# Patient Record
Sex: Male | Born: 1955
Health system: Southern US, Community
[De-identification: ages and names within clinical notes are randomized; demographics above are authoritative.]

## PROBLEM LIST (undated history)

## (undated) DIAGNOSIS — K635 Polyp of colon: Secondary | ICD-10-CM

## (undated) DIAGNOSIS — Z8489 Family history of other specified conditions: Secondary | ICD-10-CM

## (undated) DIAGNOSIS — M1990 Unspecified osteoarthritis, unspecified site: Secondary | ICD-10-CM

## (undated) DIAGNOSIS — N2889 Other specified disorders of kidney and ureter: Secondary | ICD-10-CM

## (undated) DIAGNOSIS — R319 Hematuria, unspecified: Secondary | ICD-10-CM

## (undated) DIAGNOSIS — I10 Essential (primary) hypertension: Secondary | ICD-10-CM

## (undated) DIAGNOSIS — E785 Hyperlipidemia, unspecified: Secondary | ICD-10-CM

## (undated) DIAGNOSIS — H269 Unspecified cataract: Secondary | ICD-10-CM

## (undated) DIAGNOSIS — F419 Anxiety disorder, unspecified: Secondary | ICD-10-CM

## (undated) DIAGNOSIS — L719 Rosacea, unspecified: Secondary | ICD-10-CM

## (undated) DIAGNOSIS — F32A Depression, unspecified: Secondary | ICD-10-CM

## (undated) DIAGNOSIS — K219 Gastro-esophageal reflux disease without esophagitis: Secondary | ICD-10-CM

## (undated) DIAGNOSIS — Z9889 Other specified postprocedural states: Secondary | ICD-10-CM

## (undated) DIAGNOSIS — C801 Malignant (primary) neoplasm, unspecified: Secondary | ICD-10-CM

## (undated) DIAGNOSIS — T7840XA Allergy, unspecified, initial encounter: Secondary | ICD-10-CM

## (undated) DIAGNOSIS — R112 Nausea with vomiting, unspecified: Secondary | ICD-10-CM

## (undated) DIAGNOSIS — K589 Irritable bowel syndrome without diarrhea: Secondary | ICD-10-CM

## (undated) HISTORY — DX: Rosacea, unspecified: L71.9

## (undated) HISTORY — DX: Hyperlipidemia, unspecified: E78.5

## (undated) HISTORY — DX: Other specified disorders of kidney and ureter: N28.89

## (undated) HISTORY — DX: Gastro-esophageal reflux disease without esophagitis: K21.9

## (undated) HISTORY — DX: Allergy, unspecified, initial encounter: T78.40XA

## (undated) HISTORY — DX: Polyp of colon: K63.5

## (undated) HISTORY — DX: Anxiety disorder, unspecified: F41.9

## (undated) HISTORY — DX: Irritable bowel syndrome, unspecified: K58.9

## (undated) HISTORY — DX: Unspecified cataract: H26.9

## (undated) HISTORY — DX: Hematuria, unspecified: R31.9

## (undated) HISTORY — PX: OTHER SURGICAL HISTORY: SHX169

---

## 1999-12-10 ENCOUNTER — Encounter: Admission: RE | Admit: 1999-12-10 | Discharge: 1999-12-10 | Payer: Self-pay | Admitting: Neurosurgery

## 1999-12-10 ENCOUNTER — Encounter: Payer: Self-pay | Admitting: Neurosurgery

## 2000-01-02 ENCOUNTER — Encounter: Payer: Self-pay | Admitting: Neurosurgery

## 2000-01-02 ENCOUNTER — Ambulatory Visit (HOSPITAL_COMMUNITY): Admission: RE | Admit: 2000-01-02 | Discharge: 2000-01-02 | Payer: Self-pay | Admitting: Neurosurgery

## 2003-05-31 ENCOUNTER — Emergency Department (HOSPITAL_COMMUNITY): Admission: EM | Admit: 2003-05-31 | Discharge: 2003-05-31 | Payer: Self-pay

## 2005-03-16 ENCOUNTER — Ambulatory Visit: Payer: Self-pay | Admitting: Internal Medicine

## 2005-03-23 ENCOUNTER — Ambulatory Visit: Payer: Self-pay | Admitting: Internal Medicine

## 2005-05-24 ENCOUNTER — Ambulatory Visit: Payer: Self-pay | Admitting: Internal Medicine

## 2006-06-19 ENCOUNTER — Ambulatory Visit: Payer: Self-pay | Admitting: Internal Medicine

## 2006-06-19 LAB — CONVERTED CEMR LAB
ALT: 26 units/L (ref 0–40)
AST: 26 units/L (ref 0–37)
Albumin: 4.1 g/dL (ref 3.5–5.2)
Alkaline Phosphatase: 52 units/L (ref 39–117)
BUN: 20 mg/dL (ref 6–23)
Basophils Absolute: 0 10*3/uL (ref 0.0–0.1)
Basophils Relative: 0.2 % (ref 0.0–1.0)
CO2: 26 meq/L (ref 19–32)
Calcium: 9.3 mg/dL (ref 8.4–10.5)
Chloride: 107 meq/L (ref 96–112)
Chol/HDL Ratio, serum: 4.3
Cholesterol: 187 mg/dL (ref 0–200)
Creatinine, Ser: 1 mg/dL (ref 0.4–1.5)
Eosinophil percent: 3 % (ref 0.0–5.0)
GFR calc non Af Amer: 84 mL/min
Glomerular Filtration Rate, Af Am: 101 mL/min/{1.73_m2}
Glucose, Bld: 97 mg/dL (ref 70–99)
HCT: 50.3 % (ref 39.0–52.0)
HDL: 43.7 mg/dL (ref >39.0)
Hemoglobin: 16.9 g/dL (ref 13.0–17.0)
LDL Cholesterol: 126 mg/dL — ABNORMAL HIGH (ref 0–99)
Lymphocytes Relative: 33.8 % (ref 12.0–46.0)
MCHC: 33.6 g/dL (ref 30.0–36.0)
MCV: 89.7 fL (ref 78.0–100.0)
Monocytes Absolute: 0.7 10*3/uL (ref 0.2–0.7)
Monocytes Relative: 9.6 % (ref 3.0–11.0)
Neutro Abs: 3.8 10*3/uL (ref 1.4–7.7)
Neutrophils Relative %: 53.4 % (ref 43.0–77.0)
PSA: 0.63 ng/mL (ref 0.10–4.00)
Platelets: 275 10*3/uL (ref 150–400)
Potassium: 3.3 meq/L — ABNORMAL LOW (ref 3.5–5.1)
RBC: 5.61 M/uL (ref 4.22–5.81)
RDW: 12.7 % (ref 11.5–14.6)
Sodium: 139 meq/L (ref 135–145)
TSH: 2.25 microintl units/mL (ref 0.35–5.50)
Total Bilirubin: 1.1 mg/dL (ref 0.3–1.2)
Total Protein: 6.7 g/dL (ref 6.0–8.3)
Triglyceride fasting, serum: 87 mg/dL (ref 0–149)
VLDL: 17 mg/dL (ref 0–40)
WBC: 7.1 10*3/uL (ref 4.5–10.5)

## 2006-06-29 ENCOUNTER — Ambulatory Visit: Payer: Self-pay | Admitting: Internal Medicine

## 2006-07-17 ENCOUNTER — Ambulatory Visit: Payer: Self-pay | Admitting: Gastroenterology

## 2006-07-27 LAB — HM COLONOSCOPY

## 2006-07-28 ENCOUNTER — Encounter: Payer: Self-pay | Admitting: Internal Medicine

## 2006-07-28 ENCOUNTER — Ambulatory Visit: Payer: Self-pay | Admitting: Gastroenterology

## 2006-07-28 DIAGNOSIS — K648 Other hemorrhoids: Secondary | ICD-10-CM | POA: Insufficient documentation

## 2006-07-28 HISTORY — PX: COLONOSCOPY: SHX174

## 2006-11-07 ENCOUNTER — Ambulatory Visit: Payer: Self-pay | Admitting: Internal Medicine

## 2006-11-16 ENCOUNTER — Ambulatory Visit: Payer: Self-pay | Admitting: Gastroenterology

## 2006-12-01 ENCOUNTER — Ambulatory Visit: Payer: Self-pay | Admitting: Internal Medicine

## 2007-01-01 ENCOUNTER — Ambulatory Visit: Payer: Self-pay | Admitting: Gastroenterology

## 2007-10-19 ENCOUNTER — Ambulatory Visit: Payer: Self-pay | Admitting: Internal Medicine

## 2007-10-19 LAB — CONVERTED CEMR LAB
ALT: 15 units/L (ref 0–53)
AST: 19 units/L (ref 0–37)
Albumin: 4.4 g/dL (ref 3.5–5.2)
Alkaline Phosphatase: 60 units/L (ref 39–117)
BUN: 19 mg/dL (ref 6–23)
Basophils Absolute: 0.1 10*3/uL (ref 0.0–0.1)
Basophils Relative: 0.9 % (ref 0.0–1.0)
Bilirubin Urine: NEGATIVE
Bilirubin, Direct: 0.3 mg/dL (ref 0.0–0.3)
CO2: 27 meq/L (ref 19–32)
Calcium: 10.2 mg/dL (ref 8.4–10.5)
Chloride: 105 meq/L (ref 96–112)
Cholesterol: 195 mg/dL (ref 0–200)
Creatinine, Ser: 1 mg/dL (ref 0.4–1.5)
Eosinophils Absolute: 0.1 10*3/uL (ref 0.0–0.6)
Eosinophils Relative: 1.3 % (ref 0.0–5.0)
GFR calc Af Amer: 101 mL/min
GFR calc non Af Amer: 83 mL/min
Glucose, Bld: 99 mg/dL (ref 70–99)
Glucose, Urine, Semiquant: NEGATIVE
HCT: 52.9 % — ABNORMAL HIGH (ref 39.0–52.0)
HDL: 41.5 mg/dL (ref >39.0)
Hemoglobin: 17.5 g/dL — ABNORMAL HIGH (ref 13.0–17.0)
Ketones, urine, test strip: NEGATIVE
LDL Cholesterol: 133 mg/dL — ABNORMAL HIGH (ref 0–99)
Lymphocytes Relative: 33.2 % (ref 12.0–46.0)
MCHC: 33.1 g/dL (ref 30.0–36.0)
MCV: 89.6 fL (ref 78.0–100.0)
Monocytes Absolute: 0.7 10*3/uL (ref 0.2–0.7)
Monocytes Relative: 8.8 % (ref 3.0–11.0)
Neutro Abs: 4.5 10*3/uL (ref 1.4–7.7)
Neutrophils Relative %: 55.8 % (ref 43.0–77.0)
Nitrite: NEGATIVE
PSA: 0.56 ng/mL (ref 0.10–4.00)
Platelets: 252 10*3/uL (ref 150–400)
Potassium: 4.4 meq/L (ref 3.5–5.1)
Protein, U semiquant: NEGATIVE
RBC: 5.9 M/uL — ABNORMAL HIGH (ref 4.22–5.81)
RDW: 12.7 % (ref 11.5–14.6)
Sodium: 137 meq/L (ref 135–145)
Specific Gravity, Urine: 1.02
TSH: 2.23 microintl units/mL (ref 0.35–5.50)
Total Bilirubin: 0.9 mg/dL (ref 0.3–1.2)
Total CHOL/HDL Ratio: 4.7
Total Protein: 6.5 g/dL (ref 6.0–8.3)
Triglycerides: 104 mg/dL (ref 0–149)
Urobilinogen, UA: 0.2
VLDL: 21 mg/dL (ref 0–40)
WBC Urine, dipstick: NEGATIVE
WBC: 8.1 10*3/uL (ref 4.5–10.5)
pH: 5.5

## 2007-10-26 ENCOUNTER — Ambulatory Visit: Payer: Self-pay | Admitting: Internal Medicine

## 2007-10-26 DIAGNOSIS — B079 Viral wart, unspecified: Secondary | ICD-10-CM | POA: Insufficient documentation

## 2007-10-26 DIAGNOSIS — E785 Hyperlipidemia, unspecified: Secondary | ICD-10-CM | POA: Insufficient documentation

## 2007-10-26 DIAGNOSIS — L719 Rosacea, unspecified: Secondary | ICD-10-CM | POA: Insufficient documentation

## 2007-10-26 LAB — CONVERTED CEMR LAB
Cholesterol, target level: 200 mg/dL
HDL goal, serum: 40 mg/dL
LDL Goal: 160 mg/dL

## 2007-12-27 DIAGNOSIS — F411 Generalized anxiety disorder: Secondary | ICD-10-CM | POA: Insufficient documentation

## 2007-12-27 DIAGNOSIS — K219 Gastro-esophageal reflux disease without esophagitis: Secondary | ICD-10-CM | POA: Insufficient documentation

## 2007-12-27 DIAGNOSIS — J45909 Unspecified asthma, uncomplicated: Secondary | ICD-10-CM | POA: Insufficient documentation

## 2008-01-25 ENCOUNTER — Ambulatory Visit: Payer: Self-pay | Admitting: Internal Medicine

## 2008-01-25 DIAGNOSIS — T887XXA Unspecified adverse effect of drug or medicament, initial encounter: Secondary | ICD-10-CM | POA: Insufficient documentation

## 2008-01-25 LAB — CONVERTED CEMR LAB
ALT: 17 units/L (ref 0–53)
AST: 20 units/L (ref 0–37)
Albumin: 4 g/dL (ref 3.5–5.2)
Alkaline Phosphatase: 49 units/L (ref 39–117)
Bilirubin, Direct: 0.1 mg/dL (ref 0.0–0.3)
Cholesterol: 182 mg/dL (ref 0–200)
HDL: 39.3 mg/dL (ref >39.0)
LDL Cholesterol: 127 mg/dL — ABNORMAL HIGH (ref 0–99)
Total Bilirubin: 1 mg/dL (ref 0.3–1.2)
Total CHOL/HDL Ratio: 4.6
Total Protein: 6.5 g/dL (ref 6.0–8.3)
Triglycerides: 78 mg/dL (ref 0–149)
VLDL: 16 mg/dL (ref 0–40)

## 2008-01-28 ENCOUNTER — Ambulatory Visit: Payer: Self-pay | Admitting: Internal Medicine

## 2008-03-07 ENCOUNTER — Telehealth: Payer: Self-pay | Admitting: Gastroenterology

## 2008-04-16 DIAGNOSIS — Z86010 Personal history of colonic polyps: Secondary | ICD-10-CM | POA: Insufficient documentation

## 2008-04-17 ENCOUNTER — Ambulatory Visit: Payer: Self-pay | Admitting: Gastroenterology

## 2008-04-17 DIAGNOSIS — K644 Residual hemorrhoidal skin tags: Secondary | ICD-10-CM | POA: Insufficient documentation

## 2008-04-17 DIAGNOSIS — K589 Irritable bowel syndrome without diarrhea: Secondary | ICD-10-CM | POA: Insufficient documentation

## 2008-05-16 ENCOUNTER — Telehealth: Payer: Self-pay | Admitting: Internal Medicine

## 2008-05-21 ENCOUNTER — Telehealth: Payer: Self-pay | Admitting: Internal Medicine

## 2008-10-22 ENCOUNTER — Encounter: Payer: Self-pay | Admitting: Internal Medicine

## 2008-10-22 ENCOUNTER — Encounter: Admission: RE | Admit: 2008-10-22 | Discharge: 2008-10-22 | Payer: Self-pay | Admitting: Neurosurgery

## 2008-10-23 ENCOUNTER — Encounter: Payer: Self-pay | Admitting: Internal Medicine

## 2008-11-10 ENCOUNTER — Telehealth: Payer: Self-pay | Admitting: Internal Medicine

## 2008-11-17 ENCOUNTER — Telehealth (INDEPENDENT_AMBULATORY_CARE_PROVIDER_SITE_OTHER): Payer: Self-pay | Admitting: *Deleted

## 2008-11-18 ENCOUNTER — Ambulatory Visit: Payer: Self-pay | Admitting: Internal Medicine

## 2008-11-18 LAB — CONVERTED CEMR LAB
Bilirubin Urine: NEGATIVE
Glucose, Urine, Semiquant: NEGATIVE
Ketones, urine, test strip: NEGATIVE
Nitrite: NEGATIVE
Protein, U semiquant: NEGATIVE
Specific Gravity, Urine: 1.015
Urobilinogen, UA: 0.2
WBC Urine, dipstick: NEGATIVE
pH: 6

## 2008-12-02 ENCOUNTER — Ambulatory Visit: Payer: Self-pay | Admitting: Internal Medicine

## 2008-12-02 LAB — CONVERTED CEMR LAB
ALT: 27 units/L (ref 0–53)
AST: 22 units/L (ref 0–37)
Albumin: 4.3 g/dL (ref 3.5–5.2)
Alkaline Phosphatase: 65 units/L (ref 39–117)
BUN: 17 mg/dL (ref 6–23)
Basophils Absolute: 0 10*3/uL (ref 0.0–0.1)
Basophils Relative: 0.1 % (ref 0.0–3.0)
Bilirubin, Direct: 0.1 mg/dL (ref 0.0–0.3)
CO2: 26 meq/L (ref 19–32)
Calcium: 10 mg/dL (ref 8.4–10.5)
Chloride: 108 meq/L (ref 96–112)
Cholesterol: 230 mg/dL — ABNORMAL HIGH (ref 0–200)
Creatinine, Ser: 0.9 mg/dL (ref 0.4–1.5)
Direct LDL: 152.5 mg/dL
Eosinophils Absolute: 0 10*3/uL (ref 0.0–0.7)
Eosinophils Relative: 0.3 % (ref 0.0–5.0)
GFR calc non Af Amer: 93.51 mL/min (ref >60)
Glucose, Bld: 105 mg/dL — ABNORMAL HIGH (ref 70–99)
HCT: 52 % (ref 39.0–52.0)
HDL: 51.5 mg/dL (ref >39.00)
Hemoglobin: 18.1 g/dL — ABNORMAL HIGH (ref 13.0–17.0)
Lymphocytes Relative: 18.1 % (ref 12.0–46.0)
Lymphs Abs: 2.4 10*3/uL (ref 0.7–4.0)
MCHC: 34.9 g/dL (ref 30.0–36.0)
MCV: 88.9 fL (ref 78.0–100.0)
Monocytes Absolute: 0.5 10*3/uL (ref 0.1–1.0)
Monocytes Relative: 3.8 % (ref 3.0–12.0)
Neutro Abs: 10.1 10*3/uL — ABNORMAL HIGH (ref 1.4–7.7)
Neutrophils Relative %: 77.7 % — ABNORMAL HIGH (ref 43.0–77.0)
PSA: 0.7 ng/mL (ref 0.10–4.00)
Platelets: 262 10*3/uL (ref 150.0–400.0)
Potassium: 3.6 meq/L (ref 3.5–5.1)
RBC: 5.85 M/uL — ABNORMAL HIGH (ref 4.22–5.81)
RDW: 12.4 % (ref 11.5–14.6)
Sodium: 141 meq/L (ref 135–145)
TSH: 1.51 microintl units/mL (ref 0.35–5.50)
Total Bilirubin: 0.9 mg/dL (ref 0.3–1.2)
Total CHOL/HDL Ratio: 4
Total Protein: 7.8 g/dL (ref 6.0–8.3)
Triglycerides: 55 mg/dL (ref 0.0–149.0)
VLDL: 11 mg/dL (ref 0.0–40.0)
WBC: 13 10*3/uL — ABNORMAL HIGH (ref 4.5–10.5)

## 2009-03-03 ENCOUNTER — Ambulatory Visit: Payer: Self-pay | Admitting: Internal Medicine

## 2009-03-03 LAB — CONVERTED CEMR LAB
ALT: 15 units/L (ref 0–53)
AST: 18 units/L (ref 0–37)
Albumin: 4 g/dL (ref 3.5–5.2)
Alkaline Phosphatase: 49 units/L (ref 39–117)
Bilirubin, Direct: 0.1 mg/dL (ref 0.0–0.3)
Cholesterol: 142 mg/dL (ref 0–200)
HDL: 37.5 mg/dL — ABNORMAL LOW (ref >39.00)
LDL Cholesterol: 83 mg/dL (ref 0–99)
Total Bilirubin: 1 mg/dL (ref 0.3–1.2)
Total CHOL/HDL Ratio: 4
Total Protein: 6.7 g/dL (ref 6.0–8.3)
Triglycerides: 109 mg/dL (ref 0.0–149.0)
VLDL: 21.8 mg/dL (ref 0.0–40.0)

## 2009-03-10 ENCOUNTER — Ambulatory Visit: Payer: Self-pay | Admitting: Internal Medicine

## 2009-09-21 ENCOUNTER — Telehealth: Payer: Self-pay | Admitting: Gastroenterology

## 2009-10-22 ENCOUNTER — Ambulatory Visit: Payer: Self-pay | Admitting: Gastroenterology

## 2009-11-24 ENCOUNTER — Telehealth: Payer: Self-pay | Admitting: Physician Assistant

## 2009-11-26 ENCOUNTER — Telehealth: Payer: Self-pay | Admitting: Internal Medicine

## 2009-12-09 ENCOUNTER — Ambulatory Visit: Payer: Self-pay | Admitting: Internal Medicine

## 2009-12-09 LAB — CONVERTED CEMR LAB
ALT: 20 units/L (ref 0–53)
AST: 23 units/L (ref 0–37)
Albumin: 4.4 g/dL (ref 3.5–5.2)
Alkaline Phosphatase: 48 units/L (ref 39–117)
BUN: 16 mg/dL (ref 6–23)
Basophils Absolute: 0 10*3/uL (ref 0.0–0.1)
Basophils Relative: 0.6 % (ref 0.0–3.0)
Bilirubin Urine: NEGATIVE
Bilirubin, Direct: 0.1 mg/dL (ref 0.0–0.3)
CO2: 27 meq/L (ref 19–32)
Calcium: 9.9 mg/dL (ref 8.4–10.5)
Chloride: 104 meq/L (ref 96–112)
Cholesterol: 154 mg/dL (ref 0–200)
Creatinine, Ser: 1 mg/dL (ref 0.4–1.5)
Eosinophils Absolute: 0.1 10*3/uL (ref 0.0–0.7)
Eosinophils Relative: 1.7 % (ref 0.0–5.0)
GFR calc non Af Amer: 83.44 mL/min (ref >60)
Glucose, Bld: 85 mg/dL (ref 70–99)
Glucose, Urine, Semiquant: NEGATIVE
HCT: 49.3 % (ref 39.0–52.0)
HDL: 48.1 mg/dL (ref >39.00)
Hemoglobin: 16.7 g/dL (ref 13.0–17.0)
Ketones, urine, test strip: NEGATIVE
LDL Cholesterol: 93 mg/dL (ref 0–99)
Lymphocytes Relative: 34.6 % (ref 12.0–46.0)
Lymphs Abs: 2.4 10*3/uL (ref 0.7–4.0)
MCHC: 33.9 g/dL (ref 30.0–36.0)
MCV: 91 fL (ref 78.0–100.0)
Monocytes Absolute: 0.6 10*3/uL (ref 0.1–1.0)
Monocytes Relative: 8.5 % (ref 3.0–12.0)
Neutro Abs: 3.8 10*3/uL (ref 1.4–7.7)
Neutrophils Relative %: 54.6 % (ref 43.0–77.0)
Nitrite: NEGATIVE
PSA: 0.71 ng/mL (ref 0.10–4.00)
Platelets: 263 10*3/uL (ref 150.0–400.0)
Potassium: 3.8 meq/L (ref 3.5–5.1)
Protein, U semiquant: NEGATIVE
RBC: 5.42 M/uL (ref 4.22–5.81)
RDW: 13.7 % (ref 11.5–14.6)
Sodium: 142 meq/L (ref 135–145)
Specific Gravity, Urine: 1.015
TSH: 1.66 microintl units/mL (ref 0.35–5.50)
Total Bilirubin: 0.9 mg/dL (ref 0.3–1.2)
Total CHOL/HDL Ratio: 3
Total Protein: 7 g/dL (ref 6.0–8.3)
Triglycerides: 65 mg/dL (ref 0.0–149.0)
Urobilinogen, UA: 0.2
VLDL: 13 mg/dL (ref 0.0–40.0)
WBC Urine, dipstick: NEGATIVE
WBC: 6.9 10*3/uL (ref 4.5–10.5)
pH: 5.5

## 2009-12-16 ENCOUNTER — Ambulatory Visit: Payer: Self-pay | Admitting: Internal Medicine

## 2010-01-27 ENCOUNTER — Telehealth: Payer: Self-pay | Admitting: Internal Medicine

## 2010-03-15 ENCOUNTER — Telehealth: Payer: Self-pay | Admitting: Internal Medicine

## 2010-04-20 ENCOUNTER — Ambulatory Visit: Payer: Self-pay | Admitting: Internal Medicine

## 2010-05-04 DIAGNOSIS — R030 Elevated blood-pressure reading, without diagnosis of hypertension: Secondary | ICD-10-CM | POA: Insufficient documentation

## 2010-09-02 NOTE — Assessment & Plan Note (Signed)
Summary: 3 month fup//ccm/pt rescd//ccm   Vital Signs:  Patient profile:   55 year old male Height:      69 inches Weight:      158 pounds BMI:     23.42 Temp:     98.2 degrees F oral Pulse rate:   84 / minute Resp:     14 per minute BP sitting:   136 / 80  (left arm)  Vitals Entered By: Willy Eddy, LPN (April 20, 2010 12:04 PM) CC: roa Is Patient Diabetic? No   Primary Care Illianna Paschal:  Darryll Capers MD  CC:  roa.  History of Present Illness: the anal pram works for the hemorhoids the pt has acute allergic rhinitis with sinus congestion he has used saline and mucinex the pt has used amitiza effectively for IBS  palpitations are rare as well ans no recurrent chest pain stress management better hs of stress related elevatiosn of blood pressure  Preventive Screening-Counseling & Management  Alcohol-Tobacco     Smoking Status: never     Tobacco Counseling: not indicated; no tobacco use  Problems Prior to Update: 1)  Screening Colorectal-cancer  (ICD-V76.51) 2)  Irritable Bowel Syndrome  (ICD-564.1) 3)  Hemorrhoids-external  (ICD-455.3) 4)  Hyperlipidemia  (ICD-272.4) 5)  Uns Advrs Eff Uns Rx Medicinal&biological Sbstnc  (ICD-995.20) 6)  Colonic Polyps, Hx of  (ICD-V12.72) 7)  Internal Hemorrhoids  (ICD-455.0) 8)  Gerd  (ICD-530.81) 9)  Anxiety  (ICD-300.00) 10)  Asthma  (ICD-493.90) 11)  Wart, Left Hand  (ICD-078.10) 12)  Family History of Cad Male 1st Degree Relative <50  (ICD-V17.3) 13)  Hyperlipidemia  (ICD-272.4) 14)  Rosacea  (ICD-695.3) 15)  Physical Examination  (ICD-V70.0)  Current Problems (verified): 1)  Screening Colorectal-cancer  (ICD-V76.51) 2)  Irritable Bowel Syndrome  (ICD-564.1) 3)  Hemorrhoids-external  (ICD-455.3) 4)  Hyperlipidemia  (ICD-272.4) 5)  Uns Advrs Eff Uns Rx Medicinal&biological Sbstnc  (ICD-995.20) 6)  Colonic Polyps, Hx of  (ICD-V12.72) 7)  Internal Hemorrhoids  (ICD-455.0) 8)  Gerd  (ICD-530.81) 9)  Anxiety   (ICD-300.00) 10)  Asthma  (ICD-493.90) 11)  Wart, Left Hand  (ICD-078.10) 12)  Family History of Cad Male 1st Degree Relative <50  (ICD-V17.3) 13)  Hyperlipidemia  (ICD-272.4) 14)  Rosacea  (ICD-695.3) 15)  Physical Examination  (ICD-V70.0)  Medications Prior to Update: 1)  Adult Aspirin Low Strength 81 Mg  Tbdp (Aspirin) .... Once Daily 2)  Citrucel 500 Mg  Tabs (Methylcellulose (Laxative)) .... Two Times A Day 3)  Multivitamins   Caps (Multiple Vitamin) .... Once Daily 4)  Minocycline Hcl 100 Mg  Caps (Minocycline Hcl) .... Once Daily As Needed 5)  Elidel 1 %  Crea (Pimecrolimus) .... As Needed 6)  Fish Oil Concentrate 1000 Mg  Caps (Omega-3 Fatty Acids) .... 2 By Mouth Once Daily 7)  Canasa 1000 Mg  Supp (Mesalamine) .... One Suppository Into Rectum At Bedtime As Needed 8)  Analpram-Hc 1-2.5 % Crea (Hydrocortisone Ace-Pramoxine) .... Apply To Rectum Three Times A Day As Needed 9)  Robinul-Forte 2 Mg Tabs (Glycopyrrolate) .Marland Kitchen.. 1 Tab By Mouth Two Times A Day As Needed 10)  Alprazolam 0.25 Mg Tabs (Alprazolam) .... One Tab By Mouth Every 6 Hours As Needed 11)  Crestor 20 Mg Tabs (Rosuvastatin Calcium) .... One By Mouth Every Friday 12)  Amitiza 8 Mcg Caps (Lubiprostone) .... One By Mouth Daily  Current Medications (verified): 1)  Adult Aspirin Low Strength 81 Mg  Tbdp (Aspirin) .... Once Daily 2)  Citrucel 500 Mg  Tabs (Methylcellulose (Laxative)) .... Two Times A Day 3)  Multivitamins   Caps (Multiple Vitamin) .... Once Daily 4)  Minocycline Hcl 100 Mg  Caps (Minocycline Hcl) .... Once Daily As Needed 5)  Elidel 1 %  Crea (Pimecrolimus) .... As Needed 6)  Fish Oil Concentrate 1000 Mg  Caps (Omega-3 Fatty Acids) .... 2 By Mouth Once Daily 7)  Canasa 1000 Mg  Supp (Mesalamine) .... One Suppository Into Rectum At Bedtime As Needed 8)  Analpram-Hc 1-2.5 % Crea (Hydrocortisone Ace-Pramoxine) .... Apply To Rectum Three Times A Day As Needed 9)  Robinul-Forte 2 Mg Tabs (Glycopyrrolate) .Marland Kitchen..  1 Tab By Mouth Two Times A Day As Needed 10)  Alprazolam 0.25 Mg Tabs (Alprazolam) .... One Tab By Mouth Every 6 Hours As Needed 11)  Crestor 20 Mg Tabs (Rosuvastatin Calcium) .... One By Mouth Every Friday 12)  Amitiza 8 Mcg Caps (Lubiprostone) .... One By Mouth Daily 13)  Align 4 Mg Caps (Probiotic Product) .Marland Kitchen.. 1 Once Daily 14)  Lodrane 12d 6-45 Mg Xr12h-Tab (Brompheniramine-Pseudoeph) .Marland Kitchen.. 1 Two Times A Day  Allergies (verified): 1)  ! Pcn  Past History:  Family History: Last updated: 10/22/2009 Family History of CAD Male 1st degree relative <50 Family History of Stomach Cancer:Maternal Uncles x 2  No FH of Colon Cancer:  Social History: Last updated: 10/22/2009 Married Never Smoked Alcohol use-no Drug use-no Regular exercise-yes Daily Caffeine Use  Risk Factors: Exercise: yes (01/28/2008)  Risk Factors: Smoking Status: never (04/20/2010)  Past medical, surgical, family and social histories (including risk factors) reviewed, and no changes noted (except as noted below).  Past Medical History: Reviewed history from 04/17/2008 and no changes required. HYPERPLASTC COLONIC POLYPS  INTERNAL and EXTERNAL HEMORRHOIDS GERD (ICD-530.81) ANXIETY (ICD-300.00) ASTHMA (ICD-493.90) WART, LEFT HAND (ICD-078.10) HYPERLIPIDEMIA (ICD-272.4) ROSACEA (ICD-695.3)  Past Surgical History: Reviewed history from 04/17/2008 and no changes required. Unremarkable  Family History: Reviewed history from 10/22/2009 and no changes required. Family History of CAD Male 1st degree relative <50 Family History of Stomach Cancer:Maternal Uncles x 2  No FH of Colon Cancer:  Social History: Reviewed history from 10/22/2009 and no changes required. Married Never Smoked Alcohol use-no Drug use-no Regular exercise-yes Daily Caffeine Use  Review of Systems  The patient denies anorexia, fever, weight loss, weight gain, vision loss, decreased hearing, hoarseness, chest pain, syncope, dyspnea  on exertion, peripheral edema, prolonged cough, headaches, hemoptysis, abdominal pain, melena, hematochezia, severe indigestion/heartburn, hematuria, incontinence, genital sores, muscle weakness, suspicious skin lesions, transient blindness, difficulty walking, depression, unusual weight change, abnormal bleeding, enlarged lymph nodes, angioedema, breast masses, and testicular masses.    Physical Exam  General:  Well-developed,well-nourished,in no acute distress; alert,appropriate and cooperative throughout examination Head:  Normocephalic and atraumatic. Ears:  R ear normal and L ear normal.   Neck:  No deformities, masses, or tenderness noted. Lungs:  Clear throughout to auscultation. Heart:  Regular rate and rhythm; no murmurs, rubs,  or bruits. Abdomen:  Soft, nontender and nondistended. No masses, hepatosplenomegaly or hernias noted. Normal bowel sounds. Neurologic:  Alert and  oriented x4;  grossly normal neurologically. Psych:  Oriented X3 and slightly anxious.     Impression & Recommendations:  Problem # 1:  ANXIETY (ICD-300.00) Assessment Unchanged  His updated medication list for this problem includes:    Alprazolam 0.25 Mg Tabs (Alprazolam) ..... One tab by mouth every 6 hours as needed  Discussed medication use and relaxation techniques.   Problem # 2:  HYPERLIPIDEMIA (ICD-272.4)  Assessment: Unchanged  His updated medication list for this problem includes:    Crestor 20 Mg Tabs (Rosuvastatin calcium) ..... One by mouth every friday  Labs Reviewed: SGOT: 23 (12/09/2009)   SGPT: 20 (12/09/2009)  Lipid Goals: Chol Goal: 200 (10/26/2007)   HDL Goal: 40 (10/26/2007)   LDL Goal: 160 (10/26/2007)   TG Goal: 150 (10/26/2007)  Prior 10 Yr Risk Heart Disease: 4 % (12/16/2009)   HDL:48.10 (12/09/2009), 37.50 (03/03/2009)  LDL:93 (12/09/2009), 83 (03/03/2009)  Chol:154 (12/09/2009), 142 (03/03/2009)  Trig:65.0 (12/09/2009), 109.0 (03/03/2009)  Problem # 3:  ELEVATED BP READING  WITHOUT DX HYPERTENSION (ICD-796.2) related to anxiety relaxations techniques reviewed  Problem # 4:  IRRITABLE BOWEL SYNDROME (ICD-564.1) stable  Complete Medication List: 1)  Adult Aspirin Low Strength 81 Mg Tbdp (Aspirin) .... Once daily 2)  Citrucel 500 Mg Tabs (Methylcellulose (laxative)) .... Two times a day 3)  Multivitamins Caps (Multiple vitamin) .... Once daily 4)  Minocycline Hcl 100 Mg Caps (Minocycline hcl) .... Once daily as needed 5)  Elidel 1 % Crea (Pimecrolimus) .... As needed 6)  Fish Oil Concentrate 1000 Mg Caps (Omega-3 fatty acids) .... 2 by mouth once daily 7)  Canasa 1000 Mg Supp (Mesalamine) .... One suppository into rectum at bedtime as needed 8)  Analpram-hc 1-2.5 % Crea (Hydrocortisone ace-pramoxine) .... Apply to rectum three times a day as needed 9)  Robinul-forte 2 Mg Tabs (Glycopyrrolate) .Marland Kitchen.. 1 tab by mouth two times a day as needed 10)  Alprazolam 0.25 Mg Tabs (Alprazolam) .... One tab by mouth every 6 hours as needed 11)  Crestor 20 Mg Tabs (Rosuvastatin calcium) .... One by mouth every friday 12)  Amitiza 8 Mcg Caps (Lubiprostone) .... One by mouth daily 13)  Align 4 Mg Caps (Probiotic product) .Marland Kitchen.. 1 once daily 14)  Lodrane 12d 6-45 Mg Xr12h-tab (Brompheniramine-pseudoeph) .Marland Kitchen.. 1 two times a day  Patient Instructions: 1)  may CPX Prescriptions: LODRANE 12D 6-45 MG XR12H-TAB (BROMPHENIRAMINE-PSEUDOEPH) 1 two times a day  #60 x 6   Entered by:   Willy Eddy, LPN   Authorized by:   Stacie Glaze MD   Signed by:   Willy Eddy, LPN on 16/05/9603   Method used:   Electronically to        [facility] [phone]* (retail)       [address]       [place], [place]  [address]       Ph: [phone]       Fax: [phone]   RxID:   [id number] LODRANE 12D 6-45 MG XR12H-TAB (BROMPHENIRAMINE-PSEUDOEPH) 1 two times a day  #60 x 1   Entered by:   Willy Eddy, LPN   Authorized by:   Stacie Glaze MD    Signed by:   Stacie Glaze MD on 04/20/2010   Method used:   Electronically to        [facility] [phone]* (retail)       [address]       [place], [place]  [address]       Ph: [phone]       Fax: [phone]   RxID:   [id number] AMITIZA 8 MCG CAPS (LUBIPROSTONE) one by mouth daily  #30 x 11   Entered and Authorized by:   Stacie Glaze MD   Signed by:   Jonny Ruiz  Carolynn Sayers MD on 04/20/2010   Method used:   Electronically to        [facility] [phone]* (retail)       [address]       [place], [place]  [address]       Ph: [phone]       Fax: [phone]   RxID:   [id number] ANALPRAM-HC 1-2.5 % CREA (HYDROCORTISONE ACE-PRAMOXINE) apply to rectum three times a day as needed  #30 x 11   Entered and Authorized by:   Stacie Glaze MD   Signed by:   Stacie Glaze MD on 04/20/2010   Method used:   Electronically to        [facility] [phone]* (retail)       [address]       [place], [place]  [address]       Ph: [phone]       Fax: [phone]   RxID:   [id number]

## 2010-09-02 NOTE — Progress Notes (Signed)
Summary: please return call  Phone Note Call from Patient Call back at Work Phone [phone]   Caller: Patient---live call Summary of Call: wants Bonnye to return call.  Initial call taken by: Warnell Forester,  March 15, 2010 1:16 PM    Prescriptions: AMITIZA 8 MCG CAPS (LUBIPROSTONE) one by mouth daily  #30 x 3   Entered by:   Willy Eddy, LPN   Authorized by:   Stacie Glaze MD   Signed by:   Willy Eddy, LPN on 78/29/5621   Method used:   Electronically to        [facility] [phone]* (retail)       [address]       [place], [place]  [address]       Ph: [phone]       Fax: [phone]   RxID:   [id number]

## 2010-09-02 NOTE — Progress Notes (Signed)
Summary: Medication refill   Phone Note Call from Patient Call back at Home Phone [phone] Call back at cell [phone]   Caller: Patient Call For: Dr. Russella Dar Reason for Call: Refill Medication Summary of Call: Needs refill of Canasa 1000MG  Suppositories and Xanax...Marland Kitchenhe is getting ready to fly Initial call taken by: Karna Christmas,  November 24, 2009 1:01 PM  Follow-up for Phone Call        left message for pt  to call back  Follow-up by: Christie Nottingham CMA Duncan Dull),  November 24, 2009 1:44 PM  Additional Follow-up for Phone Call Additional follow up Details #1::        left message for pt  to call back  Additional Follow-up by: Christie Nottingham CMA Duncan Dull),  November 25, 2009 8:43 AM    Additional Follow-up for Phone Call Additional follow up Details #2::    Pt states we have prescribed Canasa in the past for hemorrhoids and would like some before he goes out of town. Pt would also like a refill on his Xanax and I informed him Dr. Russella Dar is not here this week and he needs to go to his PCP for those refills. Deshun Sedivy can we give hime some Canasa suppositories? Follow-up by: Christie Nottingham CMA Duncan Dull),  November 25, 2009 2:24 PM  Additional Follow-up for Phone Call Additional follow up Details #3:: Details for Additional Follow-up Action Taken: OK TO REFILL CANASA SUPP ONE P.R. at bedtime ,ONE MONTH SUPPLY,ONE REFILL. Additional Follow-up by: Peterson Ao,  November 25, 2009 3:09 PM  Prescriptions: CANASA 1000 MG  SUPP (MESALAMINE) one suppository into rectum at bedtime as needed  #30 x 1   Entered by:   Christie Nottingham CMA (AAMA)   Authorized by:   Meryl Dare MD [place]   Signed by:   Sammuel Cooper PA-c on 11/25/2009   Method used:   Electronically to        [facility] [phone]* (retail)       [address]       [place], [place]  [address]       Ph: [phone]       Fax: [phone]   RxID:   [id number]

## 2010-09-02 NOTE — Progress Notes (Signed)
  Phone Note Call from Patient   Caller: Patient Call For: Stacie Glaze MD Summary of Call: Needs Amitiza called to [facility].  [phone]   Initial call taken by: Lynann Beaver CMA,  January 27, 2010 11:43 AM    Prescriptions: AMITIZA 8 MCG CAPS (LUBIPROSTONE) one by mouth daily  #30 x 3   Entered by:   Willy Eddy, LPN   Authorized by:   Stacie Glaze MD   Signed by:   Willy Eddy, LPN on 45/40/9811   Method used:   Electronically to        [facility] [phone]* (retail)       [address]       [place], [place]  [address]       Ph: [phone]       Fax: [phone]   RxID:   [id number]

## 2010-09-02 NOTE — Assessment & Plan Note (Signed)
Summary: IBS f/u and med refills/all   History of Present Illness Visit Type: Follow-up Visit Primary GI MD: Elie Goody MD [facility] Primary Provider: Darryll Capers MD Chief Complaint: follow-up IBS and needs refill of meds History of Present Illness:   This is a return office visit for irritable bowel syndrome. Dr. [name] relates occasional problems with diarrhea and mild abdominal cramping, that responds well to glycopyrrolate. He has not had problems with his hemorrhoids for several years.   GI Review of Systems      Denies abdominal pain, acid reflux, belching, bloating, chest pain, dysphagia with liquids, dysphagia with solids, heartburn, loss of appetite, nausea, vomiting, vomiting blood, weight loss, and  weight gain.        Denies anal fissure, black tarry stools, change in bowel habit, constipation, diarrhea, diverticulosis, fecal incontinence, heme positive stool, hemorrhoids, irritable bowel syndrome, jaundice, light color stool, liver problems, rectal bleeding, and  rectal pain.   Current Medications (verified): 1)  Adult Aspirin Low Strength 81 Mg  Tbdp (Aspirin) .... Once Daily 2)  Citrucel 500 Mg  Tabs (Methylcellulose (Laxative)) .... Two Times A Day 3)  Multivitamins   Caps (Multiple Vitamin) .... Once Daily 4)  Minocycline Hcl 100 Mg  Caps (Minocycline Hcl) .... Once Daily As Needed 5)  Elidel 1 %  Crea (Pimecrolimus) .... As Needed 6)  Fish Oil Concentrate 1000 Mg  Caps (Omega-3 Fatty Acids) .... 2 By Mouth Once Daily 7)  Canasa 1000 Mg  Supp (Mesalamine) .... One Suppository Into Rectum At Bedtime As Needed 8)  Analpram-Hc 1-2.5 % Crea (Hydrocortisone Ace-Pramoxine) .... Apply To Rectum Three Times A Day As Needed 9)  Robinul-Forte 2 Mg Tabs (Glycopyrrolate) .Marland Kitchen.. 1 Tab By Mouth Two Times A Day As Needed 10)  Alprazolam 0.25 Mg Tabs (Alprazolam) .... One Tab By Mouth Every 6 Hours As Needed 11)  Nabumetone 500 Mg Tabs (Nabumetone) .Marland Kitchen.. 1 Once Daily As Needed 12)   Crestor 20 Mg Tabs (Rosuvastatin Calcium) .... One By Mouth Every Friday  Allergies (verified): 1)  ! Pcn  Past History:  Past Medical History: Reviewed history from 04/17/2008 and no changes required. HYPERPLASTC COLONIC POLYPS  INTERNAL and EXTERNAL HEMORRHOIDS GERD (ICD-530.81) ANXIETY (ICD-300.00) ASTHMA (ICD-493.90) WART, LEFT HAND (ICD-078.10) HYPERLIPIDEMIA (ICD-272.4) ROSACEA (ICD-695.3)  Past Surgical History: Reviewed history from 04/17/2008 and no changes required. Unremarkable  Family History: Reviewed history from 10/26/2007 and no changes required. Family History of CAD Male 1st degree relative <50 Family History of Stomach Cancer:Maternal Uncles x 2  No FH of Colon Cancer:  Social History: Reviewed history from 01/28/2008 and no changes required. Married Never Smoked Alcohol use-no Drug use-no Regular exercise-yes Daily Caffeine Use  Review of Systems       The patient complains of sleeping problems.         The pertinent positives and negatives are noted as above and in the HPI. All other ROS were reviewed and were negative.   Vital Signs:  Patient profile:   55 year old male Height:      69 inches Weight:      158 pounds BMI:     23.42 Pulse rate:   84 / minute Pulse rhythm:   regular BP sitting:   144 / 100  (left arm)  Vitals Entered By: Milford Cage NCMA (October 22, 2009 8:52 AM)  Physical Exam  General:  Well developed, well nourished, no acute distress. Head:  Normocephalic and atraumatic. Eyes:  PERRLA, no icterus. Mouth:  No deformity or lesions, dentition normal. Lungs:  Clear throughout to auscultation. Heart:  Regular rate and rhythm; no murmurs, rubs,  or bruits. Abdomen:  Soft, nontender and nondistended. No masses, hepatosplenomegaly or hernias noted. Normal bowel sounds. Psych:  Alert and cooperative. Normal mood and affect.  Impression & Recommendations:  Problem # 1:  IRRITABLE BOWEL SYNDROME (ICD-564.1) Mild  irritable bowel syndrome. Continue glycopyrrolate 2 mg b.i.d. p.r.n. If his symptoms substantially change he is advised to return for followup otherwise wtih me, otherwise followup with Dr. Lovell Sheehan for ongoing care.  Problem # 2:  SCREENING COLORECTAL-CANCER (ICD-V76.51) Average risk for colorectal cancer. Screening colonoscopy recommended December 2017.  Patient Instructions: 1)  Pick up your prescription from your pharmacy.  2)  Please continue current medications.  3)  Please schedule a follow-up appointment as needed.  4)  The medication list was reviewed and reconciled.  All changed / newly prescribed medications were explained.  A complete medication list was provided to the patient / caregiver.  Prescriptions: ROBINUL-FORTE 2 MG TABS (GLYCOPYRROLATE) 1 tab by mouth two times a day as needed  #60 x 11   Entered by:   Christie Nottingham CMA (AAMA)   Authorized by:   Meryl Dare MD [facility]   Signed by:   Meryl Dare MD [facility] on 10/22/2009   Method used:   Electronically to        [facility] [phone]* (retail)       [address]       [place], [place]  [address]       Ph: [phone]       Fax: [phone]   RxID:   [id number]

## 2010-09-02 NOTE — Progress Notes (Signed)
Summary: med refill   Phone Note Call from Patient Call back at Work Phone [phone]   Caller: Patient Call For: Dr. Russella Dar Reason for Call: Refill Medication Summary of Call: pt went to pick up last med refill and was told by pharmacist that he would need an office visit first, but pt doesnt think this is correct information Initial call taken by: Vallarie Mare,  September 21, 2009 1:59 PM  Follow-up for Phone Call        Told pt that he does need a REV before we can send any more refills. He states he is going out of town and needs one more refill until his appt. pt given one more refill unitl appt on 10/22/09. Follow-up by: Christie Nottingham CMA Duncan Dull),  September 21, 2009 2:24 PM    Prescriptions: ROBINUL-FORTE 2 MG TABS (GLYCOPYRROLATE) 1 tab by mouth two times a day as needed  #60 x 0   Entered by:   Christie Nottingham CMA (AAMA)   Authorized by:   Meryl Dare MD [facility]   Signed by:   Christie Nottingham CMA (AAMA) on 09/21/2009   Method used:   Electronically to        [facility] [phone]* (retail)       [address]       [place], [place]  [address]       Ph: [phone]       Fax: [phone]   RxID:   [id number]

## 2010-09-02 NOTE — Assessment & Plan Note (Signed)
Summary: cpx/cjr//rsh bmp//lh   Vital Signs:  Patient profile:   55 year old male Height:      69 inches Weight:      156 pounds BMI:     23.12 Temp:     98.2 degrees F oral Pulse rate:   88 / minute Resp:     14 per minute BP sitting:   130 / 82  (left arm)  Vitals Entered By: Willy Eddy, LPN (Dec 16, 2009 10:39 AM) CC: cpx, Lipid Management   Primary Care Provider:  Darryll Capers MD  CC:  cpx and Lipid Management.  History of Present Illness: The pt was asked about all immunizations, health maint. services that are appropriate to their age and was given guidance on diet exercize  and weight management the main issues are anxiety with really minimal risks  Lipid Management History:      Positive NCEP/ATP III risk factors include male age 13 years old or older.  Negative NCEP/ATP III risk factors include non-tobacco-user status, non-hypertensive, no ASHD (atherosclerotic heart disease), no prior stroke/TIA, no peripheral vascular disease, and no history of aortic aneurysm.     Preventive Screening-Counseling & Management  Alcohol-Tobacco     Smoking Status: never  Problems Prior to Update: 1)  Screening Colorectal-cancer  (ICD-V76.51) 2)  Irritable Bowel Syndrome  (ICD-564.1) 3)  Hemorrhoids-external  (ICD-455.3) 4)  Hyperlipidemia  (ICD-272.4) 5)  Uns Advrs Eff Uns Rx Medicinal&biological Sbstnc  (ICD-995.20) 6)  Colonic Polyps, Hx of  (ICD-V12.72) 7)  Internal Hemorrhoids  (ICD-455.0) 8)  Gerd  (ICD-530.81) 9)  Anxiety  (ICD-300.00) 10)  Asthma  (ICD-493.90) 11)  Wart, Left Hand  (ICD-078.10) 12)  Family History of Cad Male 1st Degree Relative <50  (ICD-V17.3) 13)  Hyperlipidemia  (ICD-272.4) 14)  Rosacea  (ICD-695.3) 15)  Physical Examination  (ICD-V70.0)  Current Problems (verified): 1)  Screening Colorectal-cancer  (ICD-V76.51) 2)  Irritable Bowel Syndrome  (ICD-564.1) 3)  Hemorrhoids-external  (ICD-455.3) 4)  Hyperlipidemia  (ICD-272.4) 5)  Uns  Advrs Eff Uns Rx Medicinal&biological Sbstnc  (ICD-995.20) 6)  Colonic Polyps, Hx of  (ICD-V12.72) 7)  Internal Hemorrhoids  (ICD-455.0) 8)  Gerd  (ICD-530.81) 9)  Anxiety  (ICD-300.00) 10)  Asthma  (ICD-493.90) 11)  Wart, Left Hand  (ICD-078.10) 12)  Family History of Cad Male 1st Degree Relative <50  (ICD-V17.3) 13)  Hyperlipidemia  (ICD-272.4) 14)  Rosacea  (ICD-695.3) 15)  Physical Examination  (ICD-V70.0)  Medications Prior to Update: 1)  Adult Aspirin Low Strength 81 Mg  Tbdp (Aspirin) .... Once Daily 2)  Citrucel 500 Mg  Tabs (Methylcellulose (Laxative)) .... Two Times A Day 3)  Multivitamins   Caps (Multiple Vitamin) .... Once Daily 4)  Minocycline Hcl 100 Mg  Caps (Minocycline Hcl) .... Once Daily As Needed 5)  Elidel 1 %  Crea (Pimecrolimus) .... As Needed 6)  Fish Oil Concentrate 1000 Mg  Caps (Omega-3 Fatty Acids) .... 2 By Mouth Once Daily 7)  Canasa 1000 Mg  Supp (Mesalamine) .... One Suppository Into Rectum At Bedtime As Needed 8)  Analpram-Hc 1-2.5 % Crea (Hydrocortisone Ace-Pramoxine) .... Apply To Rectum Three Times A Day As Needed 9)  Robinul-Forte 2 Mg Tabs (Glycopyrrolate) .Marland Kitchen.. 1 Tab By Mouth Two Times A Day As Needed 10)  Alprazolam 0.25 Mg Tabs (Alprazolam) .... One Tab By Mouth Every 6 Hours As Needed 11)  Nabumetone 500 Mg Tabs (Nabumetone) .Marland Kitchen.. 1 Once Daily As Needed 12)  Crestor 20 Mg Tabs (Rosuvastatin Calcium) .Marland KitchenMarland KitchenMarland Kitchen  One By Mouth Every Friday  Current Medications (verified): 1)  Adult Aspirin Low Strength 81 Mg  Tbdp (Aspirin) .... Once Daily 2)  Citrucel 500 Mg  Tabs (Methylcellulose (Laxative)) .... Two Times A Day 3)  Multivitamins   Caps (Multiple Vitamin) .... Once Daily 4)  Minocycline Hcl 100 Mg  Caps (Minocycline Hcl) .... Once Daily As Needed 5)  Elidel 1 %  Crea (Pimecrolimus) .... As Needed 6)  Fish Oil Concentrate 1000 Mg  Caps (Omega-3 Fatty Acids) .... 2 By Mouth Once Daily 7)  Canasa 1000 Mg  Supp (Mesalamine) .... One Suppository Into Rectum  At Bedtime As Needed 8)  Analpram-Hc 1-2.5 % Crea (Hydrocortisone Ace-Pramoxine) .... Apply To Rectum Three Times A Day As Needed 9)  Robinul-Forte 2 Mg Tabs (Glycopyrrolate) .Marland Kitchen.. 1 Tab By Mouth Two Times A Day As Needed 10)  Alprazolam 0.25 Mg Tabs (Alprazolam) .... One Tab By Mouth Every 6 Hours As Needed 11)  Crestor 20 Mg Tabs (Rosuvastatin Calcium) .... One By Mouth Every Friday  Allergies (verified): 1)  ! Pcn  Past History:  Family History: Last updated: 10/22/2009 Family History of CAD Male 1st degree relative <50 Family History of Stomach Cancer:Maternal Uncles x 2  No FH of Colon Cancer:  Social History: Last updated: 10/22/2009 Married Never Smoked Alcohol use-no Drug use-no Regular exercise-yes Daily Caffeine Use  Risk Factors: Exercise: yes (01/28/2008)  Risk Factors: Smoking Status: never (12/16/2009)  Past medical, surgical, family and social histories (including risk factors) reviewed, and no changes noted (except as noted below).  Past Medical History: Reviewed history from 04/17/2008 and no changes required. HYPERPLASTC COLONIC POLYPS  INTERNAL and EXTERNAL HEMORRHOIDS GERD (ICD-530.81) ANXIETY (ICD-300.00) ASTHMA (ICD-493.90) WART, LEFT HAND (ICD-078.10) HYPERLIPIDEMIA (ICD-272.4) ROSACEA (ICD-695.3)  Past Surgical History: Reviewed history from 04/17/2008 and no changes required. Unremarkable  Family History: Reviewed history from 10/22/2009 and no changes required. Family History of CAD Male 1st degree relative <50 Family History of Stomach Cancer:Maternal Uncles x 2  No FH of Colon Cancer:  Social History: Reviewed history from 10/22/2009 and no changes required. Married Never Smoked Alcohol use-no Drug use-no Regular exercise-yes Daily Caffeine Use  Review of Systems  The patient denies anorexia, fever, weight loss, weight gain, vision loss, decreased hearing, hoarseness, chest pain, syncope, dyspnea on exertion, peripheral  edema, prolonged cough, headaches, hemoptysis, abdominal pain, melena, hematochezia, severe indigestion/heartburn, hematuria, incontinence, genital sores, muscle weakness, suspicious skin lesions, transient blindness, difficulty walking, depression, unusual weight change, abnormal bleeding, enlarged lymph nodes, angioedema, and breast masses.    Physical Exam  General:  Well-developed,well-nourished,in no acute distress; alert,appropriate and cooperative throughout examination Head:  Normocephalic and atraumatic. Ears:  R ear normal and L ear normal.   Neck:  No deformities, masses, or tenderness noted. Lungs:  Clear throughout to auscultation. Heart:  Regular rate and rhythm; no murmurs, rubs,  or bruits. Abdomen:  Soft, nontender and nondistended. No masses, hepatosplenomegaly or hernias noted. Normal bowel sounds. Msk:  No deformity or scoliosis noted of thoracic or lumbar spine.   Extremities:  No clubbing, cyanosis, edema, or deformity noted with normal full range of motion of all joints.   Neurologic:  Alert and  oriented x4;  grossly normal neurologically.   Impression & Recommendations:  Problem # 1:  PHYSICAL EXAMINATION (ICD-V70.0)  Colonoscopy: repeat in 2012 (07/27/2006) Td Booster: Tdap (10/26/2007)   Chol: 154 (12/09/2009)   HDL: 48.10 (12/09/2009)   LDL: 93 (12/09/2009)   TG: 65.0 (12/09/2009) TSH: 1.66 (12/09/2009)  PSA: 0.71 (12/09/2009)  Discussed using sunscreen, use of alcohol, drug use, self testicular exam, routine dental care, routine eye care, routine physical exam, seat belts, multiple vitamins, osteoporosis prevention, adequate calcium intake in diet, and recommendations for immunizations.  Discussed exercise and checking cholesterol.  Discussed gun safety, safe sex, and contraception. Also recommend checking PSA.  Problem # 2:  IRRITABLE BOWEL SYNDROME (ICD-564.1) constipation prone  Complete Medication List: 1)  Adult Aspirin Low Strength 81 Mg Tbdp  (Aspirin) .... Once daily 2)  Citrucel 500 Mg Tabs (Methylcellulose (laxative)) .... Two times a day 3)  Multivitamins Caps (Multiple vitamin) .... Once daily 4)  Minocycline Hcl 100 Mg Caps (Minocycline hcl) .... Once daily as needed 5)  Elidel 1 % Crea (Pimecrolimus) .... As needed 6)  Fish Oil Concentrate 1000 Mg Caps (Omega-3 fatty acids) .... 2 by mouth once daily 7)  Canasa 1000 Mg Supp (Mesalamine) .... One suppository into rectum at bedtime as needed 8)  Analpram-hc 1-2.5 % Crea (Hydrocortisone ace-pramoxine) .... Apply to rectum three times a day as needed 9)  Robinul-forte 2 Mg Tabs (Glycopyrrolate) .Marland Kitchen.. 1 tab by mouth two times a day as needed 10)  Alprazolam 0.25 Mg Tabs (Alprazolam) .... One tab by mouth every 6 hours as needed 11)  Crestor 20 Mg Tabs (Rosuvastatin calcium) .... One by mouth every friday 12)  Amitiza 8 Mcg Caps (Lubiprostone) .... One by mouth daily  Lipid Assessment/Plan:      Based on NCEP/ATP III, the patient's risk factor category is "0-1 risk factors".  The patient's lipid goals are as follows: Total cholesterol goal is 200; LDL cholesterol goal is 160; HDL cholesterol goal is 40; Triglyceride goal is 150.  His LDL cholesterol goal has not been met.  Secondary causes for hyperlipidemia have been ruled out.  He has been counseled on adjunctive measures for lowering his cholesterol and has been provided with dietary instructions.

## 2010-09-02 NOTE — Progress Notes (Signed)
Summary: Pt req partial refill of alprazolam to [facility] Note Call from Patient Call back at [phone] cell   Caller: Patient Summary of Call: Pt req partial refill of alprazolam to last until his ov in 2 weeks. Pls call in to [facility].  Initial call taken by: Lucy Antigua,  November 26, 2009 11:50 AM    Prescriptions: ALPRAZOLAM 0.25 MG TABS (ALPRAZOLAM) one tab by mouth every 6 hours as needed  #30 x 0   Entered by:   Willy Eddy, LPN   Authorized by:   Stacie Glaze MD   Signed by:   Willy Eddy, LPN on 14/78/2956   Method used:   Telephoned to ...       [facility] [address] [phone]* (retail)       [address]       [place], [place]  [address]       Ph: [phone]       Fax: [phone]   RxID:   [id number]

## 2010-09-22 ENCOUNTER — Ambulatory Visit (INDEPENDENT_AMBULATORY_CARE_PROVIDER_SITE_OTHER): Payer: BC Managed Care – PPO | Admitting: Psychology

## 2010-09-22 DIAGNOSIS — F411 Generalized anxiety disorder: Secondary | ICD-10-CM

## 2010-09-28 ENCOUNTER — Telehealth: Payer: Self-pay | Admitting: Gastroenterology

## 2010-09-30 ENCOUNTER — Encounter: Payer: Self-pay | Admitting: Gastroenterology

## 2010-09-30 ENCOUNTER — Ambulatory Visit (INDEPENDENT_AMBULATORY_CARE_PROVIDER_SITE_OTHER): Payer: BC Managed Care – PPO | Admitting: Gastroenterology

## 2010-09-30 DIAGNOSIS — R197 Diarrhea, unspecified: Secondary | ICD-10-CM | POA: Insufficient documentation

## 2010-09-30 DIAGNOSIS — K589 Irritable bowel syndrome without diarrhea: Secondary | ICD-10-CM

## 2010-09-30 DIAGNOSIS — K219 Gastro-esophageal reflux disease without esophagitis: Secondary | ICD-10-CM

## 2010-10-07 NOTE — Assessment & Plan Note (Signed)
Summary: Follow up IBS flare   History of Present Illness Visit Type: Follow-up Visit Primary GI MD: Elie Goody MD [facility] Primary Provider: Darryll Capers MD Chief Complaint: Pt having stomach issues, wants to discuss medications  History of Present Illness:   55 y.o. with intermittent abd crampy pain and diarrhea occuring at 2-3am. He had one episode of N/V during a servere episode of diarrhea and abd pain at night. He takes Amitiza at bedtime. He has occasional constipation. He has new belching and heartburn at night.     GI Review of Systems    Reports abdominal pain, acid reflux, belching, nausea, and  vomiting.     Location of  Abdominal pain: lower abdomen.    Denies bloating, chest pain, dysphagia with liquids, dysphagia with solids, heartburn, loss of appetite, vomiting blood, weight loss, and  weight gain.      Reports change in bowel habits, constipation, and  diarrhea.     Denies anal fissure, black tarry stools, diverticulosis, fecal incontinence, heme positive stool, hemorrhoids, irritable bowel syndrome, jaundice, light color stool, liver problems, rectal bleeding, and  rectal pain.   Current Medications (verified): 1)  Adult Aspirin Low Strength 81 Mg  Tbdp (Aspirin) .... Once Daily 2)  Citrucel 500 Mg  Tabs (Methylcellulose (Laxative)) .... Two Times A Day 3)  Multivitamins   Caps (Multiple Vitamin) .... Once Daily 4)  Minocycline Hcl 100 Mg  Caps (Minocycline Hcl) .... Once Daily As Needed 5)  Elidel 1 %  Crea (Pimecrolimus) .... As Needed 6)  Fish Oil Concentrate 1000 Mg  Caps (Omega-3 Fatty Acids) .... 2 By Mouth Once Daily 7)  Canasa 1000 Mg  Supp (Mesalamine) .... One Suppository Into Rectum At Bedtime As Needed 8)  Analpram-Hc 1-2.5 % Crea (Hydrocortisone Ace-Pramoxine) .... Apply To Rectum Three Times A Day As Needed 9)  Robinul-Forte 2 Mg Tabs (Glycopyrrolate) .Marland Kitchen.. 1 Tab By Mouth Two Times A Day As Needed 10)  Alprazolam 0.25 Mg Tabs (Alprazolam) .... One Tab  By Mouth Every 6 Hours As Needed 11)  Crestor 20 Mg Tabs (Rosuvastatin Calcium) .... One By Mouth Every Friday 12)  Amitiza 8 Mcg Caps (Lubiprostone) .... One By Mouth Daily 13)  Lodrane 12d 6-45 Mg Xr12h-Tab (Brompheniramine-Pseudoeph) .Marland Kitchen.. 1 Two Times A Day  Allergies (verified): 1)  ! Pcn  Past History:  Past Medical History: Last updated: 04/17/2008 HYPERPLASTC COLONIC POLYPS  INTERNAL and EXTERNAL HEMORRHOIDS GERD (ICD-530.81) ANXIETY (ICD-300.00) ASTHMA (ICD-493.90) WART, LEFT HAND (ICD-078.10) HYPERLIPIDEMIA (ICD-272.4) ROSACEA (ICD-695.3)  Past Surgical History: Last updated: 04/17/2008 Unremarkable  Family History: Last updated: 10/22/2009 Family History of CAD Male 1st degree relative <50 Family History of Stomach Cancer:Maternal Uncles x 2  No FH of Colon Cancer:  Social History: Last updated: 10/22/2009 Married Never Smoked Alcohol use-no Drug use-no Regular exercise-yes Daily Caffeine Use  Vital Signs:  Patient profile:   55 year old male Height:      69 inches Weight:      158 pounds BMI:     23.42 BSA:     1.87 Pulse rate:   120 / minute Pulse rhythm:   regular BP sitting:   150 / 80  (left arm)  Vitals Entered By: Merri Ray CMA (AAMA) (September 30, 2010 10:12 AM)  Physical Exam  General:  Well developed, well nourished, no acute distress. Head:  Normocephalic and atraumatic. Eyes:  PERRLA, no icterus. Ears:  Normal auditory acuity. Mouth:  No deformity or lesions, dentition normal. Neck:  Supple; no masses or thyromegaly. Lungs:  Clear throughout to auscultation. Heart:  Regular rate and rhythm; no murmurs, rubs,  or bruits. Abdomen:  Soft, nontender and nondistended. No masses, hepatosplenomegaly or hernias noted. Normal bowel sounds. Msk:  Symmetrical with no gross deformities. Normal posture. Pulses:  Normal pulses noted. Extremities:  No clubbing, cyanosis, edema or deformities noted. Neurologic:  Alert and  oriented x4;   grossly normal neurologically. Cervical Nodes:  No significant cervical adenopathy. Inguinal Nodes:  No significant inguinal adenopathy. Psych:  Alert and cooperative. anxious.    Impression & Recommendations:  Problem # 1:  DIARRHEA (ICD-787.91) Nocturnal diarrhea and abd pain is likely a side effect of Amitiza. DC Amitiza. If these symptoms do not abate he need an REV.  Problem # 2:  IRRITABLE BOWEL SYNDROME (ICD-564.1) Symptoms under good control with glycopyrrolate, fiber and stool softeners. If constipation becomes a problem off Amitiza he can start Miralax two times a day as needed.  Problem # 3:  GERD (ICD-530.81) Begin omeprazole 20mg  by mouth once daily and antireflux measures for 2 weeks then may use omeprazole as needed.  Problem # 4:  ANXIETY (ICD-300.00)  Patient Instructions: 1)  Discontinue Amitiza. 2)  Start Miralax 17 grams in 8 oz of water once daily as needed for constipation.  3)  Avoid foods high in acid content ( tomatoes, citrus juices, spicy foods) . Avoid eating within 3 to 4 hours of lying down or before exercising. Do not over eat; try smaller more frequent meals. Elevate head of bed four inches when sleeping.  4)  Start Prilosec OTC one tablet by mouth once daily x 2 weeks then as needed. 5)  Copy sent to : Darryll Capers, MD 6)  The medication list was reviewed and reconciled.  All changed / newly prescribed medications were explained.  A complete medication list was provided to the patient / caregiver.

## 2010-10-07 NOTE — Progress Notes (Signed)
Summary: medication not working   Phone Note Call from Patient Call back at Work Phone [phone]   Caller: Patient Call For: Dr Russella Dar Reason for Call: Talk to Nurse Summary of Call: Patient wants to speak to nurse regarding meds he's taking that's not working. Initial call taken by: Tawni Levy,  September 28, 2010 3:07 PM  Follow-up for Phone Call        Patient has glycopyrolate and this is not really helping his IBS.  Dr Lovell Sheehan started him on Amitiza and now he is getting diarrhea.  I hae advised him he needs an office appointment to sort out.  I have scheduled him an office visit for 09/30/10 Follow-up by: Darcey Nora RN, CGRN,  September 28, 2010 3:52 PM

## 2010-10-08 ENCOUNTER — Ambulatory Visit: Payer: BC Managed Care – PPO | Admitting: Gastroenterology

## 2010-10-19 ENCOUNTER — Other Ambulatory Visit: Payer: Self-pay | Admitting: Internal Medicine

## 2010-10-21 ENCOUNTER — Telehealth: Payer: Self-pay | Admitting: *Deleted

## 2010-10-21 MED ORDER — AZITHROMYCIN 500 MG PO TABS: 500.0000 mg | ORAL_TABLET | Freq: Every day | ORAL | Status: AC

## 2010-10-21 NOTE — Telephone Encounter (Signed)
Tyler [name] in [place].......Marland KitchenZpack 500 mg. One po daily #3 Called to pharmacy, and pt notified.

## 2010-10-21 NOTE — Telephone Encounter (Signed)
[facility] Triage Call Report Triage Record Num: [id number] Operator: Audelia Hives Patient Name: Tyler [name] Call Date & Time: 10/20/2010 8:03:01PM Patient Phone: [phone] PCP: Darryll Capers Patient Gender: Male PCP Fax : [phone] Patient DOB: 1956-03-09 [facility] Name: [facility] Reason for Call: [name] calling regarding possible sinus infection, stuffy nose, head congestion, onset 10/17/10. Afebrile, Taking Mucinex and Saline NS. Pt wants MD called for abx since he is flying in the am 10/21/10. Emergent s/s for Upper Respiratory Infection r/o per protocol and home care advice given. Spoke with Dr. Drue Novel and advised to call office in am 10/21/10 for eval. Protocol(s) Used: Upper Respiratory Infection (URI) Recommended Outcome per Protocol: Provide Home/Self Care Reason for Outcome: Ear congestion, pressure, fullness Care Advice: ~ Call provider if symptoms do not resolve in 3 days or if new symptoms develop. ~ SYMPTOM / CONDITION MANAGEMENT Most adults need to drink 6-10 eight-ounce glasses (1.2-2.0 liters) of fluids per day unless previously told to limit fluid intake for other medical reasons. Limit fluids that contain caffeine, sugar or alcohol. Urine will be a very light yellow color when you drink enough fluids. ~ 10/20/2010 8:26:30PM Page 1 of 1 [id number]

## 2010-11-03 ENCOUNTER — Other Ambulatory Visit: Payer: Self-pay | Admitting: Gastroenterology

## 2010-11-26 ENCOUNTER — Ambulatory Visit (INDEPENDENT_AMBULATORY_CARE_PROVIDER_SITE_OTHER): Payer: BC Managed Care – PPO | Admitting: Psychology

## 2010-11-26 DIAGNOSIS — F411 Generalized anxiety disorder: Secondary | ICD-10-CM

## 2010-12-13 ENCOUNTER — Other Ambulatory Visit (INDEPENDENT_AMBULATORY_CARE_PROVIDER_SITE_OTHER): Payer: BC Managed Care – PPO | Admitting: Internal Medicine

## 2010-12-13 DIAGNOSIS — Z0000 Encounter for general adult medical examination without abnormal findings: Secondary | ICD-10-CM

## 2010-12-13 LAB — HEPATIC FUNCTION PANEL
ALT: 13 U/L (ref 0–53)
AST: 20 U/L (ref 0–37)
Albumin: 4 g/dL (ref 3.5–5.2)
Alkaline Phosphatase: 44 U/L (ref 39–117)
Bilirubin, Direct: 0.1 mg/dL (ref 0.0–0.3)
Total Bilirubin: 0.6 mg/dL (ref 0.3–1.2)
Total Protein: 6.8 g/dL (ref 6.0–8.3)

## 2010-12-13 LAB — CBC WITH DIFFERENTIAL/PLATELET
Basophils Absolute: 0 10*3/uL (ref 0.0–0.1)
Basophils Relative: 0.7 % (ref 0.0–3.0)
Eosinophils Absolute: 0.1 10*3/uL (ref 0.0–0.7)
Eosinophils Relative: 1.8 % (ref 0.0–5.0)
HCT: 49.1 % (ref 39.0–52.0)
Hemoglobin: 16.9 g/dL (ref 13.0–17.0)
Lymphocytes Relative: 33.9 % (ref 12.0–46.0)
Lymphs Abs: 2.4 10*3/uL (ref 0.7–4.0)
MCHC: 34.4 g/dL (ref 30.0–36.0)
MCV: 90.5 fl (ref 78.0–100.0)
Monocytes Absolute: 0.6 10*3/uL (ref 0.1–1.0)
Monocytes Relative: 8.9 % (ref 3.0–12.0)
Neutro Abs: 3.9 10*3/uL (ref 1.4–7.7)
Neutrophils Relative %: 54.7 % (ref 43.0–77.0)
Platelets: 271 10*3/uL (ref 150.0–400.0)
RBC: 5.42 Mil/uL (ref 4.22–5.81)
RDW: 13.5 % (ref 11.5–14.6)
WBC: 7.1 10*3/uL (ref 4.5–10.5)

## 2010-12-13 LAB — LIPID PANEL
Cholesterol: 148 mg/dL (ref 0–200)
HDL: 47.1 mg/dL (ref >39.00)
LDL Cholesterol: 83 mg/dL (ref 0–99)
Total CHOL/HDL Ratio: 3
Triglycerides: 88 mg/dL (ref 0.0–149.0)
VLDL: 17.6 mg/dL (ref 0.0–40.0)

## 2010-12-13 LAB — POCT URINALYSIS DIPSTICK
Bilirubin, UA: NEGATIVE
Glucose, UA: NEGATIVE
Ketones, UA: NEGATIVE
Leukocytes, UA: NEGATIVE
Nitrite, UA: NEGATIVE
Spec Grav, UA: 1.015
Urobilinogen, UA: 0.2
pH, UA: 5.5

## 2010-12-13 LAB — BASIC METABOLIC PANEL
BUN: 15 mg/dL (ref 6–23)
CO2: 23 mEq/L (ref 19–32)
Calcium: 9.6 mg/dL (ref 8.4–10.5)
Chloride: 105 mEq/L (ref 96–112)
Creatinine, Ser: 1 mg/dL (ref 0.4–1.5)
GFR: 83.13 mL/min (ref >60.00)
Glucose, Bld: 87 mg/dL (ref 70–99)
Potassium: 4.3 mEq/L (ref 3.5–5.1)
Sodium: 138 mEq/L (ref 135–145)

## 2010-12-13 LAB — PSA: PSA: 0.88 ng/mL (ref 0.10–4.00)

## 2010-12-13 LAB — TSH: TSH: 2.28 u[IU]/mL (ref 0.35–5.50)

## 2010-12-14 NOTE — Assessment & Plan Note (Signed)
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  His symptoms completely resolved  after a course of Canasa suppositories and AnaMantle cream.  He remains  on Citrucel and stool softeners with no gastrointestinal complaints.   CURRENT MEDICATIONS:  Current medications listed on the chart - updated  and reviewed.   MEDICATION ALLERGIES:  PENICILLIN.   PHYSICAL EXAMINATION:  GENERAL APPEARANCE:  No acute distress.  VITAL SIGNS:  Weight 164 pounds, blood pressure 110/80, pulse 72 and  regular.  He is not re-examined.   ASSESSMENT/PLAN:  Internal and external hemorrhoids.  Continue a high  fiber diet with daily Citrucel supplements and adequate fluid intake  long term.  May discontinue Colace unless his stools are too hard.  Retreat hemorrhoids as needed.  Ongoing followup with Dr. Darryll [name].  I will see him as needed.     Venita Lick. Russella Dar, MD, [facility]  Electronically Signed    MTS/MedQ  DD: 01/01/2007  DT: 01/01/2007  Job #: [id number]   cc:   Tyler Glaze, MD

## 2010-12-15 ENCOUNTER — Encounter: Payer: Self-pay | Admitting: Internal Medicine

## 2010-12-17 NOTE — Assessment & Plan Note (Signed)
[facility]                                 ON-CALL NOTE   NAME:SILVERMANJaiveon, [name]                      MRN:          [id number]  DATE:07/28/2006                            DOB:          Mar 16, 1956    REASON:  [name] called stating he is having hiccups.  He  underwent a colonoscopy today.  He has no pain.  He does complain of  some pyrosis.   I instructed [name] to take some antacids.  If the hiccups do not  subside, he is to take over-the-counter Prilosec.     Barbette Hair. Arlyce Dice, MD,FACG  Electronically Signed    RDK/MedQ  DD: 07/28/2006  DT: 07/28/2006  Job #: [id number]   cc:   Venita Lick. Russella Dar, MD, [facility]

## 2010-12-17 NOTE — Assessment & Plan Note (Signed)
[name]                         GASTROENTEROLOGY OFFICE NOTE   NAME:Tyler [name], Tyler [name]                    MRN:          [id number]  DATE:11/16/2006                            DOB:          1956/05/25    REASON FOR REFERAL:  Hemorrhoids.   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  Small internal hemorrhoids were noted along with an area of polypoid  mucosa that proved not to be neoplastic on biopsy. He has noted about 3  weeks of mild constipation with rectal discomfort and swelling. He has  tried stool softeners, Citrucel and over-the-counter hemorrhoidal creams  without improvement in his rectal symptoms. He was seen by Dr. Lovell Sheehan  who found external hemorrhoids and he is now referred for further  evaluation. His mild constipation resolved easily with stool softeners  and Citrucel.   FAMILY HISTORY:  Negative for colon cancer, colon polyps, and  inflammatory bowel disease.   PAST MEDICAL HISTORY:  Internal and external hemorrhoids, anxiety.  History of allergies and asthma.   PAST SURGICAL HISTORY:  Negative.   CURRENT MEDICATIONS:  Listed on the chart, updated and reviewed.   MEDICATION ALLERGIES:  PENICILLIN.   Social history and review of systems per the handwritten form.   PHYSICAL EXAMINATION:  Well-developed, well-nourished white male in no  acute distress. Height 5 feet 7 inches, weight 161.8 pounds, blood  pressure 138/90, pulse 100 and regular.  HEENT EXAM: Anicteric sclera. Oropharynx clear.  CHEST: Clear to auscultation bilaterally.  CARDIAC: Regular rate and rhythm without murmurs appreciated.  ABDOMEN: Soft, nontender, nondistended. Normoactive bowel sounds. No  palpable organomegaly, masses, or hernias.  RECTAL EXAMINATION: Reveals 3 external hemorrhoids with 1 that is large,  they do not appear to be thrombosed and  are only mildy tender. Digital  examination reveals no internal lesions and trace Hemoccult positive  brown stool in the vault.  NEUROLOGIC: Alert and oriented x3. Grossly nonfocal.   ASSESSMENT/PLAN:  Internal and external hemorrhoids with trace Hemoccult  positive stool secondary to hemorrhoids. Begin Canasa 1000 mg  suppositories at bedtime for weeks and repeat for 2 weeks if his  symptoms are not fully resolved. Begin AnaMantle cream externally t.i.d.  for 2 weeks and continue for 2 more weeks if his symptoms have not  completely resolved. He should remain on a high fiber diet along with  Citrucel and increased fluid intake for  management of mild constipation. He is given all standard instructions  on hemorrhoids and rectal care. Return office visit in 3 to 4 weeks.     Venita Lick. Russella Dar, MD, [facility]  Electronically Signed    MTS/MedQ  DD: 11/23/2006  DT: 11/23/2006  Job #: [id number]   cc:   Stacie Glaze, MD

## 2010-12-20 ENCOUNTER — Encounter: Payer: Self-pay | Admitting: Internal Medicine

## 2010-12-20 ENCOUNTER — Ambulatory Visit (INDEPENDENT_AMBULATORY_CARE_PROVIDER_SITE_OTHER): Payer: BC Managed Care – PPO | Admitting: Internal Medicine

## 2010-12-20 DIAGNOSIS — N138 Other obstructive and reflux uropathy: Secondary | ICD-10-CM

## 2010-12-20 DIAGNOSIS — R338 Other retention of urine: Secondary | ICD-10-CM

## 2010-12-20 DIAGNOSIS — K648 Other hemorrhoids: Secondary | ICD-10-CM

## 2010-12-20 DIAGNOSIS — Z0000 Encounter for general adult medical examination without abnormal findings: Secondary | ICD-10-CM

## 2010-12-20 DIAGNOSIS — E785 Hyperlipidemia, unspecified: Secondary | ICD-10-CM

## 2010-12-20 DIAGNOSIS — R339 Retention of urine, unspecified: Secondary | ICD-10-CM

## 2010-12-20 DIAGNOSIS — N401 Enlarged prostate with lower urinary tract symptoms: Secondary | ICD-10-CM

## 2010-12-20 NOTE — Progress Notes (Signed)
  Subjective:    Patient ID: Tyler [name], male    DOB: 23-Jan-1956, 55 y.o.   MRN: [id number]  HPI For CPX Has noted some effect on the flow and has NOCTURIA X 1 Whicht IS NEW    Review of Systems  Constitutional: Negative for fever and fatigue.  HENT: Negative for hearing loss, congestion, neck pain and postnasal drip.   Eyes: Negative for discharge, redness and visual disturbance.  Respiratory: Negative for cough, shortness of breath and wheezing.   Cardiovascular: Negative for leg swelling.  Gastrointestinal: Negative for abdominal pain, constipation and abdominal distention.  Genitourinary: Negative for urgency and frequency.  Musculoskeletal: Negative for joint swelling and arthralgias.  Skin: Negative for color change and rash.  Neurological: Negative for weakness and light-headedness.  Hematological: Negative for adenopathy.  Psychiatric/Behavioral: Negative for behavioral problems.   Past Medical History  Diagnosis Date  . Colon polyps   . Hemorrhoids   . GERD (gastroesophageal reflux disease)   . Anxiety   . Asthma   . Viral warts, unspecified   . Hyperlipidemia   . Rosacea    No past surgical history on file.  reports that he has never smoked. He does not have any smokeless tobacco history on file. He reports that he does not drink alcohol or use illicit drugs. family history includes Coronary artery disease in an unspecified family member and Stomach cancer in his maternal uncle. Allergies  Allergen Reactions  . Penicillins        Objective:   Physical Exam  Constitutional: He is oriented to person, place, and time. He appears well-developed and well-nourished.  HENT:  Head: Normocephalic and atraumatic.  Eyes: Conjunctivae are normal. Pupils are equal, round, and reactive to light.  Neck: Normal range of motion. Neck supple.  Cardiovascular: Normal rate and regular rhythm.   Pulmonary/Chest: Effort normal and breath sounds normal.  Abdominal: Soft.  Bowel sounds are normal.  Musculoskeletal: Normal range of motion.  Neurological: He is alert and oriented to person, place, and time.  Skin: Skin is warm and dry.  Psychiatric: He has a normal mood and affect. His behavior is normal.          Assessment & Plan:   Patient presents for yearly preventative medicine examination.   all immunizations and health maintenance protocols were reviewed with the patient and they are up to date with these protocols.   screening laboratory values were reviewed with the patient including screening of hyperlipidemia PSA renal function and hepatic function.   There medications past medical history social history problem list and allergies were reviewed in detail.   Goals were established with regard to weight loss exercise diet in compliance with medications New diagnosis benign prostatic hypertrophy his prostate is approximately 45 g or 30 cc HEENT he is moderately symptomatic.  We discussed the use of supplements such as saw palmetto zinc

## 2010-12-24 ENCOUNTER — Other Ambulatory Visit: Payer: Self-pay | Admitting: Gastroenterology

## 2011-05-24 ENCOUNTER — Other Ambulatory Visit: Payer: Self-pay | Admitting: Gastroenterology

## 2011-05-24 MED ORDER — MESALAMINE 1000 MG RE SUPP: 1000.0000 mg | Freq: Two times a day (BID) | RECTAL | Status: DC

## 2011-05-24 NOTE — Telephone Encounter (Signed)
Sent one refill to pharmacy. 

## 2011-05-25 ENCOUNTER — Telehealth: Payer: Self-pay | Admitting: Gastroenterology

## 2011-05-25 NOTE — Telephone Encounter (Signed)
Has questions about Canasa suppositories.  Instructions on the refill said BID.  He is advised to insert rectally just one time a day

## 2011-06-15 ENCOUNTER — Other Ambulatory Visit (INDEPENDENT_AMBULATORY_CARE_PROVIDER_SITE_OTHER): Payer: 59

## 2011-06-15 DIAGNOSIS — E785 Hyperlipidemia, unspecified: Secondary | ICD-10-CM

## 2011-06-15 LAB — LIPID PANEL
Cholesterol: 152 mg/dL (ref 0–200)
HDL: 45.5 mg/dL (ref >39.00)
LDL Cholesterol: 83 mg/dL (ref 0–99)
Total CHOL/HDL Ratio: 3
Triglycerides: 119 mg/dL (ref 0.0–149.0)
VLDL: 23.8 mg/dL (ref 0.0–40.0)

## 2011-06-20 NOTE — Progress Notes (Signed)
  Subjective:    Patient ID: Tyler [name], male    DOB: 08/03/1955, 55 y.o.   MRN: [id number]  HPI    Review of Systems     Objective:   Physical Exam        Assessment & Plan:

## 2011-06-22 ENCOUNTER — Encounter: Payer: Self-pay | Admitting: Internal Medicine

## 2011-06-22 ENCOUNTER — Ambulatory Visit (INDEPENDENT_AMBULATORY_CARE_PROVIDER_SITE_OTHER): Payer: 59 | Admitting: Internal Medicine

## 2011-06-22 VITALS — BP 150/90 | HR 80 | Temp 98.2°F | Resp 16 | Ht 69.0 in | Wt 155.0 lb

## 2011-06-22 DIAGNOSIS — Z0000 Encounter for general adult medical examination without abnormal findings: Secondary | ICD-10-CM

## 2011-06-22 DIAGNOSIS — K589 Irritable bowel syndrome without diarrhea: Secondary | ICD-10-CM

## 2011-06-22 DIAGNOSIS — N138 Other obstructive and reflux uropathy: Secondary | ICD-10-CM

## 2011-06-22 DIAGNOSIS — J45909 Unspecified asthma, uncomplicated: Secondary | ICD-10-CM

## 2011-06-22 DIAGNOSIS — N401 Enlarged prostate with lower urinary tract symptoms: Secondary | ICD-10-CM

## 2011-06-22 DIAGNOSIS — E785 Hyperlipidemia, unspecified: Secondary | ICD-10-CM

## 2011-06-22 NOTE — Patient Instructions (Signed)
The patient is instructed to continue all medications as prescribed. Schedule followup with check out clerk upon leaving the clinic  

## 2011-06-22 NOTE — Progress Notes (Signed)
Subjective:    Patient ID: Tyler [name], male    DOB: 1956/06/19, 55 y.o.   MRN: [id number]  HPI The pt has been stable on medications The pt has noted increased IBS symptoms He has a noted increased gas and has a family for celiac dz. Discussion a moderate elevation of PSA and monitoring total and free PSA Review lipid results    Review of Systems  Constitutional: Negative for fever and fatigue.  HENT: Negative for hearing loss, congestion, neck pain and postnasal drip.   Eyes: Negative for discharge, redness and visual disturbance.  Respiratory: Negative for cough, shortness of breath and wheezing.   Cardiovascular: Negative for leg swelling.  Gastrointestinal: Negative for abdominal pain, constipation and abdominal distention.  Genitourinary: Negative for urgency and frequency.  Musculoskeletal: Negative for joint swelling and arthralgias.  Skin: Negative for color change and rash.  Neurological: Negative for weakness and light-headedness.  Hematological: Negative for adenopathy.  Psychiatric/Behavioral: Negative for behavioral problems.   Past Medical History  Diagnosis Date  . Colon polyps   . Hemorrhoids   . GERD (gastroesophageal reflux disease)   . Anxiety   . Asthma   . Viral warts, unspecified   . Hyperlipidemia   . Rosacea     History   Social History  . Marital Status: Married    Spouse Name: N/A    Number of Children: N/A  . Years of Education: N/A   Occupational History  . Not on file.   Social History Main Topics  . Smoking status: Never Smoker   . Smokeless tobacco: Not on file  . Alcohol Use: No  . Drug Use: No  . Sexually Active: Not on file   Other Topics Concern  . Not on file   Social History Narrative   Regular exercise-uses caffeine    No past surgical history on file.  Family History  Problem Relation Age of Onset  . Coronary artery disease    . Stomach cancer Maternal Uncle     Allergies  Allergen Reactions  .  Penicillins     Current Outpatient Prescriptions on File Prior to Visit  Medication Sig Dispense Refill  . ALPRAZolam (XANAX) 0.25 MG tablet TAKE 1 TABLET BY MOUTH EVERY 6 HOURS AS NEEDED  30 tablet  2  . aspirin 81 MG EC tablet Take 81 mg by mouth daily.        . fexofenadine-pseudoephedrine (ALLEGRA-D 24) 180-240 MG per 24 hr tablet Take 1 tablet by mouth daily as needed.        Marland Kitchen glycopyrrolate (ROBINUL) 2 MG tablet TAKE 1 TABLET BY MOUTH TWICE DAILY AS NEEDED  60 tablet  11  . hydrocortisone-pramoxine (ANALPRAM-HC) 2.5-1 % rectal cream Place rectally 3 (three) times daily.        . mesalamine (CANASA) 1000 MG suppository Place 1 suppository (1,000 mg total) rectally 2 (two) times daily.  30 suppository  0  . Methylcellulose, Laxative, (CITRUCEL) 500 MG TABS Take 2 tablets by mouth 2 (two) times daily.       . minocycline (MINOCIN,DYNACIN) 100 MG capsule Take 100 mg by mouth daily as needed.        . multivitamin (THERAGRAN) per tablet Take 1 tablet by mouth daily.        . Omega-3 Fatty Acids (FISH OIL) 1000 MG CAPS Take 2 capsules by mouth daily.        Marland Kitchen omeprazole (PRILOSEC) 20 MG capsule Take 20 mg by mouth daily as needed.       Marland Kitchen  pimecrolimus (ELIDEL) 1 % cream Apply topically 2 (two) times daily as needed.        . rosuvastatin (CRESTOR) 20 MG tablet Take 20 mg by mouth once a week.          BP 150/90  Pulse 80  Temp 98.2 F (36.8 C)  Resp 16  Ht 5\' 9"  (1.753 m)  Wt 155 lb (70.308 kg)  BMI 22.89 kg/m2       Objective:   Physical Exam  Nursing note and vitals reviewed. Constitutional: He appears well-developed and well-nourished.  HENT:  Head: Normocephalic and atraumatic.  Eyes: Conjunctivae are normal. Pupils are equal, round, and reactive to light.  Neck: Normal range of motion. Neck supple.  Cardiovascular: Normal rate and regular rhythm.   Pulmonary/Chest: Effort normal and breath sounds normal.  Abdominal: Soft. Bowel sounds are normal.            Assessment & Plan:  Add probiotic or yogurt to help irritable bowel syndrome Stable lipid panel at high risk for cardiovascular disease based family history and male greater than 50 Moderate elevation in PSA to be monitored with serial PSA and free PSA Stable asthma

## 2011-10-04 ENCOUNTER — Telehealth: Payer: Self-pay | Admitting: *Deleted

## 2011-10-04 NOTE — Telephone Encounter (Signed)
Ov for am

## 2011-10-04 NOTE — Telephone Encounter (Signed)
Pt is complaining of nasal congestion, minimal cough, and no fever x 6 days.  Is going out of town next week, and needs RX sent to [facility].  [phone].

## 2011-10-05 ENCOUNTER — Ambulatory Visit (INDEPENDENT_AMBULATORY_CARE_PROVIDER_SITE_OTHER): Payer: 59 | Admitting: Internal Medicine

## 2011-10-05 ENCOUNTER — Encounter: Payer: Self-pay | Admitting: Internal Medicine

## 2011-10-05 ENCOUNTER — Other Ambulatory Visit: Payer: Self-pay | Admitting: *Deleted

## 2011-10-05 VITALS — BP 136/80 | HR 80 | Temp 98.1°F | Resp 16 | Ht 69.0 in | Wt 155.0 lb

## 2011-10-05 DIAGNOSIS — J019 Acute sinusitis, unspecified: Secondary | ICD-10-CM

## 2011-10-05 MED ORDER — LEVOFLOXACIN 500 MG PO TABS: 500.0000 mg | ORAL_TABLET | Freq: Every day | ORAL | Status: AC

## 2011-10-05 MED ORDER — ALPRAZOLAM 0.25 MG PO TABS: 0.2500 mg | ORAL_TABLET | Freq: Three times a day (TID) | ORAL | Status: DC | PRN

## 2011-10-05 NOTE — Patient Instructions (Signed)
The patient is instructed to continue all medications as prescribed. Schedule followup with check out clerk upon leaving the clinic  

## 2011-10-05 NOTE — Progress Notes (Signed)
Subjective:    Patient ID: Tyler [name], male    DOB: Jul 07, 1956, 56 y.o.   MRN: [id number]  HPI Patient has had a 5 to six-day history of upper respiratory tract symptoms. Presented with increased fatigue, sinus congestion, discharge cough Ear pressure and upset stomach. Has been using Tylenol and NyQuil.   Review of Systems  Constitutional: Negative for fever and fatigue.  HENT: Negative for hearing loss, congestion, neck pain and postnasal drip.   Eyes: Negative for discharge, redness and visual disturbance.  Respiratory: Negative for cough, shortness of breath and wheezing.   Cardiovascular: Negative for leg swelling.  Gastrointestinal: Negative for abdominal pain, constipation and abdominal distention.  Genitourinary: Negative for urgency and frequency.  Musculoskeletal: Negative for joint swelling and arthralgias.  Skin: Negative for color change and rash.  Neurological: Negative for weakness and light-headedness.  Hematological: Negative for adenopathy.  Psychiatric/Behavioral: Negative for behavioral problems.   Past Medical History  Diagnosis Date  . Colon polyps   . Hemorrhoids   . GERD (gastroesophageal reflux disease)   . Anxiety   . Asthma   . Viral warts, unspecified   . Hyperlipidemia   . Rosacea     History   Social History  . Marital Status: Married    Spouse Name: N/A    Number of Children: N/A  . Years of Education: N/A   Occupational History  . Not on file.   Social History Main Topics  . Smoking status: Never Smoker   . Smokeless tobacco: Not on file  . Alcohol Use: No  . Drug Use: No  . Sexually Active: Not on file   Other Topics Concern  . Not on file   Social History Narrative   Regular exercise-uses caffeine    No past surgical history on file.  Family History  Problem Relation Age of Onset  . Coronary artery disease    . Stomach cancer Maternal Uncle     Allergies  Allergen Reactions  . Penicillins     Current  Outpatient Prescriptions on File Prior to Visit  Medication Sig Dispense Refill  . aspirin 81 MG EC tablet Take 81 mg by mouth daily.        . fexofenadine-pseudoephedrine (ALLEGRA-D 24) 180-240 MG per 24 hr tablet Take 1 tablet by mouth daily as needed.        Marland Kitchen glycopyrrolate (ROBINUL) 2 MG tablet TAKE 1 TABLET BY MOUTH TWICE DAILY AS NEEDED  60 tablet  11  . hydrocortisone-pramoxine (ANALPRAM-HC) 2.5-1 % rectal cream Place rectally 3 (three) times daily.        . mesalamine (CANASA) 1000 MG suppository Place 1 suppository (1,000 mg total) rectally 2 (two) times daily.  30 suppository  0  . Methylcellulose, Laxative, (CITRUCEL) 500 MG TABS Take 2 tablets by mouth 2 (two) times daily.       . minocycline (MINOCIN,DYNACIN) 100 MG capsule Take 100 mg by mouth daily as needed.        . multivitamin (THERAGRAN) per tablet Take 1 tablet by mouth daily.        . Omega-3 Fatty Acids (FISH OIL) 1000 MG CAPS Take 2 capsules by mouth daily.        Marland Kitchen omeprazole (PRILOSEC) 20 MG capsule Take 20 mg by mouth daily as needed.       . pimecrolimus (ELIDEL) 1 % cream Apply topically 2 (two) times daily as needed.        . rosuvastatin (CRESTOR) 20 MG tablet  Take 20 mg by mouth once a week.          BP 136/80  Pulse 80  Temp 98.1 F (36.7 C)  Resp 16  Ht 5\' 9"  (1.753 m)  Wt 155 lb (70.308 kg)  BMI 22.89 kg/m2       Objective:   Physical Exam  Nursing note and vitals reviewed. Constitutional: He appears well-developed and well-nourished.  HENT:  Head: Normocephalic and atraumatic.  Eyes: Conjunctivae are normal. Pupils are equal, round, and reactive to light.  Neck: Normal range of motion. Neck supple.  Cardiovascular: Normal rate and regular rhythm.   Pulmonary/Chest: Effort normal and breath sounds normal.  Abdominal: Soft. Bowel sounds are normal.          Assessment & Plan:  Most probably he has a bacterial upper respiratory tract infection most probably bacterial sinusitis given the  extent of the erythema of his posterior pharyngeal wall and the drainage is both from his sinuses as well as the erythema and swelling of the sinus passages with some eustachian tube dysfunction.  Due to history of palpitations would avoid decongestants steroids but will consider a course of Astelin along with an oral antibiotic

## 2011-12-14 ENCOUNTER — Other Ambulatory Visit: Payer: 59

## 2011-12-21 ENCOUNTER — Ambulatory Visit: Payer: 59 | Admitting: Internal Medicine

## 2012-01-08 ENCOUNTER — Other Ambulatory Visit: Payer: Self-pay | Admitting: Gastroenterology

## 2012-01-11 ENCOUNTER — Other Ambulatory Visit: Payer: Self-pay | Admitting: Gastroenterology

## 2012-01-11 NOTE — Telephone Encounter (Signed)
Patient states the last time he was seen which was 09/2010 he was told by Dr. Russella Dar that he does not need to come back for a f/u unless he is having problems. I informed patient that looking at his last office visit note there was not any mention of a follow- up visit but Dr. Russella Dar does like to see his patients every year for prescription refills or if he would like to get this medication from his PCP that would be fine also. Patient states he would like for me to ask Dr. Russella Dar to verify again this because he feels like that he does not need to come into the office if he has not had any problems and that patient wants to continue to get his medicine from Dr. Russella Dar. Please advise if I can give him another year worth of refills or does patient need an office visit.

## 2012-01-11 NOTE — Telephone Encounter (Signed)
As per usual routine, I see patients at least yearly (sometimes more often) if there are any prescription that I refill. If they transfer their refills to their PCP, and their PCP agrees, I do not need to see him.  After review of his last office visit in 09/2010 it seems like the only medication would be glycopyrrolate? We can refill for 6 months to allow him to transition his refill to his PCP or he can schedule an REV. It is generally easiest for patients with mild, stable GI problems like he has to have PCP refill meds and eliminate the need for office visits with me.

## 2012-01-12 MED ORDER — GLYCOPYRROLATE 2 MG PO TABS: ORAL_TABLET | ORAL | Status: DC

## 2012-01-12 NOTE — Telephone Encounter (Signed)
Informed patient again that he can get his prescription refills for glycopyrrolate from his PCP if he does see them more often. Informed him that we can give him refills to while he transitions refills to PCP. Pt states he does see Dr. Lovell Sheehan in 2 weeks for a complete physical so he just needs one refill until he sees his PCP and he will continue to get them every year from him. Told him that I will send one refill to his pharmacy. Pt verbalized understanding.

## 2012-01-26 ENCOUNTER — Other Ambulatory Visit (INDEPENDENT_AMBULATORY_CARE_PROVIDER_SITE_OTHER): Payer: 59

## 2012-01-26 ENCOUNTER — Other Ambulatory Visit: Payer: 59

## 2012-01-26 DIAGNOSIS — Z0000 Encounter for general adult medical examination without abnormal findings: Secondary | ICD-10-CM

## 2012-01-26 LAB — CBC WITH DIFFERENTIAL/PLATELET
Basophils Absolute: 0 10*3/uL (ref 0.0–0.1)
Eosinophils Relative: 1.5 % (ref 0.0–5.0)
HCT: 50.8 % (ref 39.0–52.0)
Hemoglobin: 16.9 g/dL (ref 13.0–17.0)
Lymphocytes Relative: 27.9 % (ref 12.0–46.0)
Monocytes Relative: 7.7 % (ref 3.0–12.0)
Neutro Abs: 4.9 10*3/uL (ref 1.4–7.7)
RDW: 13.4 % (ref 11.5–14.6)
WBC: 7.9 10*3/uL (ref 4.5–10.5)

## 2012-01-26 LAB — BASIC METABOLIC PANEL
BUN: 22 mg/dL (ref 6–23)
CO2: 24 mEq/L (ref 19–32)
Chloride: 107 mEq/L (ref 96–112)
Glucose, Bld: 92 mg/dL (ref 70–99)
Potassium: 4.5 mEq/L (ref 3.5–5.1)
Sodium: 138 mEq/L (ref 135–145)

## 2012-01-26 LAB — HEPATIC FUNCTION PANEL
ALT: 16 U/L (ref 0–53)
AST: 22 U/L (ref 0–37)
Alkaline Phosphatase: 47 U/L (ref 39–117)
Bilirubin, Direct: 0.1 mg/dL (ref 0.0–0.3)
Total Bilirubin: 1 mg/dL (ref 0.3–1.2)
Total Protein: 7 g/dL (ref 6.0–8.3)

## 2012-01-26 LAB — LIPID PANEL
LDL Cholesterol: 93 mg/dL (ref 0–99)
Total CHOL/HDL Ratio: 4
Triglycerides: 123 mg/dL (ref 0.0–149.0)

## 2012-01-26 LAB — TSH: TSH: 2.15 u[IU]/mL (ref 0.35–5.50)

## 2012-01-27 LAB — VITAMIN D 25 HYDROXY (VIT D DEFICIENCY, FRACTURES): Vit D, 25-Hydroxy: 39 ng/mL (ref 30–89)

## 2012-02-06 ENCOUNTER — Ambulatory Visit (INDEPENDENT_AMBULATORY_CARE_PROVIDER_SITE_OTHER): Payer: 59 | Admitting: Internal Medicine

## 2012-02-06 ENCOUNTER — Encounter: Payer: Self-pay | Admitting: Internal Medicine

## 2012-02-06 VITALS — BP 126/80 | HR 84 | Temp 98.6°F | Resp 16 | Ht 67.0 in | Wt 154.0 lb

## 2012-02-06 DIAGNOSIS — K648 Other hemorrhoids: Secondary | ICD-10-CM

## 2012-02-06 DIAGNOSIS — J309 Allergic rhinitis, unspecified: Secondary | ICD-10-CM

## 2012-02-06 DIAGNOSIS — H1010 Acute atopic conjunctivitis, unspecified eye: Secondary | ICD-10-CM

## 2012-02-06 DIAGNOSIS — Z0000 Encounter for general adult medical examination without abnormal findings: Secondary | ICD-10-CM

## 2012-02-06 DIAGNOSIS — K649 Unspecified hemorrhoids: Secondary | ICD-10-CM

## 2012-02-06 MED ORDER — GLYCOPYRROLATE 2 MG PO TABS: ORAL_TABLET | ORAL | Status: DC

## 2012-02-06 MED ORDER — HYDROCORTISONE ACE-PRAMOXINE 2.5-1 % RE CREA: TOPICAL_CREAM | Freq: Three times a day (TID) | RECTAL | Status: DC

## 2012-02-06 MED ORDER — FEXOFENADINE HCL 180 MG PO TABS: 180.0000 mg | ORAL_TABLET | Freq: Every day | ORAL | Status: DC

## 2012-02-06 NOTE — Patient Instructions (Signed)
Gluten free diet 

## 2012-02-06 NOTE — Progress Notes (Signed)
Subjective:    Patient ID: Tyler [name], male    DOB: [birth year], 56 y.o.   MRN: [id number]  HPI Intermitant IBS symptoms with alternating diarrhea and constipation Wonders about gluten intolerance Cannot relate to gluten but has not followed this.  Anxiety stable Hx of hemmorhoids      Review of Systems  Constitutional: Negative for fever and fatigue.  HENT: Negative for hearing loss, congestion, neck pain and postnasal drip.   Eyes: Negative for discharge, redness and visual disturbance.  Respiratory: Negative for cough, shortness of breath and wheezing.   Cardiovascular: Negative for leg swelling.  Gastrointestinal: Negative for abdominal pain, constipation and abdominal distention.  Genitourinary: Negative for urgency and frequency.  Musculoskeletal: Negative for joint swelling and arthralgias.  Skin: Negative for color change and rash.  Neurological: Negative for weakness and light-headedness.  Hematological: Negative for adenopathy.  Psychiatric/Behavioral: Negative for behavioral problems.   Past Medical History  Diagnosis Date  . Colon polyps   . Hemorrhoids   . GERD (gastroesophageal reflux disease)   . Anxiety   . Asthma   . Viral warts, unspecified   . Hyperlipidemia   . Rosacea     History   Social History  . Marital Status: Married    Spouse Name: N/A    Number of Children: N/A  . Years of Education: N/A   Occupational History  . Not on file.   Social History Main Topics  . Smoking status: Never Smoker   . Smokeless tobacco: Not on file  . Alcohol Use: No  . Drug Use: No  . Sexually Active: Not on file   Other Topics Concern  . Not on file   Social History Narrative   Regular exercise-uses caffeine    No past surgical history on file.  Family History  Problem Relation Age of Onset  . Coronary artery disease    . Stomach cancer Maternal Uncle     Allergies  Allergen Reactions  . Penicillins     Current Outpatient  Prescriptions on File Prior to Visit  Medication Sig Dispense Refill  . ALPRAZolam (XANAX) 0.25 MG tablet Take 1 tablet (0.25 mg total) by mouth 3 (three) times daily as needed for sleep.  30 tablet  2  . aspirin 81 MG EC tablet Take 81 mg by mouth daily.        . fexofenadine-pseudoephedrine (ALLEGRA-D 24) 180-240 MG per 24 hr tablet Take 1 tablet by mouth daily as needed.        Marland Kitchen glycopyrrolate (ROBINUL) 2 MG tablet TAKE 1 TABLET BY MOUTH TWICE DAILY AS NEEDED  60 tablet  0  . hydrocortisone-pramoxine (ANALPRAM-HC) 2.5-1 % rectal cream Place rectally 3 (three) times daily.        . mesalamine (CANASA) 1000 MG suppository Place 1 suppository (1,000 mg total) rectally 2 (two) times daily.  30 suppository  0  . Methylcellulose, Laxative, (CITRUCEL) 500 MG TABS Take 2 tablets by mouth 2 (two) times daily.       . minocycline (MINOCIN,DYNACIN) 100 MG capsule Take 100 mg by mouth daily as needed.        . multivitamin (THERAGRAN) per tablet Take 1 tablet by mouth daily.        . Omega-3 Fatty Acids (FISH OIL) 1000 MG CAPS Take 2 capsules by mouth daily.        . pimecrolimus (ELIDEL) 1 % cream Apply topically 2 (two) times daily as needed.        Marland Kitchen  rosuvastatin (CRESTOR) 20 MG tablet Take 20 mg by mouth once a week.          BP 126/80  Pulse 84  Temp 98.6 F (37 C)  Resp 16  Ht 5\' 7"  (1.702 m)  Wt 154 lb (69.854 kg)  BMI 24.12 kg/m2       Objective:   Physical Exam  Nursing note and vitals reviewed. Constitutional: He is oriented to person, place, and time. He appears well-developed and well-nourished.  HENT:  Head: Normocephalic and atraumatic.  Eyes: Conjunctivae are normal. Pupils are equal, round, and reactive to light.  Neck: Normal range of motion. Neck supple.  Cardiovascular: Normal rate and regular rhythm.   Pulmonary/Chest: Effort normal and breath sounds normal.  Abdominal: Soft. Bowel sounds are normal.  Genitourinary: Rectum normal and prostate normal.    Musculoskeletal: Normal range of motion.  Neurological: He is alert and oriented to person, place, and time.  Skin: Skin is warm and dry.  Psychiatric: He has a normal mood and affect. His behavior is normal.          Assessment & Plan:   Patient presents for yearly preventative medicine examination.   all immunizations and health maintenance protocols were reviewed with the patient and they are up to date with these protocols.   screening laboratory values were reviewed with the patient including screening of hyperlipidemia PSA renal function and hepatic function.   There medications past medical history social history problem list and allergies were reviewed in detail.   Goals were established with regard to weight loss exercise diet in compliance with medications  Keep on the saw palmeto for the BPH  Reviewed principles of a gluten free diet to see if this will help his irritable bowel symptoms if this does not consider referral to GI for an upper GI.  Refill medications for his colitis

## 2012-03-08 ENCOUNTER — Telehealth: Payer: Self-pay | Admitting: Internal Medicine

## 2012-03-08 NOTE — Telephone Encounter (Signed)
Patient called requesting samples of crestor. Please assist.

## 2012-03-08 NOTE — Telephone Encounter (Signed)
Pt informed samples up front 

## 2012-05-03 ENCOUNTER — Telehealth: Payer: Self-pay | Admitting: Family Medicine

## 2012-05-03 MED ORDER — PIMECROLIMUS 1 % EX CREA: TOPICAL_CREAM | Freq: Two times a day (BID) | CUTANEOUS | Status: DC | PRN

## 2012-05-03 NOTE — Telephone Encounter (Signed)
[facility] on [address] called and said the pt was expecting [place] to call in his pimecrolimus (ELIDEL) 1 % cream I see it on the med list, but it was entered in 2012, and [facility] has never filled it for him. Please advise or send in. Thanks, Gorman!

## 2012-05-03 NOTE — Telephone Encounter (Signed)
Will you be able to send in, or should I sched appt for pt to discuss? Thanks!

## 2012-05-03 NOTE — Telephone Encounter (Signed)
Never received anything requesting this med, although  His wife was here yesterday and she may have told dr j to send in and he forgot to tell me

## 2012-05-04 NOTE — Telephone Encounter (Signed)
I sent in yesterday

## 2012-05-10 ENCOUNTER — Ambulatory Visit: Payer: 59 | Admitting: Internal Medicine

## 2012-05-30 ENCOUNTER — Ambulatory Visit: Payer: 59 | Admitting: Internal Medicine

## 2012-05-31 ENCOUNTER — Ambulatory Visit (INDEPENDENT_AMBULATORY_CARE_PROVIDER_SITE_OTHER): Payer: 59 | Admitting: Internal Medicine

## 2012-05-31 ENCOUNTER — Encounter: Payer: Self-pay | Admitting: Internal Medicine

## 2012-05-31 VITALS — BP 160/90 | HR 90 | Temp 98.2°F | Resp 16 | Ht 67.0 in | Wt 160.0 lb

## 2012-05-31 DIAGNOSIS — R030 Elevated blood-pressure reading, without diagnosis of hypertension: Secondary | ICD-10-CM

## 2012-05-31 DIAGNOSIS — F411 Generalized anxiety disorder: Secondary | ICD-10-CM

## 2012-05-31 DIAGNOSIS — E785 Hyperlipidemia, unspecified: Secondary | ICD-10-CM

## 2012-05-31 MED ORDER — BISOPROLOL FUMARATE 5 MG PO TABS: 2.5000 mg | ORAL_TABLET | Freq: Every day | ORAL | Status: DC

## 2012-05-31 NOTE — Progress Notes (Signed)
Subjective:    Patient ID: Tyler [name], male    DOB: [birth year], 56 y.o.   MRN: [id number]  HPI Facial flushing and elevated HTN Has increasd GERD Elevated HR and anxiety Reducing gluten and GI issues reduced   Review of Systems  Constitutional: Negative for fever and fatigue.  HENT: Negative for hearing loss, congestion, neck pain and postnasal drip.   Eyes: Negative for discharge, redness and visual disturbance.  Respiratory: Negative for cough, shortness of breath and wheezing.   Cardiovascular: Negative for leg swelling.  Gastrointestinal: Negative for abdominal pain, constipation and abdominal distention.  Genitourinary: Negative for urgency and frequency.  Musculoskeletal: Negative for joint swelling and arthralgias.  Skin: Negative for color change and rash.  Neurological: Negative for weakness and light-headedness.  Hematological: Negative for adenopathy.  Psychiatric/Behavioral: Negative for behavioral problems.   Past Medical History  Diagnosis Date  . Colon polyps   . Hemorrhoids   . GERD (gastroesophageal reflux disease)   . Anxiety   . Asthma   . Viral warts, unspecified   . Hyperlipidemia   . Rosacea     History   Social History  . Marital Status: Married    Spouse Name: N/A    Number of Children: N/A  . Years of Education: N/A   Occupational History  . Not on file.   Social History Main Topics  . Smoking status: Never Smoker   . Smokeless tobacco: Not on file  . Alcohol Use: No  . Drug Use: No  . Sexually Active: Not on file   Other Topics Concern  . Not on file   Social History Narrative   Regular exercise-uses caffeine    No past surgical history on file.  Family History  Problem Relation Age of Onset  . Coronary artery disease    . Stomach cancer Maternal Uncle     Allergies  Allergen Reactions  . Penicillins     Current Outpatient Prescriptions on File Prior to Visit  Medication Sig Dispense Refill  . ALPRAZolam  (XANAX) 0.25 MG tablet Take 1 tablet (0.25 mg total) by mouth 3 (three) times daily as needed for sleep.  30 tablet  2  . aspirin 81 MG EC tablet Take 81 mg by mouth daily.        . fexofenadine (ALLEGRA) 180 MG tablet Take 1 tablet (180 mg total) by mouth daily.      Marland Kitchen glycopyrrolate (ROBINUL) 2 MG tablet TAKE 1 TABLET BY MOUTH TWICE DAILY AS NEEDED  60 tablet  4  . hydrocortisone-pramoxine (ANALPRAM-HC) 2.5-1 % rectal cream Place rectally 3 (three) times daily.  30 g  6  . mesalamine (CANASA) 1000 MG suppository Place 1 suppository (1,000 mg total) rectally 2 (two) times daily.  30 suppository  0  . Methylcellulose, Laxative, (CITRUCEL) 500 MG TABS Take 2 tablets by mouth 2 (two) times daily.       . minocycline (MINOCIN,DYNACIN) 100 MG capsule Take 100 mg by mouth daily as needed.        . multivitamin (THERAGRAN) per tablet Take 1 tablet by mouth daily.        . Omega-3 Fatty Acids (FISH OIL) 1000 MG CAPS Take 2 capsules by mouth daily.        . pimecrolimus (ELIDEL) 1 % cream Apply topically 2 (two) times daily as needed.  30 g  3  . rosuvastatin (CRESTOR) 20 MG tablet Take 20 mg by mouth once a week.        Marland Kitchen  bisoprolol (ZEBETA) 5 MG tablet Take 0.5 tablets (2.5 mg total) by mouth daily.  15 tablet  6    BP 160/90  Pulse 90  Temp 98.2 F (36.8 C)  Resp 16  Ht 5\' 7"  (1.702 m)  Wt 160 lb (72.576 kg)  BMI 25.06 kg/m2       Objective:   Physical Exam  Nursing note and vitals reviewed. Constitutional: He appears well-developed and well-nourished.  HENT:  Head: Normocephalic and atraumatic.  Eyes: Conjunctivae normal are normal. Pupils are equal, round, and reactive to light.  Neck: Normal range of motion. Neck supple.  Cardiovascular: Normal rate and regular rhythm.   Pulmonary/Chest: Effort normal and breath sounds normal.  Abdominal: Soft. Bowel sounds are normal.          Assessment & Plan:  Add BB Discussed Gluten revied IBS improvement with gluten reducrtion

## 2012-05-31 NOTE — Patient Instructions (Signed)
The patient is instructed to continue all medications as prescribed. Schedule followup with check out clerk upon leaving the clinic  

## 2012-07-10 ENCOUNTER — Ambulatory Visit (INDEPENDENT_AMBULATORY_CARE_PROVIDER_SITE_OTHER): Payer: 59 | Admitting: Internal Medicine

## 2012-07-10 ENCOUNTER — Encounter: Payer: Self-pay | Admitting: Internal Medicine

## 2012-07-10 VITALS — BP 130/80 | HR 84 | Temp 98.0°F | Resp 16 | Ht 67.0 in | Wt 158.0 lb

## 2012-07-10 DIAGNOSIS — I10 Essential (primary) hypertension: Secondary | ICD-10-CM

## 2012-07-10 NOTE — Progress Notes (Signed)
Subjective:    Patient ID: Tyler [name], male    DOB: 03/12/1956, 56 y.o.   MRN: [id number]  HPI Tolerating the BB with good results after adjusting to the dose. His blood pressure is well controlled Discussed the long term use of BB. Related the blood pressure to the anxiety and OCD. DIscussed referral to Caralyn Guile   Review of Systems  Constitutional: Negative for fever and fatigue.  HENT: Negative for hearing loss, congestion, neck pain and postnasal drip.   Eyes: Negative for discharge, redness and visual disturbance.  Respiratory: Negative for cough, shortness of breath and wheezing.   Cardiovascular: Negative for leg swelling.  Gastrointestinal: Negative for abdominal pain, constipation and abdominal distention.  Genitourinary: Negative for urgency and frequency.  Musculoskeletal: Negative for joint swelling and arthralgias.  Skin: Negative for color change and rash.  Neurological: Negative for weakness and light-headedness.  Hematological: Negative for adenopathy.  Psychiatric/Behavioral: Negative for behavioral problems.   Past Medical History  Diagnosis Date  . Colon polyps   . Hemorrhoids   . GERD (gastroesophageal reflux disease)   . Anxiety   . Asthma   . Viral warts, unspecified   . Hyperlipidemia   . Rosacea     History   Social History  . Marital Status: Married    Spouse Name: N/A    Number of Children: N/A  . Years of Education: N/A   Occupational History  . Not on file.   Social History Main Topics  . Smoking status: Never Smoker   . Smokeless tobacco: Not on file  . Alcohol Use: No  . Drug Use: No  . Sexually Active: Not on file   Other Topics Concern  . Not on file   Social History Narrative   Regular exercise-uses caffeine    No past surgical history on file.  Family History  Problem Relation Age of Onset  . Coronary artery disease    . Stomach cancer Maternal Uncle     Allergies  Allergen Reactions  . Penicillins      Current Outpatient Prescriptions on File Prior to Visit  Medication Sig Dispense Refill  . ALPRAZolam (XANAX) 0.25 MG tablet Take 1 tablet (0.25 mg total) by mouth 3 (three) times daily as needed for sleep.  30 tablet  2  . aspirin 81 MG EC tablet Take 81 mg by mouth daily.        . bisoprolol (ZEBETA) 5 MG tablet Take 0.5 tablets (2.5 mg total) by mouth daily.  15 tablet  6  . fexofenadine (ALLEGRA) 180 MG tablet Take 1 tablet (180 mg total) by mouth daily.      Marland Kitchen glycopyrrolate (ROBINUL) 2 MG tablet TAKE 1 TABLET BY MOUTH TWICE DAILY AS NEEDED  60 tablet  4  . hydrocortisone-pramoxine (ANALPRAM-HC) 2.5-1 % rectal cream Place rectally 3 (three) times daily.  30 g  6  . mesalamine (CANASA) 1000 MG suppository Place 1 suppository (1,000 mg total) rectally 2 (two) times daily.  30 suppository  0  . Methylcellulose, Laxative, (CITRUCEL) 500 MG TABS Take 2 tablets by mouth 2 (two) times daily.       . minocycline (MINOCIN,DYNACIN) 100 MG capsule Take 100 mg by mouth daily as needed.        . multivitamin (THERAGRAN) per tablet Take 1 tablet by mouth daily.        . Omega-3 Fatty Acids (FISH OIL) 1000 MG CAPS Take 2 capsules by mouth daily.        Marland Kitchen  pimecrolimus (ELIDEL) 1 % cream Apply topically 2 (two) times daily as needed.  30 g  3  . rosuvastatin (CRESTOR) 20 MG tablet Take 20 mg by mouth once a week.          BP 130/80  Pulse 84  Temp 98 F (36.7 C)  Resp 16  Ht 5\' 7"  (1.702 m)  Wt 158 lb (71.668 kg)  BMI 24.75 kg/m2       Objective:   Physical Exam  Constitutional: He is oriented to person, place, and time. He appears well-developed and well-nourished.  HENT:  Head: Normocephalic and atraumatic.  Eyes: Conjunctivae normal are normal. Pupils are equal, round, and reactive to light.  Neck: Normal range of motion. Neck supple.  Cardiovascular: Normal rate and regular rhythm.   Pulmonary/Chest: Effort normal and breath sounds normal.  Abdominal: Soft. Bowel sounds are  normal.  Musculoskeletal: Normal range of motion.  Neurological: He is alert and oriented to person, place, and time.          Assessment & Plan:  Refer to Dr Dellia Cloud for anxiety control Stable blood pressure

## 2012-08-11 ENCOUNTER — Other Ambulatory Visit: Payer: Self-pay | Admitting: Internal Medicine

## 2012-09-19 ENCOUNTER — Ambulatory Visit (INDEPENDENT_AMBULATORY_CARE_PROVIDER_SITE_OTHER): Payer: 59 | Admitting: Psychology

## 2012-09-19 DIAGNOSIS — F411 Generalized anxiety disorder: Secondary | ICD-10-CM

## 2012-09-21 ENCOUNTER — Other Ambulatory Visit: Payer: Self-pay | Admitting: Internal Medicine

## 2012-09-28 ENCOUNTER — Ambulatory Visit (INDEPENDENT_AMBULATORY_CARE_PROVIDER_SITE_OTHER): Payer: 59 | Admitting: Psychology

## 2012-09-28 DIAGNOSIS — F411 Generalized anxiety disorder: Secondary | ICD-10-CM

## 2012-10-15 ENCOUNTER — Ambulatory Visit (INDEPENDENT_AMBULATORY_CARE_PROVIDER_SITE_OTHER): Payer: 59 | Admitting: Psychology

## 2012-10-15 DIAGNOSIS — F411 Generalized anxiety disorder: Secondary | ICD-10-CM

## 2012-10-30 ENCOUNTER — Other Ambulatory Visit: Payer: Self-pay | Admitting: Internal Medicine

## 2012-11-06 ENCOUNTER — Ambulatory Visit (INDEPENDENT_AMBULATORY_CARE_PROVIDER_SITE_OTHER): Payer: 59 | Admitting: Psychology

## 2012-11-06 DIAGNOSIS — F411 Generalized anxiety disorder: Secondary | ICD-10-CM

## 2012-11-19 ENCOUNTER — Ambulatory Visit (INDEPENDENT_AMBULATORY_CARE_PROVIDER_SITE_OTHER): Payer: 59 | Admitting: Psychology

## 2012-11-19 DIAGNOSIS — F411 Generalized anxiety disorder: Secondary | ICD-10-CM

## 2012-11-29 ENCOUNTER — Ambulatory Visit (INDEPENDENT_AMBULATORY_CARE_PROVIDER_SITE_OTHER): Payer: 59 | Admitting: Psychology

## 2012-11-29 DIAGNOSIS — F411 Generalized anxiety disorder: Secondary | ICD-10-CM

## 2012-12-13 ENCOUNTER — Ambulatory Visit (INDEPENDENT_AMBULATORY_CARE_PROVIDER_SITE_OTHER): Payer: 59 | Admitting: Psychology

## 2012-12-13 DIAGNOSIS — F411 Generalized anxiety disorder: Secondary | ICD-10-CM

## 2012-12-16 ENCOUNTER — Other Ambulatory Visit: Payer: Self-pay | Admitting: Internal Medicine

## 2012-12-18 ENCOUNTER — Other Ambulatory Visit: Payer: Self-pay | Admitting: Internal Medicine

## 2013-01-09 ENCOUNTER — Telehealth: Payer: Self-pay | Admitting: Internal Medicine

## 2013-01-09 MED ORDER — ROSUVASTATIN CALCIUM 20 MG PO TABS: ORAL_TABLET | ORAL | Status: DC

## 2013-01-09 NOTE — Telephone Encounter (Signed)
Pt would like samples of rosuvastatin (CRESTOR) 20 MG tablet. Pt states Dr Lovell Sheehan usually gives to him. He only takes about one a week. Pt has appt in September.

## 2013-01-16 ENCOUNTER — Other Ambulatory Visit: Payer: Self-pay | Admitting: Internal Medicine

## 2013-01-25 ENCOUNTER — Other Ambulatory Visit: Payer: Self-pay | Admitting: Internal Medicine

## 2013-03-14 ENCOUNTER — Ambulatory Visit (INDEPENDENT_AMBULATORY_CARE_PROVIDER_SITE_OTHER): Payer: 59 | Admitting: Psychology

## 2013-03-14 DIAGNOSIS — F411 Generalized anxiety disorder: Secondary | ICD-10-CM

## 2013-04-15 ENCOUNTER — Other Ambulatory Visit (INDEPENDENT_AMBULATORY_CARE_PROVIDER_SITE_OTHER): Payer: 59

## 2013-04-15 DIAGNOSIS — Z0000 Encounter for general adult medical examination without abnormal findings: Secondary | ICD-10-CM

## 2013-04-15 LAB — POCT URINALYSIS DIPSTICK
Bilirubin, UA: NEGATIVE
Glucose, UA: NEGATIVE
Nitrite, UA: NEGATIVE
Spec Grav, UA: 1.02

## 2013-04-15 LAB — CBC WITH DIFFERENTIAL/PLATELET
Basophils Relative: 0.6 % (ref 0.0–3.0)
Eosinophils Absolute: 0.1 10*3/uL (ref 0.0–0.7)
Eosinophils Relative: 2.2 % (ref 0.0–5.0)
HCT: 47.9 % (ref 39.0–52.0)
Lymphs Abs: 2 10*3/uL (ref 0.7–4.0)
MCHC: 33.6 g/dL (ref 30.0–36.0)
MCV: 88.2 fl (ref 78.0–100.0)
Monocytes Absolute: 0.5 10*3/uL (ref 0.1–1.0)
Neutro Abs: 3.8 10*3/uL (ref 1.4–7.7)
RBC: 5.43 Mil/uL (ref 4.22–5.81)
WBC: 6.5 10*3/uL (ref 4.5–10.5)

## 2013-04-15 LAB — BASIC METABOLIC PANEL
BUN: 20 mg/dL (ref 6–23)
CO2: 26 mEq/L (ref 19–32)
Chloride: 108 mEq/L (ref 96–112)
Potassium: 4.2 mEq/L (ref 3.5–5.1)

## 2013-04-15 LAB — PSA: PSA: 1.09 ng/mL (ref 0.10–4.00)

## 2013-04-15 LAB — HEPATIC FUNCTION PANEL
AST: 18 U/L (ref 0–37)
Total Bilirubin: 0.9 mg/dL (ref 0.3–1.2)

## 2013-04-15 LAB — TSH: TSH: 1.43 u[IU]/mL (ref 0.35–5.50)

## 2013-04-15 LAB — LIPID PANEL
LDL Cholesterol: 62 mg/dL (ref 0–99)
Total CHOL/HDL Ratio: 3

## 2013-04-22 ENCOUNTER — Encounter: Payer: Self-pay | Admitting: Internal Medicine

## 2013-04-22 ENCOUNTER — Ambulatory Visit (INDEPENDENT_AMBULATORY_CARE_PROVIDER_SITE_OTHER): Payer: 59 | Admitting: Internal Medicine

## 2013-04-22 VITALS — BP 140/80 | HR 76 | Temp 98.2°F | Resp 16 | Ht 67.0 in | Wt 153.0 lb

## 2013-04-22 DIAGNOSIS — Z23 Encounter for immunization: Secondary | ICD-10-CM

## 2013-04-22 DIAGNOSIS — Z0000 Encounter for general adult medical examination without abnormal findings: Secondary | ICD-10-CM

## 2013-04-22 NOTE — Progress Notes (Signed)
Subjective:    Patient ID: Tyler [name], male    DOB: 05/16/1956, 57 y.o.   MRN: [id number]  HPI White coat cpx  Review of Systems  Constitutional: Negative for fever and fatigue.  HENT: Negative for hearing loss, congestion, neck pain and postnasal drip.   Eyes: Negative for discharge, redness and visual disturbance.  Respiratory: Negative for cough, shortness of breath and wheezing.   Cardiovascular: Negative for leg swelling.  Gastrointestinal: Negative for abdominal pain, constipation and abdominal distention.  Genitourinary: Negative for urgency and frequency.  Musculoskeletal: Negative for joint swelling and arthralgias.  Skin: Negative for color change and rash.  Neurological: Negative for weakness and light-headedness.  Hematological: Negative for adenopathy.  Psychiatric/Behavioral: Negative for behavioral problems.   Past Medical History  Diagnosis Date  . Colon polyps   . Hemorrhoids   . GERD (gastroesophageal reflux disease)   . Anxiety   . Asthma   . Viral warts, unspecified   . Hyperlipidemia   . Rosacea     History   Social History  . Marital Status: Married    Spouse Name: N/A    Number of Children: N/A  . Years of Education: N/A   Occupational History  . Not on file.   Social History Main Topics  . Smoking status: Never Smoker   . Smokeless tobacco: Not on file  . Alcohol Use: No  . Drug Use: No  . Sexual Activity: Not on file   Other Topics Concern  . Not on file   Social History Narrative   Regular exercise-uses caffeine    History reviewed. No pertinent past surgical history.  Family History  Problem Relation Age of Onset  . Coronary artery disease    . Stomach cancer Maternal Uncle     Allergies  Allergen Reactions  . Penicillins     Current Outpatient Prescriptions on File Prior to Visit  Medication Sig Dispense Refill  . aspirin 81 MG EC tablet Take 81 mg by mouth daily.        . bisoprolol (ZEBETA) 5 MG tablet TAKE  1/2 TABLET BY MOUTH DAILY  15 tablet  11  . glycopyrrolate (ROBINUL) 2 MG tablet TAKE 1 TABLET BY MOUTH TWICE DAILY AS NEEDED.  60 tablet  3  . hydrocortisone-pramoxine (ANALPRAM-HC) 2.5-1 % rectal cream Place rectally 3 (three) times daily.  30 g  6  . mesalamine (CANASA) 1000 MG suppository Place 1 suppository (1,000 mg total) rectally 2 (two) times daily.  30 suppository  0  . Methylcellulose, Laxative, (CITRUCEL) 500 MG TABS Take 2 tablets by mouth 2 (two) times daily.       . minocycline (MINOCIN,DYNACIN) 100 MG capsule Take 100 mg by mouth daily as needed.        . multivitamin (THERAGRAN) per tablet Take 1 tablet by mouth daily.        . Omega-3 Fatty Acids (FISH OIL) 1000 MG CAPS Take 2 capsules by mouth daily.        . pimecrolimus (ELIDEL) 1 % cream Apply topically 2 (two) times daily as needed.  30 g  3  . rosuvastatin (CRESTOR) 20 MG tablet Take 20 mg by mouth once a week.        . fexofenadine (ALLEGRA) 180 MG tablet Take 1 tablet (180 mg total) by mouth daily.       No current facility-administered medications on file prior to visit.    BP 140/80  Pulse 76  Temp(Src) 98.2 F (36.8  C)  Resp 16  Ht 5\' 7"  (1.702 m)  Wt 153 lb (69.4 kg)  BMI 23.96 kg/m2       Objective:   Physical Exam  Constitutional: He is oriented to person, place, and time. He appears well-developed and well-nourished.  HENT:  Head: Normocephalic and atraumatic.  Eyes: Conjunctivae are normal. Pupils are equal, round, and reactive to light.  Neck: Normal range of motion. Neck supple.  Cardiovascular: Normal rate and regular rhythm.   Pulmonary/Chest: Effort normal and breath sounds normal.  Abdominal: Soft. Bowel sounds are normal.  Musculoskeletal: Normal range of motion.  Neurological: He is alert and oriented to person, place, and time.  Skin: Skin is warm and dry.  Psychiatric: He has a normal mood and affect. His behavior is normal.          Assessment & Plan:   Patient presents for  yearly preventative medicine examination.   all immunizations and health maintenance protocols were reviewed with the patient and they are up to date with these protocols.   screening laboratory values were reviewed with the patient including screening of hyperlipidemia PSA renal function and hepatic function.   There medications past medical history social history problem list and allergies were reviewed in detail.   Goals were established with regard to weight loss exercise diet in compliance with medications  Relaxation and stress.

## 2013-04-22 NOTE — Patient Instructions (Signed)
The patient is instructed to continue all medications as prescribed. Schedule followup with check out clerk upon leaving the clinic  

## 2013-05-14 ENCOUNTER — Telehealth: Payer: Self-pay | Admitting: Internal Medicine

## 2013-05-14 MED ORDER — ALPRAZOLAM 0.25 MG PO TABS: 0.2500 mg | ORAL_TABLET | Freq: Three times a day (TID) | ORAL | Status: DC | PRN

## 2013-05-14 NOTE — Telephone Encounter (Signed)
DONE

## 2013-05-14 NOTE — Telephone Encounter (Signed)
Pt needs new rx alprazolam 0.25mg  up to 3 pills daily#90 with refills call into [facility]

## 2013-07-28 ENCOUNTER — Other Ambulatory Visit: Payer: Self-pay | Admitting: Internal Medicine

## 2013-10-03 ENCOUNTER — Encounter: Payer: Self-pay | Admitting: Internal Medicine

## 2013-10-05 ENCOUNTER — Encounter: Payer: Self-pay | Admitting: Internal Medicine

## 2013-10-05 ENCOUNTER — Ambulatory Visit (INDEPENDENT_AMBULATORY_CARE_PROVIDER_SITE_OTHER): Payer: 59 | Admitting: Family Medicine

## 2013-10-05 ENCOUNTER — Other Ambulatory Visit: Payer: Self-pay | Admitting: Family Medicine

## 2013-10-05 ENCOUNTER — Encounter: Payer: Self-pay | Admitting: Family Medicine

## 2013-10-05 VITALS — BP 130/80 | HR 91 | Temp 97.1°F | Ht 67.0 in | Wt 157.5 lb

## 2013-10-05 DIAGNOSIS — R319 Hematuria, unspecified: Secondary | ICD-10-CM

## 2013-10-05 DIAGNOSIS — R350 Frequency of micturition: Secondary | ICD-10-CM

## 2013-10-05 HISTORY — DX: Hematuria, unspecified: R31.9

## 2013-10-05 LAB — POCT URINALYSIS DIPSTICK
Bilirubin, UA: NEGATIVE
Glucose, UA: NEGATIVE
Ketones, UA: NEGATIVE
Leukocytes, UA: NEGATIVE
Nitrite, UA: NEGATIVE
PROTEIN UA: NEGATIVE
SPEC GRAV UA: 1.02
UROBILINOGEN UA: NEGATIVE
pH, UA: 6

## 2013-10-05 MED ORDER — CIPROFLOXACIN HCL 500 MG PO TABS: 500.0000 mg | ORAL_TABLET | Freq: Two times a day (BID) | ORAL | Status: DC

## 2013-10-05 NOTE — Patient Instructions (Signed)
Probiotic daily such as Digestive Advantage daily Increase hydration and consider Cranberry tabs daily  Urinary Tract Infection Urinary tract infections (UTIs) can develop anywhere along your urinary tract. Your urinary tract is your body's drainage system for removing wastes and extra water. Your urinary tract includes two kidneys, two ureters, a bladder, and a urethra. Your kidneys are a pair of bean-shaped organs. Each kidney is about the size of your fist. They are located below your ribs, one on each side of your spine. CAUSES Infections are caused by microbes, which are microscopic organisms, including fungi, viruses, and bacteria. These organisms are so small that they can only be seen through a microscope. Bacteria are the microbes that most commonly cause UTIs. SYMPTOMS  Symptoms of UTIs may vary by age and gender of the patient and by the location of the infection. Symptoms in young women typically include a frequent and intense urge to urinate and a painful, burning feeling in the bladder or urethra during urination. Older women and men are more likely to be tired, shaky, and weak and have muscle aches and abdominal pain. A fever may mean the infection is in your kidneys. Other symptoms of a kidney infection include pain in your back or sides below the ribs, nausea, and vomiting. DIAGNOSIS To diagnose a UTI, your caregiver will ask you about your symptoms. Your caregiver also will ask to provide a urine sample. The urine sample will be tested for bacteria and white blood cells. White blood cells are made by your body to help fight infection. TREATMENT  Typically, UTIs can be treated with medication. Because most UTIs are caused by a bacterial infection, they usually can be treated with the use of antibiotics. The choice of antibiotic and length of treatment depend on your symptoms and the type of bacteria causing your infection. HOME CARE INSTRUCTIONS  If you were prescribed antibiotics,  take them exactly as your caregiver instructs you. Finish the medication even if you feel better after you have only taken some of the medication.  Drink enough water and fluids to keep your urine clear or pale yellow.  Avoid caffeine, tea, and carbonated beverages. They tend to irritate your bladder.  Empty your bladder often. Avoid holding urine for long periods of time.  Empty your bladder before and after sexual intercourse.  After a bowel movement, women should cleanse from front to back. Use each tissue only once. SEEK MEDICAL CARE IF:   You have back pain.  You develop a fever.  Your symptoms do not begin to resolve within 3 days. SEEK IMMEDIATE MEDICAL CARE IF:   You have severe back pain or lower abdominal pain.  You develop chills.  You have nausea or vomiting.  You have continued burning or discomfort with urination. MAKE SURE YOU:   Understand these instructions.  Will watch your condition.  Will get help right away if you are not doing well or get worse. Document Released: 04/27/2005 Document Revised: 01/17/2012 Document Reviewed: 08/26/2011 [facility] Patient Information 2014 [place].

## 2013-10-05 NOTE — Assessment & Plan Note (Addendum)
Started on Ciprofloxacin, probiotics and cranberry tabs, increase hydration. Urine sent for culture

## 2013-10-05 NOTE — Progress Notes (Signed)
Patient ID: Tyler [name], male   DOB: 1955-09-18, 58 y.o.   MRN: [id number] NEVADA MULLETT [id number] 1956/03/08 10/05/2013      Progress Note-Follow Up  Subjective  Chief Complaint  Chief Complaint  Patient presents with  . Urinary Frequency    X 1 wk. No other sx's noticed    HPI  Patient is a 58 year old Caucasian male who is in today complaining of about one week's worth of increased urinary frequency with some mild discomfort intermittently. There is some urgency as well but he denies any abdominal or back pain. Denies fevers, chills, chest pain, palpitations or shortness of breath. Denies any trauma or recent illness.  Past Medical History  Diagnosis Date  . Colon polyps   . Hemorrhoids   . GERD (gastroesophageal reflux disease)   . Anxiety   . Asthma   . Viral warts, unspecified   . Hyperlipidemia   . Rosacea     No past surgical history on file.  Family History  Problem Relation Age of Onset  . Coronary artery disease    . Stomach cancer Maternal Uncle     History   Social History  . Marital Status: Married    Spouse Name: N/A    Number of Children: N/A  . Years of Education: N/A   Occupational History  . Not on file.   Social History Main Topics  . Smoking status: Never Smoker   . Smokeless tobacco: Not on file  . Alcohol Use: No  . Drug Use: No  . Sexual Activity: Not on file   Other Topics Concern  . Not on file   Social History Narrative   Regular exercise-uses caffeine    Current Outpatient Prescriptions on File Prior to Visit  Medication Sig Dispense Refill  . ALPRAZolam (XANAX) 0.25 MG tablet Take 1 tablet (0.25 mg total) by mouth 3 (three) times daily as needed.  90 tablet  1  . aspirin 81 MG EC tablet Take 81 mg by mouth daily.        . bisoprolol (ZEBETA) 5 MG tablet TAKE 1/2 TABLET BY MOUTH DAILY  15 tablet  11  . glycopyrrolate (ROBINUL) 2 MG tablet TAKE 1 TABLET BY MOUTH TWICE DAILY AS NEEDED  60 tablet  3  .  hydrocortisone-pramoxine (ANALPRAM-HC) 2.5-1 % rectal cream Place rectally 3 (three) times daily.  30 g  6  . mesalamine (CANASA) 1000 MG suppository Place 1 suppository (1,000 mg total) rectally 2 (two) times daily.  30 suppository  0  . Methylcellulose, Laxative, (CITRUCEL) 500 MG TABS Take 2 tablets by mouth 2 (two) times daily.       . minocycline (MINOCIN,DYNACIN) 100 MG capsule Take 100 mg by mouth daily as needed.        . multivitamin (THERAGRAN) per tablet Take 1 tablet by mouth daily.        . Omega-3 Fatty Acids (FISH OIL) 1000 MG CAPS Take 2 capsules by mouth daily.        . pimecrolimus (ELIDEL) 1 % cream Apply topically 2 (two) times daily as needed.  30 g  3  . rosuvastatin (CRESTOR) 20 MG tablet Take 20 mg by mouth once a week.        . fexofenadine (ALLEGRA) 180 MG tablet Take 1 tablet (180 mg total) by mouth daily.       No current facility-administered medications on file prior to visit.    Allergies  Allergen Reactions  . Penicillins  Review of Systems  Review of Systems  Constitutional: Negative for fever and malaise/fatigue.  HENT: Negative for congestion.   Eyes: Negative for discharge.  Respiratory: Negative for shortness of breath.   Cardiovascular: Negative for chest pain, palpitations and leg swelling.  Gastrointestinal: Negative for nausea, abdominal pain and diarrhea.  Genitourinary: Positive for urgency and frequency. Negative for dysuria.  Musculoskeletal: Negative for falls.  Skin: Negative for rash.  Neurological: Negative for loss of consciousness and headaches.  Endo/Heme/Allergies: Negative for polydipsia.  Psychiatric/Behavioral: Negative for depression and suicidal ideas. The patient is not nervous/anxious and does not have insomnia.     Objective  BP 130/80  Pulse 91  Temp(Src) 97.1 F (36.2 C) (Oral)  Ht 5\' 7"  (1.702 m)  Wt 157 lb 8 oz (71.442 kg)  BMI 24.66 kg/m2  SpO2 98%  Physical Exam  Physical Exam  Constitutional: He is  oriented to person, place, and time and well-developed, well-nourished, and in no distress. No distress.  HENT:  Head: Normocephalic and atraumatic.  Eyes: Conjunctivae are normal.  Neck: Neck supple. No thyromegaly present.  Cardiovascular: Normal rate, regular rhythm and normal heart sounds.   No murmur heard. Pulmonary/Chest: Effort normal and breath sounds normal. No respiratory distress.  Abdominal: He exhibits no distension and no mass. There is no tenderness.  Musculoskeletal: He exhibits no edema.  Neurological: He is alert and oriented to person, place, and time.  Skin: Skin is warm.  Psychiatric: Memory, affect and judgment normal.    Lab Results  Component Value Date   TSH 1.43 04/15/2013   Lab Results  Component Value Date   WBC 6.5 04/15/2013   HGB 16.1 04/15/2013   HCT 47.9 04/15/2013   MCV 88.2 04/15/2013   PLT 242.0 04/15/2013   Lab Results  Component Value Date   CREATININE 1.0 04/15/2013   BUN 20 04/15/2013   NA 138 04/15/2013   K 4.2 04/15/2013   CL 108 04/15/2013   CO2 26 04/15/2013   Lab Results  Component Value Date   ALT 14 04/15/2013   AST 18 04/15/2013   ALKPHOS 40 04/15/2013   BILITOT 0.9 04/15/2013   Lab Results  Component Value Date   CHOL 117 04/15/2013   Lab Results  Component Value Date   HDL 43.50 04/15/2013   Lab Results  Component Value Date   LDLCALC 62 04/15/2013   Lab Results  Component Value Date   TRIG 59.0 04/15/2013   Lab Results  Component Value Date   CHOLHDL 3 04/15/2013     Assessment & Plan  Hematuria Started on Ciprofloxacin, probiotics and cranberry tabs, increase hydration. Urine sent for culture

## 2013-10-05 NOTE — Progress Notes (Signed)
Pre-visit discussion using our clinic review tool. No additional management support is needed unless otherwise documented below in the visit note.  

## 2013-10-07 LAB — CULTURE, URINE COMPREHENSIVE
Colony Count: NO GROWTH
Organism ID, Bacteria: NO GROWTH

## 2013-10-23 ENCOUNTER — Ambulatory Visit: Payer: Self-pay | Admitting: Internal Medicine

## 2013-10-30 ENCOUNTER — Encounter: Payer: Self-pay | Admitting: Internal Medicine

## 2013-10-30 DIAGNOSIS — N419 Inflammatory disease of prostate, unspecified: Secondary | ICD-10-CM

## 2013-11-04 MED ORDER — CIPROFLOXACIN HCL 500 MG PO TABS
500.0000 mg | ORAL_TABLET | Freq: Two times a day (BID) | ORAL | Status: DC
Start: 2013-11-04 — End: 2013-11-08

## 2013-11-08 ENCOUNTER — Encounter: Payer: Self-pay | Admitting: Family

## 2013-11-08 ENCOUNTER — Telehealth: Payer: Self-pay | Admitting: Internal Medicine

## 2013-11-08 ENCOUNTER — Ambulatory Visit (INDEPENDENT_AMBULATORY_CARE_PROVIDER_SITE_OTHER): Payer: 59 | Admitting: Family

## 2013-11-08 VITALS — BP 138/90 | HR 89 | Temp 98.6°F | Wt 161.0 lb

## 2013-11-08 DIAGNOSIS — R350 Frequency of micturition: Secondary | ICD-10-CM

## 2013-11-08 DIAGNOSIS — N410 Acute prostatitis: Secondary | ICD-10-CM

## 2013-11-08 MED ORDER — CIPROFLOXACIN HCL 500 MG PO TABS: 500.0000 mg | ORAL_TABLET | Freq: Two times a day (BID) | ORAL | Status: DC

## 2013-11-08 NOTE — Telephone Encounter (Signed)
[facility] Triage Call Report Triage Record Num: [id number] Operator: Benita Stabile Patient Name: Tyler [name] Call Date & Time: 11/07/2013 6:11:51PM Patient Phone: [phone] PCP: Benay Pillow Patient Gender: Male PCP Fax : [phone] Patient DOB: 1956-07-13 Practice Name: [facility] Reason for Call: Caller: Nixon/Patient; PCP: Benay Pillow (Adults only); CB#: [phone]; Call regarding Urinary discomfort. Ongoing x 6 week. Treated with Cipro x 7 days beginning 3/15 . Emailed last week not better after no meds x weeks. Pt was out of town until 11/07/13. Urgent Care seen out of town, Bactrim not helping either since 11/03/13. Afebrile. Pain in penis. Emergent symptoms r/o per Urinary Symptoms Protocol. See within 24hours. Care advice given. No available "acute" slot for tomorrow 4/10. Pt will f/u with office in am. Protocol(s) Used: Urinary Symptoms - Male Recommended Outcome per Protocol: See Provider within 24 hours Reason for Outcome: Evaluated by provider AND symptoms worsening after following recommended treatment plan for 72 hours Care Advice: ~ Go to the ED IMMEDIATELY if repeated vomiting or severe pain occurs. ~ Call provider if you develop flank or low back pain, fever, chills, or generally feel sick. ~ SYMPTOM / CONDITION MANAGEMENT Limit carbonated, alcoholic, and caffeinated beverages such as coffee, tea and soda. Avoid nonprescription cold and allergy medications that contain caffeine. Limit intake of tomatoes, fruit juices (except for unsweetened cranberry juice), dairy products, spicy foods, sugar, and artificial sweeteners (aspartame or saccharine). Stop or decrease smoking. Reducing exposure to bladder irritants may help lessen urgency. ~ Analgesic/Antipyretic Advice - Acetaminophen: Consider acetaminophen as directed on label or by pharmacist/provider for pain or fever. PRECAUTIONS: - Use if there is no history of liver disease, alcoholism, or intake of  three or more alcohol drinks per day. - Do not exceed recommended dose or frequency. ~ ~ Call provider if urine is pink, red, smoky or cola colored. Systemic Inflammatory Response Syndrome (SIRS): Watch for signs of a generalized, whole body infection. Occurs within days of a localized infection, especially of the urinary, GI, respiratory or nervous systems; or after a traumatic injury or invasive procedure. - Call EMS 911 if symptoms have worsened, such as increasing confusion or unusual drowsiness; cold and clammy skin; no urine output; rapid respiration (>30/min.) or slow respiration (<10/min.); struggling to breathe. - Go to the ED immediately for early symptoms of rapid pulse >90/min. or rapid breathing >20/min. at rest; chills; oral temperature >100.4 F (38 C) or <96.8 F (36 C) when associated with conditions noted. ~ Increase intake of fluids. Try to drink 8 oz. (.2 liter) every hour when awake, unless on restricted fluids for other medical reasons. Include at least two 8 oz. (.2 liter) glasses of unsweetened cranberry juice each day. Take sips of fluid or eat ice chips if nauseated or vomiting. ~ 11/07/2013 6:35:54PM Page 1 of 1 [email or web address]

## 2013-11-08 NOTE — Patient Instructions (Signed)
Prostatitis The prostate gland is about the size and shape of a walnut. It is located just below your bladder. It produces one of the components of semen, which is made up of sperm and the fluids that help nourish and transport it out from the testicles. Prostatitis is inflammation of the prostate gland.  There are four types of prostatitis:  Acute bacterial prostatitis This is the least common type of prostatitis. It starts quickly and usually is associated with a bladder infection, high fever, and shaking chills. It can occur at any age.  Chronic bacterial prostatitis This is a persistent bacterial infection in the prostate. It usually develops from repeated acute bacterial prostatitis or acute bacterial prostatitis that was not properly treated. It can occur in men of any age but is most common in middle-aged men whose prostate has begun to enlarge. The symptoms are not as severe as those in acute bacterial prostatitis. Discomfort in the part of your body that is in front of your rectum and below your scrotum (perineum), lower abdomen, or in the head of your penis (glans) may represent your primary discomfort.  Chronic prostatitis (nonbacterial) This is the most common type of prostatitis. It is inflammation of the prostate gland that is not caused by a bacterial infection. The cause is unknown and may be associated with a viral infection or autoimmune disorder.  Prostatodynia (pelvic floor disorder) This is associated with increased muscular tone in the pelvis surrounding the prostate. CAUSES The causes of bacterial prostatitis are bacterial infection. The causes of the other types of prostatitis are unknown.  SYMPTOMS  Symptoms can vary depending upon the type of prostatitis that exists. There can also be overlap in symptoms. Possible symptoms for each type of prostatitis are listed below. Acute Bacterial Prostatitis  Painful urination.  Fever or chills.  Muscle or joint pains.  Low  back pain.  Low abdominal pain.  Inability to empty bladder completely. Chronic Bacterial Prostatitis, Chronic Nonbacterial Prostatitis, and Prostatodynia  Sudden urge to urinate.  Frequent urination.  Difficulty starting urine stream.  Weak urine stream.  Discharge from the urethra.  Dribbling after urination.  Rectal pain.  Pain in the testicles, penis, or tip of the penis.  Pain in the perineum.  Problems with sexual function.  Painful ejaculation.  Bloody semen. DIAGNOSIS  In order to diagnose prostatitis, your health care provider will ask about your symptoms. One or more urine samples will be taken and tested (urinalysis). If the urinalysis result is negative for bacteria, your health care provider may use a finger to feel your prostate (digital rectal exam). This exam helps your health care provider determine if your prostate is swollen and tender. It will also produce a specimen of semen that can be analyzed. TREATMENT  Treatment for prostatitis depends on the cause. If a bacterial infection is the cause, it can be treated with antibiotic medicine. In cases of chronic bacterial prostatitis, the use of antibiotics for up to 1 month or 6 weeks may be necessary. Your health care provider may instruct you to take sitz baths to help relieve pain. A sitz bath is a bath of hot water in which your hips and buttocks are under water. This relaxes the pelvic floor muscles and often helps to relieve the pressure on your prostate. HOME CARE INSTRUCTIONS   Take all medicines as directed by your health care provider.  Take sitz baths as directed by your health care provider. SEEK MEDICAL CARE IF:   Your symptoms   get worse, not better.  You have a fever. SEEK IMMEDIATE MEDICAL CARE IF:   You have chills.  You feel nauseous or vomit.  You feel lightheaded or faint.  You are unable to urinate.  You have blood or blood clots in your urine. Document Released: 07/15/2000  Document Revised: 05/08/2013 Document Reviewed: 02/04/2013 Exit[employer] Patient Information 2014 [employer].  

## 2013-11-08 NOTE — Progress Notes (Signed)
Pre visit review using our clinic review tool, if applicable. No additional management support is needed unless otherwise documented below in the visit note. 

## 2013-11-10 LAB — URINE CULTURE

## 2013-11-11 NOTE — Progress Notes (Signed)
Subjective:    Patient ID: Tyler [name], male    DOB: 06-Mar-1956, 58 y.o.   MRN: [id number]  HPI 58 year old white male, nonsmoker is in today with c/o urinary frequency x 1 week and worsening. Reports being put on Bactrim twice a day x 10 days that helped. Feels like he was getting better until the medication ran out. No has increased frequency. No burning.    Review of Systems  Constitutional: Negative.   Respiratory: Negative.   Cardiovascular: Negative.   Gastrointestinal: Negative.   Genitourinary: Negative for dysuria.  Musculoskeletal: Negative.   Skin: Negative.   Allergic/Immunologic: Negative.   Psychiatric/Behavioral: Negative.    Past Medical History  Diagnosis Date  . Colon polyps   . Hemorrhoids   . GERD (gastroesophageal reflux disease)   . Anxiety   . Asthma   . Viral warts, unspecified   . Hyperlipidemia   . Rosacea   . Hematuria 10/05/2013    History   Social History  . Marital Status: Married    Spouse Name: N/A    Number of Children: N/A  . Years of Education: N/A   Occupational History  . Not on file.   Social History Main Topics  . Smoking status: Never Smoker   . Smokeless tobacco: Not on file  . Alcohol Use: No  . Drug Use: No  . Sexual Activity: Not on file   Other Topics Concern  . Not on file   Social History Narrative   Regular exercise-uses caffeine    History reviewed. No pertinent past surgical history.  Family History  Problem Relation Age of Onset  . Coronary artery disease    . Stomach cancer Maternal Uncle     Allergies  Allergen Reactions  . Penicillins     Current Outpatient Prescriptions on File Prior to Visit  Medication Sig Dispense Refill  . ALPRAZolam (XANAX) 0.25 MG tablet Take 1 tablet (0.25 mg total) by mouth 3 (three) times daily as needed.  90 tablet  1  . aspirin 81 MG EC tablet Take 81 mg by mouth daily.        . bisoprolol (ZEBETA) 5 MG tablet TAKE 1/2 TABLET BY MOUTH DAILY  15 tablet  11    . glycopyrrolate (ROBINUL) 2 MG tablet TAKE 1 TABLET BY MOUTH TWICE DAILY AS NEEDED  60 tablet  3  . hydrocortisone-pramoxine (ANALPRAM-HC) 2.5-1 % rectal cream Place rectally 3 (three) times daily.  30 g  6  . mesalamine (CANASA) 1000 MG suppository Place 1 suppository (1,000 mg total) rectally 2 (two) times daily.  30 suppository  0  . Methylcellulose, Laxative, (CITRUCEL) 500 MG TABS Take 2 tablets by mouth 2 (two) times daily.       . minocycline (MINOCIN,DYNACIN) 100 MG capsule Take 100 mg by mouth daily as needed.        . multivitamin (THERAGRAN) per tablet Take 1 tablet by mouth daily.        . Omega-3 Fatty Acids (FISH OIL) 1000 MG CAPS Take 2 capsules by mouth daily.        . pimecrolimus (ELIDEL) 1 % cream Apply topically 2 (two) times daily as needed.  30 g  3  . rosuvastatin (CRESTOR) 20 MG tablet Take 20 mg by mouth once a week.        . fexofenadine (ALLEGRA) 180 MG tablet Take 1 tablet (180 mg total) by mouth daily.       No current facility-administered  medications on file prior to visit.    BP 138/90  Pulse 89  Temp(Src) 98.6 F (37 C) (Oral)  Wt 161 lb (73.029 kg)chart    Objective:   Physical Exam  Constitutional: He is oriented to person, place, and time. He appears well-developed and well-nourished.  Neck: Normal range of motion. Neck supple.  Cardiovascular: Normal rate, regular rhythm and normal heart sounds.   Pulmonary/Chest: Effort normal and breath sounds normal.  Abdominal: Soft. Bowel sounds are normal.  Musculoskeletal: Normal range of motion.  Neurological: He is alert and oriented to person, place, and time.  Skin: Skin is warm and dry.  Psychiatric: He has a normal mood and affect.          Assessment & Plan:  Tyler [name] was seen today for urinary tract infection.  Diagnoses and associated orders for this visit:  Prostatitis, acute  Urinary frequency - Urine culture  Other Orders - ciprofloxacin (CIPRO) 500 MG tablet; Take 1 tablet (500  mg total) by mouth 2 (two) times daily.   Call the office if symptoms worsen or persist. Recheck as scheduled and as needed.

## 2013-12-03 ENCOUNTER — Encounter: Payer: Self-pay | Admitting: Internal Medicine

## 2013-12-04 ENCOUNTER — Other Ambulatory Visit: Payer: Self-pay | Admitting: Family

## 2013-12-04 DIAGNOSIS — N411 Chronic prostatitis: Secondary | ICD-10-CM

## 2013-12-06 ENCOUNTER — Telehealth: Payer: Self-pay | Admitting: Internal Medicine

## 2013-12-06 NOTE — Telephone Encounter (Signed)
Pt prefers to see Dr Gaynelle Arabian [facility].  Referral has been sent. Any way to get that info to them prior to scheduling appt?

## 2013-12-18 ENCOUNTER — Ambulatory Visit: Payer: Self-pay | Admitting: Internal Medicine

## 2014-01-05 ENCOUNTER — Other Ambulatory Visit: Payer: Self-pay | Admitting: Internal Medicine

## 2014-07-01 ENCOUNTER — Other Ambulatory Visit: Payer: Self-pay

## 2014-07-01 MED ORDER — BISOPROLOL FUMARATE 5 MG PO TABS: ORAL_TABLET | ORAL | Status: DC

## 2014-07-01 NOTE — Telephone Encounter (Signed)
Rx request for Bisoprolol fumarate 5 mg tablet- Take 1/2 tablet by mouth daily. Rx sent to pharmacy.

## 2014-07-17 ENCOUNTER — Other Ambulatory Visit: Payer: Self-pay | Admitting: Family Medicine

## 2014-09-19 ENCOUNTER — Ambulatory Visit (INDEPENDENT_AMBULATORY_CARE_PROVIDER_SITE_OTHER): Payer: 59 | Admitting: Sports Medicine

## 2014-09-19 ENCOUNTER — Encounter: Payer: Self-pay | Admitting: Sports Medicine

## 2014-09-19 VITALS — BP 155/93 | HR 91 | Ht 67.0 in | Wt 155.0 lb

## 2014-09-19 DIAGNOSIS — M7741 Metatarsalgia, right foot: Secondary | ICD-10-CM

## 2014-09-19 DIAGNOSIS — M7742 Metatarsalgia, left foot: Secondary | ICD-10-CM

## 2014-09-19 DIAGNOSIS — R269 Unspecified abnormalities of gait and mobility: Secondary | ICD-10-CM

## 2014-09-22 NOTE — Progress Notes (Signed)
  Tyler [name] - 59 y.o. male MRN [id number]  Date of birth: November 16, 1955  SUBJECTIVE: CC: bilateral foot pain, initial evaluation new patient,referred by Dr. Noemi Chapel HPI: patient reports long-standing bilateral foot pain that has worsened acutely over the past 2 months. No eliciting injury  Seen by Dr. Noemi Chapel who recommended evaluation for potential orthotics  Taking meloxicam with mild improvement  No prior surgeries on his feet.  No orthotics in the past.  ROS: Denies any significant swelling or known injury.  HISTORY:  Past Medical, Surgical, Social, and Family History reviewed & updated per EMR.  Pertinent Historical Findings include:  reports that he has never smoked. He does not have any smokeless tobacco history on file. History of anxiety, hyperlipidemia, GERD, asthma. Followed by Dr. Risa Grill, urology Primary prevention with Crestor Office manager at [facility] associates day.  OBJECTIVE:  VS:   HT:5\' 7"  (170.2 cm)   WT:155 lb (70.308 kg)  BMI:24.3          BP:(!) 155/93 mmHg  HR:91bpm  TEMP: ( )  RESP:   PHYSICAL EXAM:  GENERAL: adult Caucasian male. No acute distress PSYCH: Alert and appropriately interactive. VASCULAR: bilateral DP and PT pulses 2+/4, no pretibial edema NEURO: Lower extremity strength is 5+/5 in all myotomes; sensation is intact to light touch in all dermatomes. BILATERAL FEET: no significant deformity, Morton's foot with splaying of the first and second toes, bilateral broad-based Morton's callus. incurling of fourth and fifth toes. Overall early fatiguing posterior tibialis recruitment GAIT: Excessive pronation  ASSESSMENT: 1. Metatarsalgia of both feet   2. Abnormality of gait     Problem  Metatarsalgia of Both Feet  Abnormality of Gait    PLAN: See problem based charting & AVS for additional documentation.  Sports insoles with small scaphoid pads placed with improvement in gait  Continue Meloxicam PRN  HEP: Alfredson with in-toed  Alfredson > No Follow-up on file.

## 2014-09-26 DIAGNOSIS — M7741 Metatarsalgia, right foot: Secondary | ICD-10-CM | POA: Insufficient documentation

## 2014-09-26 DIAGNOSIS — R269 Unspecified abnormalities of gait and mobility: Secondary | ICD-10-CM | POA: Insufficient documentation

## 2014-09-26 DIAGNOSIS — M7742 Metatarsalgia, left foot: Principal | ICD-10-CM

## 2014-10-21 ENCOUNTER — Ambulatory Visit (INDEPENDENT_AMBULATORY_CARE_PROVIDER_SITE_OTHER): Payer: 59 | Admitting: Sports Medicine

## 2014-10-21 ENCOUNTER — Encounter: Payer: Self-pay | Admitting: Sports Medicine

## 2014-10-21 VITALS — BP 152/84 | Ht 67.0 in | Wt 155.0 lb

## 2014-10-21 DIAGNOSIS — M7741 Metatarsalgia, right foot: Secondary | ICD-10-CM | POA: Diagnosis not present

## 2014-10-21 DIAGNOSIS — R269 Unspecified abnormalities of gait and mobility: Secondary | ICD-10-CM

## 2014-10-21 DIAGNOSIS — M7742 Metatarsalgia, left foot: Secondary | ICD-10-CM

## 2014-10-21 DIAGNOSIS — M722 Plantar fascial fibromatosis: Secondary | ICD-10-CM | POA: Diagnosis not present

## 2014-10-22 ENCOUNTER — Encounter: Payer: Self-pay | Admitting: Sports Medicine

## 2014-10-22 DIAGNOSIS — M722 Plantar fascial fibromatosis: Secondary | ICD-10-CM | POA: Insufficient documentation

## 2014-10-22 NOTE — Progress Notes (Signed)
  REGIE BUNNER - 59 y.o. male MRN [id number]  Date of birth: 11-Dec-1955  SUBJECTIVE:  Including CC & ROS.  Patient is a 59 yo male present for follow up of bilateral foot pain left greater than right.  Patient was last seen in late Feb and diagnsosed with gait abnormalities with pes plantus, pronation, and 1st and 2nd ray splaying, and morton's callus. Patient was placed in sports insoles with arch support, 1st ray post. He was instructed to continue ice, stretching, alfredson heel raises, and mobic. Patient reports some improvement with these changes but no resolution of pain. He found the insoles to be comfortable and has continued ice and some stretches. He has a 10k race coming up that he want to be ready for soon. The left foot has mostly pain in the medial arch and the right foot is mostly improved.    ROS: Review of systems otherwise negative except for information present in HPI  HISTORY: Past Medical, Surgical, Social, and Family History Reviewed & Updated per EMR. Pertinent Historical Findings include: reports that he has never smoked. History of anxiety, hyperlipidemia, GERD, asthma. Followed by Dr. Risa Grill, urology Office manager at [facility] associates day  PHYSICAL EXAM:  VS: BP:(!) 152/84 mmHg  HR: bpm  TEMP: ( )  RESP:   HT:5\' 7"  (170.2 cm)   WT:155 lb (70.308 kg)  BMI:24.3. FEET/ GAIT EXAM:  General: well nourished Skin of LE: warm; dry, no rashes, lesions, ecchymosis or erythema. Vascular: Dorsal pedal pulses 2+ bilaterally Neurologically: Sensation to light touch lower extremities equal and intact bilaterally.  Observation - no ecchymosis, no edema, or hematoma present  Palpation: mild TTP over left medial PF origin  Normal ankle motion and strength bilaterally  Extension/flexion 5/5 strength bilaterally in toes Weight-bearing foot exam:  First ray: splaying with 2nd toe  Second ray: morton's foot. Morton's callus Longitudinal arch: pes plantus Heel position:  neutral Leg Length discrepancy: symmetric  Gait analysis:  Striking location: midfoot Foot motion: pronation   ASSESSMENT & PLAN: See problem based charting & AVS for pt instructions. Impression: Abnormalities of gait, mild left plantar fasciitis Recommended: custom orthotics  Orthotic Fitting and Adjustment note: Patient was fitted for a : standard, cushioned, semi-rigid orthotic.  The orthotic was heated and afterward the patient stood on the orthotic blank positioned on the orthotic stand.  The patient was positioned in subtalar neutral position and 10 degrees of ankle dorsiflexion in a weight bearing stance.  After completion of molding, a stable base was applied to the orthotic blank.  The blank was ground to a stable position for weight bearing.  Size: 8 red EVA Base: Blue EVA Posting: 1st ray Additional orthotic padding: medial wedge bilaterally to further decrease pronation  Greater than 50% of the patient's visit for a total of 30 minutes, was spent conducting face-to-face counseling for bilateral foot orthotics fitting and constructing

## 2014-10-28 ENCOUNTER — Other Ambulatory Visit: Payer: Self-pay

## 2014-10-28 MED ORDER — GLYCOPYRROLATE 2 MG PO TABS: 2.0000 mg | ORAL_TABLET | Freq: Two times a day (BID) | ORAL | Status: DC | PRN

## 2014-10-28 NOTE — Telephone Encounter (Signed)
Rx request for Glycopyrrolate 2 mg tablets- Take 1 tablet by mouth twice daily as needed #60  Pharm:  [facility]

## 2014-11-21 ENCOUNTER — Encounter: Payer: Self-pay | Admitting: Family Medicine

## 2014-11-21 ENCOUNTER — Ambulatory Visit (INDEPENDENT_AMBULATORY_CARE_PROVIDER_SITE_OTHER): Payer: 59 | Admitting: Family Medicine

## 2014-11-21 VITALS — BP 140/88 | HR 82 | Temp 98.3°F | Wt 163.0 lb

## 2014-11-21 DIAGNOSIS — I10 Essential (primary) hypertension: Secondary | ICD-10-CM | POA: Diagnosis not present

## 2014-11-21 DIAGNOSIS — F411 Generalized anxiety disorder: Secondary | ICD-10-CM | POA: Diagnosis not present

## 2014-11-21 DIAGNOSIS — Z7251 High risk heterosexual behavior: Secondary | ICD-10-CM | POA: Diagnosis not present

## 2014-11-21 DIAGNOSIS — E785 Hyperlipidemia, unspecified: Secondary | ICD-10-CM

## 2014-11-21 DIAGNOSIS — N410 Acute prostatitis: Secondary | ICD-10-CM

## 2014-11-21 DIAGNOSIS — J309 Allergic rhinitis, unspecified: Secondary | ICD-10-CM | POA: Insufficient documentation

## 2014-11-21 DIAGNOSIS — Z87891 Personal history of nicotine dependence: Secondary | ICD-10-CM | POA: Insufficient documentation

## 2014-11-21 DIAGNOSIS — N400 Enlarged prostate without lower urinary tract symptoms: Secondary | ICD-10-CM

## 2014-11-21 LAB — COMPREHENSIVE METABOLIC PANEL
ALBUMIN: 4.4 g/dL (ref 3.5–5.2)
ALT: 13 U/L (ref 0–53)
AST: 16 U/L (ref 0–37)
Alkaline Phosphatase: 44 U/L (ref 39–117)
BUN: 25 mg/dL — AB (ref 6–23)
CHLORIDE: 108 meq/L (ref 96–112)
CO2: 26 mEq/L (ref 19–32)
Calcium: 10.1 mg/dL (ref 8.4–10.5)
Creatinine, Ser: 1.14 mg/dL (ref 0.40–1.50)
GFR: 69.67 mL/min (ref >60.00)
Glucose, Bld: 102 mg/dL — ABNORMAL HIGH (ref 70–99)
POTASSIUM: 4.3 meq/L (ref 3.5–5.1)
Sodium: 142 mEq/L (ref 135–145)
TOTAL PROTEIN: 7.1 g/dL (ref 6.0–8.3)
Total Bilirubin: 0.5 mg/dL (ref 0.2–1.2)

## 2014-11-21 LAB — LIPID PANEL
Cholesterol: 147 mg/dL (ref 0–200)
HDL: 44.3 mg/dL (ref >39.00)
LDL CALC: 84 mg/dL (ref 0–99)
NonHDL: 102.7
TRIGLYCERIDES: 95 mg/dL (ref 0.0–149.0)
Total CHOL/HDL Ratio: 3
VLDL: 19 mg/dL (ref 0.0–40.0)

## 2014-11-21 LAB — TSH: TSH: 1.78 u[IU]/mL (ref 0.35–4.50)

## 2014-11-21 LAB — CBC
HEMATOCRIT: 48 % (ref 39.0–52.0)
Hemoglobin: 16.3 g/dL (ref 13.0–17.0)
MCHC: 34 g/dL (ref 30.0–36.0)
MCV: 87.3 fl (ref 78.0–100.0)
Platelets: 281 10*3/uL (ref 150.0–400.0)
RBC: 5.5 Mil/uL (ref 4.22–5.81)
RDW: 13.9 % (ref 11.5–15.5)
WBC: 6.8 10*3/uL (ref 4.0–10.5)

## 2014-11-21 LAB — PSA: PSA: 1.7 ng/mL (ref 0.10–4.00)

## 2014-11-21 NOTE — Progress Notes (Signed)
Tyler Reddish, MD Phone: [phone]  Subjective:  Patient presents today to establish care with me as their new primary care provider. Patient was formerly a patient of Dr. Arnoldo Morale. Chief complaint-noted.   Hypertension-mild poor control in office, likely white coat element, well controlled at home  BP Readings from Last 3 Encounters:  11/21/14 140/88  10/21/14 152/84  09/19/14 155/93   Home BP monitoring-typically 120s/130s over 70s Compliant with medications-yes without side effects ROS-Denies any CP, HA, SOB, blurry vision, LE edema.   Hyperlipidemia-controlled previously and today  Lab Results  Component Value Date   LDLCALC 84 11/21/2014   On statin: crestor once a week Regular exercise: yes, regular biker and rusn some as well Diet: reasonable ROS- no chest pain or shortness of breath. No myalgias  BPH- mild poor control -last year, Dr. Risa Grill stated noninfectious prostatitis and mild BPH. flomax prn. Was on antibiotics for a month prior to that visit. Patient with nocturia about once a night but if in worse period uses flomax mainly prn. History hematuria intermittently as well had been told due to riding bikes a lot.  ROS- no dysuria, daytime polyuria  The following were reviewed and entered/updated in epic: Past Medical History  Diagnosis Date  . Colon polyps   . Hemorrhoids   . GERD (gastroesophageal reflux disease)   . Anxiety   . Asthma   . Hyperlipidemia   . Rosacea   . Hematuria 10/05/2013   Patient Active Problem List   Diagnosis Date Noted  . Essential hypertension 11/21/2014    Priority: Medium  . BPH (benign prostatic hyperplasia) 11/21/2014    Priority: Medium  . Irritable bowel syndrome 04/17/2008    Priority: Medium  . Anxiety state 12/27/2007    Priority: Medium  . Hyperlipidemia 10/26/2007    Priority: Medium  . Allergic rhinitis 11/21/2014    Priority: Low  . Former smoker 11/21/2014    Priority: Low  . Plantar fasciitis of left foot  10/22/2014    Priority: Low  . Metatarsalgia of both feet 09/26/2014    Priority: Low  . Hematuria 10/05/2013    Priority: Low  . External hemorrhoids 04/17/2008    Priority: Low  . History of colonic polyps 04/16/2008    Priority: Low  . Asthma 12/27/2007    Priority: Low  . GERD 12/27/2007    Priority: Low  . Rosacea 10/26/2007    Priority: Low   Past Surgical History  Procedure Laterality Date  . None      Family History  Problem Relation Age of Onset  . Coronary artery disease Father 8    death 84, presumed due to MI as sudden  . Stomach cancer Maternal Uncle   . Glaucoma Mother     and father  . Coronary artery disease Mother     late 75s-cabg    Medications- reviewed and updated Current Outpatient Prescriptions  Medication Sig Dispense Refill  . ALPRAZolam (XANAX) 0.25 MG tablet Take 1 tablet (0.25 mg total) by mouth 3 (three) times daily as needed. 90 tablet 1  . aspirin 81 MG EC tablet Take 81 mg by mouth daily.      . bisoprolol (ZEBETA) 5 MG tablet TAKE ONE HALF TABLET BY MOUTH DAILY. 15 tablet 5  . ciprofloxacin (CIPRO) 500 MG tablet Take 1 tablet (500 mg total) by mouth 2 (two) times daily. 60 tablet 0  . CRESTOR 20 MG tablet TAKE AS DIRECTED 15 tablet 2  . fexofenadine (ALLEGRA) 180 MG  tablet Take 1 tablet (180 mg total) by mouth daily.    Marland Kitchen glycopyrrolate (ROBINUL) 2 MG tablet Take 1 tablet (2 mg total) by mouth 2 (two) times daily as needed. 60 tablet 3  . hydrocortisone-pramoxine (ANALPRAM-HC) 2.5-1 % rectal cream Place rectally 3 (three) times daily. 30 g 6  . meloxicam (MOBIC) 15 MG tablet Take 15 mg by mouth daily.    . mesalamine (CANASA) 1000 MG suppository Place 1 suppository (1,000 mg total) rectally 2 (two) times daily. 30 suppository 0  . Methylcellulose, Laxative, (CITRUCEL) 500 MG TABS Take 2 tablets by mouth 2 (two) times daily.     . minocycline (MINOCIN,DYNACIN) 100 MG capsule Take 100 mg by mouth daily as needed.      . multivitamin  (THERAGRAN) per tablet Take 1 tablet by mouth daily.      . Omega-3 Fatty Acids (FISH OIL) 1000 MG CAPS Take 2 capsules by mouth daily.      . pimecrolimus (ELIDEL) 1 % cream Apply topically 2 (two) times daily as needed. 30 g 3  . rosuvastatin (CRESTOR) 20 MG tablet Take 20 mg by mouth once a week.      . tamsulosin (FLOMAX) 0.4 MG CAPS capsule   6   No current facility-administered medications for this visit.    Allergies-reviewed and updated Allergies  Allergen Reactions  . Penicillins     History   Social History  . Marital Status: Married    Spouse Name: N/A  . Number of Children: N/A  . Years of Education: N/A   Social History Main Topics  . Smoking status: Former Smoker -- 0.75 packs/day for 4 years    Types: Cigarettes  . Smokeless tobacco: Not on file  . Alcohol Use: 0.0 - 1.8 oz/week    0-3 Standard drinks or equivalent per week     Comment: social  . Drug Use: No  . Sexual Activity: Not on file   Other Topics Concern  . None   Social History Narrative   Married (wife Network engineer at [place], see mother in Sports coach as well [place]), 3 kids. 30, 27,25 in 2016-2 come to [place] as well. No grandkids.       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       Hobbies: biking, cycling, reading, movies    ROS--See HPI   Objective: BP 140/88 mmHg  Pulse 82  Temp(Src) 98.3 F (36.8 C)  Wt 163 lb (73.936 kg) Gen: NAD, resting comfortably, wears glasses HEENT: Mucous membranes are moist.  Neck: no thyromegaly CV: RRR no murmurs rubs or gallops Lungs: CTAB no crackles, wheeze, rhonchi Abdomen: soft/nontender/nondistended/normal bowel sounds.  Ext: no edema Skin: warm, dry, no rash Neuro: grossly normal, moves all extremities   Assessment/Plan:  Essential hypertension Well controlled on home readings. White coat element here. Continue bisoprolol 5mg  alone with SBP just at 140.    Hyperlipidemia Continue aspirin and statin  given history where Father died at 39 with potentially MI. Crestor 20mg  once weekly keeping LDL at 84.    BPH (benign prostatic hyperplasia) Intermittent issues primarily nocturia improved when he takes flomax. Likely more effective if daily dosing but patient wants to continue prn dosing. Encouraged patient to keep urology appointment and inquire any concern about intermittent hematuria- prostate related?, from bike riding per what patient has been told prior? PSA is up today from 2014 and may be reasonable to check again at urology visit.    Schedule  CPE 6-12 months   Fasting with HIV pending Results for orders placed or performed in visit on 11/21/14 (from the past 24 hour(s))  Lipid panel     Status: None   Collection Time: 11/21/14  9:14 AM  Result Value Ref Range   Cholesterol 147 0 - 200 mg/dL   Triglycerides 95.0 0.0 - 149.0 mg/dL   HDL 44.30 >39.00 mg/dL   VLDL 19.0 0.0 - 40.0 mg/dL   LDL Cholesterol 84 0 - 99 mg/dL   Total CHOL/HDL Ratio 3    NonHDL 102.70   TSH     Status: None   Collection Time: 11/21/14  9:14 AM  Result Value Ref Range   TSH 1.78 0.35 - 4.50 uIU/mL  CBC     Status: None   Collection Time: 11/21/14  9:14 AM  Result Value Ref Range   WBC 6.8 4.0 - 10.5 K/uL   RBC 5.50 4.22 - 5.81 Mil/uL   Platelets 281.0 150.0 - 400.0 K/uL   Hemoglobin 16.3 13.0 - 17.0 g/dL   HCT 48.0 39.0 - 52.0 %   MCV 87.3 78.0 - 100.0 fl   MCHC 34.0 30.0 - 36.0 g/dL   RDW 13.9 11.5 - 15.5 %  Comprehensive metabolic panel     Status: Abnormal   Collection Time: 11/21/14  9:14 AM  Result Value Ref Range   Sodium 142 135 - 145 mEq/L   Potassium 4.3 3.5 - 5.1 mEq/L   Chloride 108 96 - 112 mEq/L   CO2 26 19 - 32 mEq/L   Glucose, Bld 102 (H) 70 - 99 mg/dL   BUN 25 (H) 6 - 23 mg/dL   Creatinine, Ser 1.14 0.40 - 1.50 mg/dL   Total Bilirubin 0.5 0.2 - 1.2 mg/dL   Alkaline Phosphatase 44 39 - 117 U/L   AST 16 0 - 37 U/L   ALT 13 0 - 53 U/L   Total Protein 7.1 6.0 - 8.3 g/dL    Albumin 4.4 3.5 - 5.2 g/dL   Calcium 10.1 8.4 - 10.5 mg/dL   GFR 69.67 >60.00 mL/min  PSA     Status: None   Collection Time: 11/21/14  9:14 AM  Result Value Ref Range   PSA 1.70 0.10 - 4.00 ng/mL

## 2014-11-21 NOTE — Assessment & Plan Note (Signed)
Well controlled on home readings. White coat element here. Continue bisoprolol 5mg  alone with SBP just at 140.

## 2014-11-21 NOTE — Patient Instructions (Addendum)
Update labs today, send mychart message with results.   BP hair high in office, looks good at home, if regularly >140/90 then see [place] back  Schedule a physical for 6-12 months

## 2014-11-21 NOTE — Assessment & Plan Note (Signed)
Continue aspirin and statin given history where Father died at 54 with potentially MI. Crestor 20mg  once weekly keeping LDL at 84.

## 2014-11-21 NOTE — Assessment & Plan Note (Addendum)
Intermittent issues primarily nocturia improved when he takes flomax. Likely more effective if daily dosing but patient wants to continue prn dosing. Encouraged patient to keep urology appointment and inquire any concern about intermittent hematuria- prostate related?, from bike riding per what patient has been told prior? PSA is up today from 2014 but within normal rate or rise for BPH and may be reasonable to check again at urology visit vs. 1 year here.

## 2014-11-22 LAB — HIV ANTIBODY (ROUTINE TESTING W REFLEX): HIV 1&2 Ab, 4th Generation: NONREACTIVE

## 2015-02-02 ENCOUNTER — Other Ambulatory Visit: Payer: Self-pay | Admitting: Family Medicine

## 2015-06-19 ENCOUNTER — Other Ambulatory Visit: Payer: Self-pay | Admitting: Family Medicine

## 2015-07-22 ENCOUNTER — Other Ambulatory Visit: Payer: Self-pay | Admitting: Family Medicine

## 2015-07-22 NOTE — Telephone Encounter (Signed)
Sent to the pharmacy by e-scribe.  Pt to return in April 17 for CPX

## 2015-09-29 ENCOUNTER — Telehealth: Payer: Self-pay

## 2015-09-29 MED ORDER — ATORVASTATIN CALCIUM 40 MG PO TABS: 40.0000 mg | ORAL_TABLET | ORAL | Status: DC

## 2015-09-29 NOTE — Telephone Encounter (Signed)
Medication sent in, lm on pt vm tcb.

## 2015-09-29 NOTE — Telephone Encounter (Signed)
Please provide rx for atorvastatin 40mg . Same directoins as crestor. Please inform patient this is due to insurance reasons

## 2015-09-29 NOTE — Addendum Note (Signed)
Addended by: Clyde Lundborg A on: 09/29/2015 05:07 PM   Modules accepted: Orders

## 2015-09-29 NOTE — Telephone Encounter (Signed)
See below

## 2015-09-29 NOTE — Telephone Encounter (Signed)
Patients insurance denied coverage for crestor. This was denied due to not meeting conditions. Patient must have a history of trying or failing to all of the following medications.  Atorvastatin Lovastatin  Pravastatin  Simvastatin   The information that was provided shows that the patient has not tried these medications.

## 2015-10-01 ENCOUNTER — Encounter: Payer: Self-pay | Admitting: Family Medicine

## 2015-10-13 ENCOUNTER — Encounter: Payer: Self-pay | Admitting: Family Medicine

## 2015-11-26 ENCOUNTER — Encounter: Payer: Self-pay | Admitting: Family Medicine

## 2015-12-01 ENCOUNTER — Other Ambulatory Visit: Payer: Self-pay | Admitting: Family Medicine

## 2015-12-03 ENCOUNTER — Telehealth: Payer: Self-pay | Admitting: Family Medicine

## 2015-12-03 NOTE — Telephone Encounter (Signed)
Pt has been scheduled within 30 days for a follow up on meds.

## 2015-12-03 NOTE — Telephone Encounter (Signed)
Pt following up on refill request for  bisoprolol (ZEBETA) 5 MG tablet Pt has one tab left and needs today. Going out of town in the am.  However, pt received message he needs a cpe, and made one on mychart.  But it was not a cpe slot.  Pt needs early appointment, first available cpe is 03/10/16. Can pt get 90 day refill to cover until appointment, or do I need to work him in sooner and create a slot? Thanks.  [facility]/[address]

## 2015-12-03 NOTE — Telephone Encounter (Signed)
See below

## 2015-12-03 NOTE — Telephone Encounter (Signed)
Per Dr. Yong Channel 47min appointment when pt is available and schedule his CPE in August. A 30 day supply will be given until he comes in for the follow up visit as he was supposed to schedule his 6 month follow up when he was here last April.

## 2015-12-25 ENCOUNTER — Ambulatory Visit (INDEPENDENT_AMBULATORY_CARE_PROVIDER_SITE_OTHER): Payer: 59 | Admitting: Family Medicine

## 2015-12-25 ENCOUNTER — Encounter: Payer: Self-pay | Admitting: Family Medicine

## 2015-12-25 VITALS — BP 126/80 | Wt 153.0 lb

## 2015-12-25 DIAGNOSIS — I10 Essential (primary) hypertension: Secondary | ICD-10-CM

## 2015-12-25 DIAGNOSIS — N400 Enlarged prostate without lower urinary tract symptoms: Secondary | ICD-10-CM

## 2015-12-25 DIAGNOSIS — Z1211 Encounter for screening for malignant neoplasm of colon: Secondary | ICD-10-CM

## 2015-12-25 DIAGNOSIS — L719 Rosacea, unspecified: Secondary | ICD-10-CM

## 2015-12-25 DIAGNOSIS — E785 Hyperlipidemia, unspecified: Secondary | ICD-10-CM | POA: Diagnosis not present

## 2015-12-25 DIAGNOSIS — K582 Mixed irritable bowel syndrome: Secondary | ICD-10-CM

## 2015-12-25 MED ORDER — ROSUVASTATIN CALCIUM 20 MG PO TABS
20.0000 mg | ORAL_TABLET | ORAL | Status: DC
Start: 2015-12-25 — End: 2016-03-10

## 2015-12-25 MED ORDER — PIMECROLIMUS 1 % EX CREA: TOPICAL_CREAM | Freq: Two times a day (BID) | CUTANEOUS | Status: DC | PRN

## 2015-12-25 MED ORDER — HYDROCORTISONE ACE-PRAMOXINE 2.5-1 % RE CREA: 1.0000 "application " | TOPICAL_CREAM | Freq: Two times a day (BID) | RECTAL | Status: DC | PRN

## 2015-12-25 MED ORDER — BISOPROLOL FUMARATE 5 MG PO TABS
2.5000 mg | ORAL_TABLET | Freq: Every day | ORAL | Status: DC
Start: 2015-12-25 — End: 2016-03-10

## 2015-12-25 NOTE — Assessment & Plan Note (Signed)
Roscacea noted on face- refill elidel

## 2015-12-25 NOTE — Assessment & Plan Note (Signed)
S: suspect controlled on rosuvastatin once a week (previously atorvastatin 40mg  once a week). No myalgias. Father died at 25 potentially from MI Lab Results  Component Value Date   CHOL 147 11/21/2014   HDL 44.30 11/21/2014   LDLCALC 84 11/21/2014   LDLDIRECT 152.5 11/18/2008   TRIG 95.0 11/21/2014   CHOLHDL 3 11/21/2014   A/P: update lipids at CPE but suspect controlled

## 2015-12-25 NOTE — Progress Notes (Signed)
Subjective:  Tyler [name] is a 60 y.o. year old very pleasant male patient who presents for/with See problem oriented charting ROS- No chest pain or shortness of breath. No headache or blurry vision.see any ROS included in HPI as well.   Past Medical History-  Patient Active Problem List   Diagnosis Date Noted  . Essential hypertension 11/21/2014    Priority: Medium  . BPH (benign prostatic hyperplasia) 11/21/2014    Priority: Medium  . Irritable bowel syndrome 04/17/2008    Priority: Medium  . Anxiety state 12/27/2007    Priority: Medium  . Hyperlipidemia 10/26/2007    Priority: Medium  . Allergic rhinitis 11/21/2014    Priority: Low  . Former smoker 11/21/2014    Priority: Low  . Plantar fasciitis of left foot 10/22/2014    Priority: Low  . Metatarsalgia of both feet 09/26/2014    Priority: Low  . Hematuria 10/05/2013    Priority: Low  . External hemorrhoids 04/17/2008    Priority: Low  . History of colonic polyps 04/16/2008    Priority: Low  . Asthma 12/27/2007    Priority: Low  . GERD 12/27/2007    Priority: Low  . Rosacea 10/26/2007    Priority: Low    Medications- reviewed and updated Current Outpatient Prescriptions  Medication Sig Dispense Refill  . ALPRAZolam (XANAX) 0.25 MG tablet Take 1 tablet (0.25 mg total) by mouth 3 (three) times daily as needed. 90 tablet 1  . aspirin 81 MG EC tablet Take 81 mg by mouth daily.      . bisoprolol (ZEBETA) 5 MG tablet Take 0.5 tablets (2.5 mg total) by mouth daily. 45 tablet 3  . glycopyrrolate (ROBINUL) 2 MG tablet Take 1 tablet (2 mg total) by mouth 2 (two) times daily as needed. 60 tablet 3  . Methylcellulose, Laxative, (CITRUCEL) 500 MG TABS Take 2 tablets by mouth 2 (two) times daily.     . multivitamin (THERAGRAN) per tablet Take 1 tablet by mouth daily.      . Omega-3 Fatty Acids (FISH OIL) 1000 MG CAPS Take 2 capsules by mouth daily.      . pimecrolimus (ELIDEL) 1 % cream Apply topically 2 (two) times daily  as needed. 30 g 3  . rosuvastatin (CRESTOR) 20 MG tablet Take 1 tablet (20 mg total) by mouth once a week. 15 tablet 3  . hydrocortisone-pramoxine (ANALPRAM HC) 2.5-1 % rectal cream Place 1 application rectally 2 (two) times daily as needed for hemorrhoids or itching. 30 g 0   No current facility-administered medications for this visit.    Objective: BP 126/80 mmHg  Wt 153 lb (69.4 kg) HR 80 on exam Gen: NAD, resting comfortably CV: RRR no murmurs rubs or gallops Lungs: CTAB no crackles, wheeze, rhonchi Abdomen: soft/nontender/nondistended/normal bowel sounds. No rebound or guarding.  Ext: no edema Skin: warm, dry Neuro: grossly normal, moves all extremities  Assessment/Plan:  Essential hypertension S: controlled. On bisoprolol 2.5mg  . Home #s 129/84 most recently BP Readings from Last 3 Encounters:  12/25/15 126/80  11/21/14 140/88  10/21/14 152/84  A/P:Continue current meds:  Doing very well, best BP in office- new position at work- less stress?  Hyperlipidemia S: suspect controlled on rosuvastatin once a week (previously atorvastatin 40mg  once a week). No myalgias. Father died at 61 potentially from MI Lab Results  Component Value Date   CHOL 147 11/21/2014   HDL 44.30 11/21/2014   LDLCALC 84 11/21/2014   LDLDIRECT 152.5 11/18/2008   TRIG  95.0 11/21/2014   CHOLHDL 3 11/21/2014   A/P: update lipids at CPE but suspect controlled  BPH (benign prostatic hyperplasia) S: uses flomax on prn dosage basi previouslys. Intermittent hematuria- advised urology follow up. He saw urology and urine was normal- no further follow up. They were not concerned by PSA trend at that time. Not taking flomax anymore. History of noninfectious prostatitis- may have been reason for more temporary issues Lab Results  Component Value Date   PSA 1.70 11/21/2014   PSA 1.09 04/15/2013   PSA 1.02 01/26/2012  A/P: plan is to update PSA at physical- glad not having to take flomax anymore.   Irritable  bowel syndrome S: 2-3 days without bowel movement and then hard when he goes, then goes the opposite way. Taking fiber twice a day, stool softener once a day . Gets some rectal irritation from the diarrhea and analpram has worked in past A/P: continues with mixed picture IBS. dont really want to stop stool softener. Will just refill analpram - also has intermittent hemorrhoids  Rosacea Roscacea noted on face- refill elidel   August CPE. Return precautions advised.   Orders Placed This Encounter  Procedures  . Ambulatory referral to Gastroenterology    Referral Priority:  Routine    Referral Type:  Consultation    Referral Reason:  Specialty Services Required    Number of Visits Requested:  1  after 07/27/2016- 10 year follow up  Meds ordered this encounter  Medications  . pimecrolimus (ELIDEL) 1 % cream    Sig: Apply topically 2 (two) times daily as needed.    Dispense:  30 g    Refill:  3  . hydrocortisone-pramoxine (ANALPRAM HC) 2.5-1 % rectal cream    Sig: Place 1 application rectally 2 (two) times daily as needed for hemorrhoids or itching.    Dispense:  30 g    Refill:  0  . bisoprolol (ZEBETA) 5 MG tablet    Sig: Take 0.5 tablets (2.5 mg total) by mouth daily.    Dispense:  45 tablet    Refill:  3  . rosuvastatin (CRESTOR) 20 MG tablet    Sig: Take 1 tablet (20 mg total) by mouth once a week.    Dispense:  15 tablet    Refill:  3    Garret Reddish, MD

## 2015-12-25 NOTE — Patient Instructions (Addendum)
We will call you within a week about your referral to GI- make sure they schedule you December 27th or later. If you do not hear within 2 weeks, give [place] a call.   Refilled analpram, elidel, bisoprolol, crestor  Things look great- full labs and physical in august  Health Maintenance Due  Topic Date Due  . Hepatitis C Screening  [birth year]  . ZOSTAVAX  01/04/2015   Call your insurace about the shingles shot to see if it is covered or how much it would cost and where is cheaper (here or pharmacy).  If you want to receive here, call for nurse visit or do at physical

## 2015-12-25 NOTE — Assessment & Plan Note (Signed)
S: 2-3 days without bowel movement and then hard when he goes, then goes the opposite way. Taking fiber twice a day, stool softener once a day . Gets some rectal irritation from the diarrhea and analpram has worked in past A/P: continues with mixed picture IBS. dont really want to stop stool softener. Will just refill analpram - also has intermittent hemorrhoids

## 2015-12-25 NOTE — Assessment & Plan Note (Signed)
S: uses flomax on prn dosage basi previouslys. Intermittent hematuria- advised urology follow up. He saw urology and urine was normal- no further follow up. They were not concerned by PSA trend at that time. Not taking flomax anymore. History of noninfectious prostatitis- may have been reason for more temporary issues Lab Results  Component Value Date   PSA 1.70 11/21/2014   PSA 1.09 04/15/2013   PSA 1.02 01/26/2012  A/P: plan is to update PSA at physical- glad not having to take flomax anymore.

## 2015-12-25 NOTE — Assessment & Plan Note (Signed)
S: controlled. On bisoprolol 2.5mg  . Home #s 129/84 most recently BP Readings from Last 3 Encounters:  12/25/15 126/80  11/21/14 140/88  10/21/14 152/84  A/P:Continue current meds:  Doing very well, best BP in office- new position at work- less stress?

## 2015-12-29 MED ORDER — BRIMONIDINE TARTRATE 0.33 % EX GEL: CUTANEOUS | Status: DC

## 2015-12-29 NOTE — Telephone Encounter (Signed)
See note

## 2015-12-30 ENCOUNTER — Other Ambulatory Visit: Payer: Self-pay | Admitting: Family Medicine

## 2016-01-07 ENCOUNTER — Ambulatory Visit: Payer: Self-pay | Admitting: Family Medicine

## 2016-02-08 ENCOUNTER — Telehealth: Payer: 59 | Admitting: Family

## 2016-02-08 DIAGNOSIS — J329 Chronic sinusitis, unspecified: Secondary | ICD-10-CM

## 2016-02-08 DIAGNOSIS — B9789 Other viral agents as the cause of diseases classified elsewhere: Secondary | ICD-10-CM

## 2016-02-08 DIAGNOSIS — B349 Viral infection, unspecified: Secondary | ICD-10-CM

## 2016-02-08 MED ORDER — FLUTICASONE PROPIONATE 50 MCG/ACT NA SUSP: 2.0000 | Freq: Every day | NASAL | Status: DC

## 2016-02-08 NOTE — Progress Notes (Signed)

## 2016-03-02 ENCOUNTER — Encounter: Payer: Self-pay | Admitting: Gastroenterology

## 2016-03-03 ENCOUNTER — Other Ambulatory Visit (INDEPENDENT_AMBULATORY_CARE_PROVIDER_SITE_OTHER): Payer: 59

## 2016-03-03 DIAGNOSIS — Z0000 Encounter for general adult medical examination without abnormal findings: Secondary | ICD-10-CM

## 2016-03-03 LAB — CBC WITH DIFFERENTIAL/PLATELET
BASOS ABS: 0 10*3/uL (ref 0.0–0.1)
Basophils Relative: 0.3 % (ref 0.0–3.0)
EOS ABS: 0.1 10*3/uL (ref 0.0–0.7)
Eosinophils Relative: 1.6 % (ref 0.0–5.0)
HCT: 49 % (ref 39.0–52.0)
Hemoglobin: 16.6 g/dL (ref 13.0–17.0)
LYMPHS ABS: 2.3 10*3/uL (ref 0.7–4.0)
Lymphocytes Relative: 34.9 % (ref 12.0–46.0)
MCHC: 33.8 g/dL (ref 30.0–36.0)
MCV: 86.6 fl (ref 78.0–100.0)
MONO ABS: 0.6 10*3/uL (ref 0.1–1.0)
MONOS PCT: 8.6 % (ref 3.0–12.0)
NEUTROS ABS: 3.6 10*3/uL (ref 1.4–7.7)
NEUTROS PCT: 54.6 % (ref 43.0–77.0)
PLATELETS: 276 10*3/uL (ref 150.0–400.0)
RBC: 5.66 Mil/uL (ref 4.22–5.81)
RDW: 14.1 % (ref 11.5–15.5)
WBC: 6.7 10*3/uL (ref 4.0–10.5)

## 2016-03-03 LAB — PSA: PSA: 1.68 ng/mL (ref 0.10–4.00)

## 2016-03-03 LAB — LIPID PANEL
Cholesterol: 125 mg/dL (ref 0–200)
HDL: 46.1 mg/dL (ref >39.00)
LDL CALC: 61 mg/dL (ref 0–99)
NONHDL: 78.88
Total CHOL/HDL Ratio: 3
Triglycerides: 88 mg/dL (ref 0.0–149.0)
VLDL: 17.6 mg/dL (ref 0.0–40.0)

## 2016-03-03 LAB — POC URINALSYSI DIPSTICK (AUTOMATED)
BILIRUBIN UA: NEGATIVE
GLUCOSE UA: NEGATIVE
KETONES UA: NEGATIVE
Leukocytes, UA: NEGATIVE
NITRITE UA: NEGATIVE
PH UA: 6
Protein, UA: NEGATIVE
Spec Grav, UA: 1.015
Urobilinogen, UA: 0.2

## 2016-03-03 LAB — BASIC METABOLIC PANEL
BUN: 19 mg/dL (ref 6–23)
CALCIUM: 10 mg/dL (ref 8.4–10.5)
CO2: 24 mEq/L (ref 19–32)
CREATININE: 1.06 mg/dL (ref 0.40–1.50)
Chloride: 106 mEq/L (ref 96–112)
GFR: 75.45 mL/min (ref >60.00)
GLUCOSE: 99 mg/dL (ref 70–99)
Potassium: 4.1 mEq/L (ref 3.5–5.1)
SODIUM: 139 meq/L (ref 135–145)

## 2016-03-03 LAB — TSH: TSH: 2.28 u[IU]/mL (ref 0.35–4.50)

## 2016-03-03 LAB — HEPATIC FUNCTION PANEL
ALK PHOS: 44 U/L (ref 39–117)
ALT: 14 U/L (ref 0–53)
AST: 14 U/L (ref 0–37)
Albumin: 4.3 g/dL (ref 3.5–5.2)
BILIRUBIN DIRECT: 0.1 mg/dL (ref 0.0–0.3)
BILIRUBIN TOTAL: 0.6 mg/dL (ref 0.2–1.2)
Total Protein: 6.7 g/dL (ref 6.0–8.3)

## 2016-03-04 LAB — HEPATITIS C ANTIBODY: HCV Ab: NEGATIVE

## 2016-03-10 ENCOUNTER — Ambulatory Visit (INDEPENDENT_AMBULATORY_CARE_PROVIDER_SITE_OTHER): Payer: 59 | Admitting: Family Medicine

## 2016-03-10 ENCOUNTER — Encounter: Payer: Self-pay | Admitting: Family Medicine

## 2016-03-10 VITALS — BP 140/86 | HR 86 | Temp 97.8°F | Ht 67.5 in | Wt 152.2 lb

## 2016-03-10 DIAGNOSIS — R319 Hematuria, unspecified: Secondary | ICD-10-CM | POA: Diagnosis not present

## 2016-03-10 DIAGNOSIS — Z0000 Encounter for general adult medical examination without abnormal findings: Secondary | ICD-10-CM | POA: Diagnosis not present

## 2016-03-10 MED ORDER — ROSUVASTATIN CALCIUM 20 MG PO TABS: 20.0000 mg | ORAL_TABLET | ORAL | 3 refills | Status: DC

## 2016-03-10 MED ORDER — ALPRAZOLAM 0.25 MG PO TABS: 0.2500 mg | ORAL_TABLET | Freq: Three times a day (TID) | ORAL | 1 refills | Status: DC | PRN

## 2016-03-10 NOTE — Progress Notes (Signed)
Phone: [phone]  Subjective:  Patient presents today for their annual physical. Chief complaint-noted.   See problem oriented charting- ROS- full  review of systems was completed and negative including No chest pain or shortness of breath. No headache or blurry vision. Even with his cycling- no issues  The following were reviewed and entered/updated in epic: Past Medical History:  Diagnosis Date  . Anxiety   . Asthma   . Colon polyps   . GERD (gastroesophageal reflux disease)   . Hematuria 10/05/2013  . Hemorrhoids   . Hyperlipidemia   . Rosacea    Patient Active Problem List   Diagnosis Date Noted  . Essential hypertension 11/21/2014    Priority: Medium  . BPH (benign prostatic hyperplasia) 11/21/2014    Priority: Medium  . Irritable bowel syndrome 04/17/2008    Priority: Medium  . Anxiety state 12/27/2007    Priority: Medium  . Hyperlipidemia 10/26/2007    Priority: Medium  . Allergic rhinitis 11/21/2014    Priority: Low  . Former smoker 11/21/2014    Priority: Low  . Plantar fasciitis of left foot 10/22/2014    Priority: Low  . Metatarsalgia of both feet 09/26/2014    Priority: Low  . Hematuria 10/05/2013    Priority: Low  . External hemorrhoids 04/17/2008    Priority: Low  . History of colonic polyps 04/16/2008    Priority: Low  . Asthma 12/27/2007    Priority: Low  . GERD 12/27/2007    Priority: Low  . Rosacea 10/26/2007    Priority: Low   Past Surgical History:  Procedure Laterality Date  . none      Family History  Problem Relation Age of Onset  . Coronary artery disease Father 35    death 60, presumed due to MI as sudden  . Stomach cancer Maternal Uncle   . Glaucoma Mother     and father  . Coronary artery disease Mother     late 41s-cabg    Medications- reviewed and updated Current Outpatient Prescriptions  Medication Sig Dispense Refill  . ALPRAZolam (XANAX) 0.25 MG tablet Take 1 tablet (0.25 mg total) by mouth 3 (three) times  daily as needed. 90 tablet 1  . aspirin 81 MG EC tablet Take 81 mg by mouth daily.      . bisoprolol (ZEBETA) 5 MG tablet TAKE 1/2 TABLET BY MOUTH DAILY 15 tablet 5  . Brimonidine Tartrate 0.33 % GEL Apply a pea-size amount once daily as a thin layer across the entire face  for rosacea 30 g 5  . fluticasone (FLONASE) 50 MCG/ACT nasal spray Place 2 sprays into both nostrils daily. 16 g 6  . glycopyrrolate (ROBINUL) 2 MG tablet Take 1 tablet (2 mg total) by mouth 2 (two) times daily as needed. 60 tablet 3  . hydrocortisone-pramoxine (ANALPRAM HC) 2.5-1 % rectal cream Place 1 application rectally 2 (two) times daily as needed for hemorrhoids or itching. 30 g 0  . Methylcellulose, Laxative, (CITRUCEL) 500 MG TABS Take 2 tablets by mouth 2 (two) times daily.     . multivitamin (THERAGRAN) per tablet Take 1 tablet by mouth daily.      . Omega-3 Fatty Acids (FISH OIL) 1000 MG CAPS Take 2 capsules by mouth daily.      . rosuvastatin (CRESTOR) 20 MG tablet Take 1 tablet (20 mg total) by mouth once a week. 14 tablet 3   No current facility-administered medications for this visit.     Allergies-reviewed and updated Allergies  Allergen Reactions  . Penicillins     Social History   Social History  . Marital status: Married    Spouse name: N/A  . Number of children: N/A  . Years of education: N/A   Social History Main Topics  . Smoking status: Former Smoker    Packs/day: 0.75    Years: 4.00    Types: Cigarettes  . Smokeless tobacco: None  . Alcohol use 0.0 - 1.8 oz/week     Comment: social  . Drug use: No  . Sexual activity: Not Asked   Other Topics Concern  . None   Social History Narrative   Married (wife Network engineer at [employer], see mother in Sports coach as well [place]), 3 kids. 30, 27,25 in 2016-2 come to [place] as well. No grandkids.       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 Ran optical department  prior.       Hobbies: biking, cycling, reading, movies    Objective: BP 140/86 (BP Location: Left Arm, Patient Position: Sitting, Cuff Size: Normal)   Pulse 86   Temp 97.8 F (36.6 C) (Oral)   Ht 5' 7.5" (1.715 m)   Wt 152 lb 3.2 oz (69 kg)   SpO2 99%   BMI 23.49 kg/m  Gen: NAD, resting comfortably HEENT: Mucous membranes are moist. Oropharynx normal Neck: no thyromegaly CV: RRR no murmurs rubs or gallops Lungs: CTAB no crackles, wheeze, rhonchi Abdomen: soft/nontender/nondistended/normal bowel sounds. No rebound or guarding.  Ext: no edema Skin: warm, dry Neuro: grossly normal, moves all extremities, PERRLA  Rectal declined until next year  Assessment/Plan:  60 y.o. male presenting for annual physical.  Health Maintenance counseling: 1. Anticipatory guidance: Patient counseled regarding regular dental exams, eye exams, wearing seatbelts.  2. Risk factor reduction:  Advised patient of need for regular exercise and diet rich and fruits and vegetables to reduce risk of heart attack and stroke. Still up to 30 miles of bikin, treadmill most other days.  3. Immunizations/screenings/ancillary studies Immunization History  Administered Date(s) Administered  . Influenza,inj,Quad PF,36+ Mos 04/22/2013  . Td 10/26/2007   Health Maintenance Due  Topic Date Due  . ZOSTAVAX - check insurance,  01/04/2015  . INFLUENZA VACCINE - will send mychart message when he gets this 03/01/2016   4. Prostate cancer screening- low risk based on psa trend and rectal exam at urology- declined as just done 6 months ago  Lab Results  Component Value Date   PSA 1.68 03/03/2016   PSA 1.70 11/21/2014   PSA 1.09 04/15/2013   5. Colon cancer screening - 2007 - needs repeat at this point, has scheduled already 6. Skin cancer screening- no dermatologist= upper body exam without obvious worrisome lesions- discussed returning to derm still an option  Status of chronic or acute concerns  IBS- stable on  probiotic and robinul 2mg  BID prn. Mixed picture  HLD- crestor 20mg  weekly- LDL excllent under 70  HTN- controlled on bisoprolol at home, white coat element in office- often elevated in office but controlled at home BP Readings from Last 3 Encounters:  03/10/16 140/86  12/25/15 126/80  11/21/14 140/88   Anxiety- sparing xanax for plane flights and doctor visits  1 year CPE  Orders Placed This Encounter  Procedures  . Urine Microscopic Only    Meds ordered this encounter  Medications  . ALPRAZolam (XANAX) 0.25 MG tablet    Sig: Take 1 tablet (0.25 mg  total) by mouth 3 (three) times daily as needed.    Dispense:  90 tablet    Refill:  1  . rosuvastatin (CRESTOR) 20 MG tablet    Sig: Take 1 tablet (20 mg total) by mouth once a week.    Dispense:  14 tablet    Refill:  3    Return precautions advised.   Garret Reddish, MD

## 2016-03-10 NOTE — Patient Instructions (Addendum)
Repeat urine before you leave  No other changes- diabetes risk is lower, cholesterol looks great, blood pressure up as always in office- keep checking at home with goal <140/90

## 2016-03-10 NOTE — Progress Notes (Signed)
Pre visit review using our clinic review tool, if applicable. No additional management support is needed unless otherwise documented below in the visit note. 

## 2016-03-18 LAB — URINALYSIS, MICROSCOPIC ONLY: RBC / HPF: NONE SEEN (ref 0–?)

## 2016-03-18 NOTE — Addendum Note (Signed)
Addended by: Elmer Picker on: 03/18/2016 08:26 AM   Modules accepted: Orders

## 2016-05-12 ENCOUNTER — Ambulatory Visit (AMBULATORY_SURGERY_CENTER): Payer: Self-pay | Admitting: *Deleted

## 2016-05-12 VITALS — Ht 67.0 in | Wt 152.0 lb

## 2016-05-12 DIAGNOSIS — Z1211 Encounter for screening for malignant neoplasm of colon: Secondary | ICD-10-CM

## 2016-05-12 MED ORDER — NA SULFATE-K SULFATE-MG SULF 17.5-3.13-1.6 GM/177ML PO SOLN: 1.0000 | Freq: Once | ORAL | 0 refills | Status: AC

## 2016-05-12 NOTE — Progress Notes (Signed)
No egg or soy allergy known to patient   issues with past sedation with any surgeries  or procedures- last colon 2007 pt had hiccups for 2 days after and had to take meds for this to get them stop stop -, never had  intubation  No diet pills per patient No home 02 use per patient  No blood thinners per patient  Pt states issues with constipation - uses fiber daily and stool softener otc - pt has IBS  No A fib or A flutter

## 2016-05-13 ENCOUNTER — Encounter: Payer: Self-pay | Admitting: Gastroenterology

## 2016-05-27 ENCOUNTER — Ambulatory Visit (AMBULATORY_SURGERY_CENTER): Payer: 59 | Admitting: Gastroenterology

## 2016-05-27 ENCOUNTER — Encounter: Payer: Self-pay | Admitting: Gastroenterology

## 2016-05-27 VITALS — BP 104/66 | HR 70 | Temp 98.0°F | Resp 13 | Ht 67.0 in | Wt 152.0 lb

## 2016-05-27 DIAGNOSIS — Z1212 Encounter for screening for malignant neoplasm of rectum: Secondary | ICD-10-CM

## 2016-05-27 DIAGNOSIS — Z1211 Encounter for screening for malignant neoplasm of colon: Secondary | ICD-10-CM

## 2016-05-27 MED ORDER — SODIUM CHLORIDE 0.9 % IV SOLN: 500.0000 mL | INTRAVENOUS | Status: DC

## 2016-05-27 NOTE — Patient Instructions (Signed)
YOU HAD AN ENDOSCOPIC PROCEDURE TODAY AT [facility]:   Refer to the procedure report that was given to you for any specific questions about what was found during the examination.  If the procedure report does not answer your questions, please call your gastroenterologist to clarify.  If you requested that your care partner not be given the details of your procedure findings, then the procedure report has been included in a sealed envelope for you to review at your convenience later.  YOU SHOULD EXPECT: Some feelings of bloating in the abdomen. Passage of more gas than usual.  Walking can help get rid of the air that was put into your GI tract during the procedure and reduce the bloating. If you had a lower endoscopy (such as a colonoscopy or flexible sigmoidoscopy) you may notice spotting of blood in your stool or on the toilet paper. If you underwent a bowel prep for your procedure, you may not have a normal bowel movement for a few days.  Please Note:  You might notice some irritation and congestion in your nose or some drainage.  This is from the oxygen used during your procedure.  There is no need for concern and it should clear up in a day or so.  SYMPTOMS TO REPORT IMMEDIATELY:   Following lower endoscopy (colonoscopy or flexible sigmoidoscopy):  Excessive amounts of blood in the stool  Significant tenderness or worsening of abdominal pains  Swelling of the abdomen that is new, acute  Fever of 100F or higher   For urgent or emergent issues, a gastroenterologist can be reached at any hour by calling [phone].   DIET:  We do recommend a small meal at first, but then you may proceed to your regular diet.  Drink plenty of fluids but you should avoid alcoholic beverages for 24 hours.  ACTIVITY:  You should plan to take it easy for the rest of today and you should NOT DRIVE or use heavy machinery until tomorrow (because of the sedation medicines used during the test).     FOLLOW UP: Our staff will call the number listed on your records the next business day following your procedure to check on you and address any questions or concerns that you may have regarding the information given to you following your procedure. If we do not reach you, we will leave a message.  However, if you are feeling well and you are not experiencing any problems, there is no need to return our call.  We will assume that you have returned to your regular daily activities without incident.  If any biopsies were taken you will be contacted by phone or by letter within the next 1-3 weeks.  Please call us at [phone] if you have not heard about the biopsies in 3 weeks.    SIGNATURES/CONFIDENTIALITY: You and/or your care partner have signed paperwork which will be entered into your electronic medical record.  These signatures attest to the fact that that the information above on your After Visit Summary has been reviewed and is understood.  Full responsibility of the confidentiality of this discharge information lies with you and/or your care-partner.  Hemorrhoids-handout given  Repeat colonoscopy in 10 years 2027.

## 2016-05-27 NOTE — Progress Notes (Signed)
Report to PACU, RN, vss, BBS= Clear.  

## 2016-05-27 NOTE — Op Note (Signed)
[facility] Patient Name: Tyler [name] Procedure Date: 05/27/2016 10:18 AM MRN: [id number] Endoscopist: Ladene Artist , MD Age: 60 Referring MD:  Date of Birth: May 29, 1956 Gender: Male Account #: [id number] Procedure:                Colonoscopy Indications:              Screening for colorectal malignant neoplasm, Last                            colonoscopy 10 years ago Medicines:                Monitored Anesthesia Care Procedure:                Pre-Anesthesia Assessment:                           - Prior to the procedure, a History and Physical                            was performed, and patient medications and                            allergies were reviewed. The patient's tolerance of                            previous anesthesia was also reviewed. The risks                            and benefits of the procedure and the sedation                            options and risks were discussed with the patient.                            All questions were answered, and informed consent                            was obtained. Prior Anticoagulants: The patient has                            taken no previous anticoagulant or antiplatelet                            agents. ASA Grade Assessment: II - A patient with                            mild systemic disease. After reviewing the risks                            and benefits, the patient was deemed in                            satisfactory condition to undergo the procedure.  After obtaining informed consent, the colonoscope                            was passed under direct vision. Throughout the                            procedure, the patient's blood pressure, pulse, and                            oxygen saturations were monitored continuously. The                            Model PCF-H190L [id number]) scope was introduced                            through the anus and advanced to  the the cecum,                            identified by appendiceal orifice and ileocecal                            valve. The ileocecal valve, appendiceal orifice,                            and rectum were photographed. The quality of the                            bowel preparation was good. The colonoscopy was                            performed without difficulty. The patient tolerated                            the procedure well. Scope In: 10:42:46 AM Scope Out: 10:52:37 AM Scope Withdrawal Time: 0 hours 7 minutes 57 seconds  Total Procedure Duration: 0 hours 9 minutes 51 seconds  Findings:                 The perianal and digital rectal examinations were                            normal.                           Internal hemorrhoids were found during                            retroflexion. The hemorrhoids were medium-sized and                            Grade I (internal hemorrhoids that do not prolapse).                           The exam was otherwise without abnormality on  direct and retroflexion views. Complications:            No immediate complications. Estimated blood loss:                            None. Estimated Blood Loss:     Estimated blood loss: none. Impression:               - Internal hemorrhoids.                           - The examination was otherwise normal on direct                            and retroflexion views.                           - No specimens collected. Recommendation:           - Repeat colonoscopy in 10 years for screening                            purposes.                           - Patient has a contact number available for                            emergencies. The signs and symptoms of potential                            delayed complications were discussed with the                            patient. Return to normal activities tomorrow.                            Written discharge instructions  were provided to the                            patient.                           - Resume previous diet.                           - Continue present medications. Ladene Artist, MD 05/27/2016 11:00:26 AM This report has been signed electronically.

## 2016-05-29 ENCOUNTER — Other Ambulatory Visit: Payer: Self-pay | Admitting: Family Medicine

## 2016-05-30 ENCOUNTER — Telehealth: Payer: Self-pay

## 2016-05-30 ENCOUNTER — Telehealth: Payer: Self-pay | Admitting: *Deleted

## 2016-05-30 NOTE — Telephone Encounter (Signed)
  Follow up Call-  Call back number 05/27/2016  Post procedure Call Back phone  # [phone]  Permission to leave phone message Yes  Some recent data might be hidden    Patient was called for follow up after his procedure on 05/27/2016. No answer at the number given for follow up phone call. A message was left on the answering machine. This was the second attempt to contact the patient.

## 2016-05-30 NOTE — Telephone Encounter (Signed)
Message left

## 2016-05-31 ENCOUNTER — Telehealth: Payer: Self-pay

## 2016-05-31 ENCOUNTER — Telehealth: Payer: 59 | Admitting: Family

## 2016-05-31 DIAGNOSIS — R059 Cough, unspecified: Secondary | ICD-10-CM

## 2016-05-31 DIAGNOSIS — R05 Cough: Secondary | ICD-10-CM

## 2016-05-31 DIAGNOSIS — J209 Acute bronchitis, unspecified: Secondary | ICD-10-CM

## 2016-05-31 MED ORDER — PREDNISONE 10 MG (21) PO TBPK: 10.0000 mg | ORAL_TABLET | Freq: Every day | ORAL | 0 refills | Status: DC

## 2016-05-31 NOTE — Telephone Encounter (Signed)
  Follow up Call-  Call back number 05/27/2016  Post procedure Call Back phone  # [phone]  Permission to leave phone message Yes  Some recent data might be hidden     Patient questions:  Do you have a fever, pain , or abdominal swelling? No. Pain Score  0 *  Have you tolerated food without any problems? Yes.    Have you been able to return to your normal activities? Yes.    Do you have any questions about your discharge instructions: Diet   No. Medications  No. Follow up visit  No.  Do you have questions or concerns about your Care? No.  Actions: * If pain score is 4 or above: No action needed, pain <4.

## 2016-05-31 NOTE — Telephone Encounter (Signed)
  Follow up Call-  Call back number 05/27/2016  Post procedure Call Back phone  # [phone]  Permission to leave phone message Yes  Some recent data might be hidden    Patient was called for follow up after his procedure on 05/27/2016. No answer at the number given for follow up phone call. A message was left on the answering machine.

## 2016-05-31 NOTE — Progress Notes (Signed)
We are sorry that you are not feeling well.  Here is how we plan to help!  Based on what you have shared with me it looks like you have upper respiratory tract inflammation that has resulted in a significant cough.  Inflammation and infection in the upper respiratory tract is commonly called bronchitis and has four common causes:  Allergies, Viral Infections, Acid Reflux and Bacterial Infections.  Allergies, viruses and acid reflux are treated by controlling symptoms or eliminating the cause. An example might be a cough caused by taking certain blood pressure medications. You stop the cough by changing the medication. Another example might be a cough caused by acid reflux. Controlling the reflux helps control the cough.  Based on your presentation I believe you most likely have A cough due to a virus.  This is called viral bronchitis and is best treated by rest, plenty of fluids and control of the cough.  You may use Ibuprofen or Tylenol as directed to help your symptoms.     In addition you may use A non-prescription cough medication called Mucinex DM: take 2 tablets every 12 hours.  Sterapred 10 mg dosepak  USE OF BRONCHODILATOR ("RESCUE") INHALERS: There is a risk from using your bronchodilator too frequently.  The risk is that over-reliance on a medication which only relaxes the muscles surrounding the breathing tubes can reduce the effectiveness of medications prescribed to reduce swelling and congestion of the tubes themselves.  Although you feel brief relief from the bronchodilator inhaler, your asthma may actually be worsening with the tubes becoming more swollen and filled with mucus.  This can delay other crucial treatments, such as oral steroid medications. If you need to use a bronchodilator inhaler daily, several times per day, you should discuss this with your provider.  There are probably better treatments that could be used to keep your asthma under control.     HOME CARE . Only take  medications as instructed by your medical team. . Complete the entire course of an antibiotic. . Drink plenty of fluids and get plenty of rest. . Avoid close contacts especially the very young and the elderly . Cover your mouth if you cough or cough into your sleeve. . Always remember to wash your hands . A steam or ultrasonic humidifier can help congestion.   GET HELP RIGHT AWAY IF: . You develop worsening fever. . You become short of breath . You cough up blood. . Your symptoms persist after you have completed your treatment plan MAKE SURE YOU   Understand these instructions.  Will watch your condition.  Will get help right away if you are not doing well or get worse.  Your e-visit answers were reviewed by a board certified advanced clinical practitioner to complete your personal care plan.  Depending on the condition, your plan could have included both over the counter or prescription medications. If there is a problem please reply  once you have received a response from your provider. Your safety is important to [place].  If you have drug allergies check your prescription carefully.    You can use MyChart to ask questions about today's visit, request a non-urgent call back, or ask for a work or school excuse for 24 hours related to this e-Visit. If it has been greater than 24 hours you will need to follow up with your provider, or enter a new e-Visit to address those concerns. You will get an e-mail in the next two days asking about your  experience.  I hope that your e-visit has been valuable and will speed your recovery. Thank you for using e-visits.  

## 2016-06-08 ENCOUNTER — Telehealth: Payer: Self-pay | Admitting: Family Medicine

## 2016-06-08 NOTE — Telephone Encounter (Signed)
Looks like was seen through e-visit- would advise office visit for in person evaluation

## 2016-06-08 NOTE — Telephone Encounter (Signed)
Pt state that he is still coughing and sneezing after having the round of antibiotics.

## 2016-06-09 ENCOUNTER — Encounter: Payer: Self-pay | Admitting: Family Medicine

## 2016-06-09 ENCOUNTER — Ambulatory Visit (INDEPENDENT_AMBULATORY_CARE_PROVIDER_SITE_OTHER): Payer: 59 | Admitting: Family Medicine

## 2016-06-09 VITALS — BP 138/92 | HR 77 | Temp 98.8°F | Wt 151.2 lb

## 2016-06-09 DIAGNOSIS — I10 Essential (primary) hypertension: Secondary | ICD-10-CM

## 2016-06-09 DIAGNOSIS — J329 Chronic sinusitis, unspecified: Secondary | ICD-10-CM | POA: Diagnosis not present

## 2016-06-09 DIAGNOSIS — B9689 Other specified bacterial agents as the cause of diseases classified elsewhere: Secondary | ICD-10-CM | POA: Diagnosis not present

## 2016-06-09 MED ORDER — AZITHROMYCIN 250 MG PO TABS: ORAL_TABLET | ORAL | 0 refills | Status: DC

## 2016-06-09 MED ORDER — HYDROCOD POLST-CPM POLST ER 10-8 MG/5ML PO SUER: 5.0000 mL | Freq: Two times a day (BID) | ORAL | 0 refills | Status: DC | PRN

## 2016-06-09 NOTE — Progress Notes (Signed)
PCP: Garret Reddish, MD  Subjective:  Tyler [name] is a 60 y.o. year old very pleasant male patient who presents with sinusitis symptoms including nasal congestion, sinus tenderness, cough for now 3.5 weeks. Over a week ago did a round of prednisone with short term improvement but worsened again when came off -other symptoms include: mild sore throat particularly with cough -Symptoms are worsening after improving -previous treatments: prednisone, robitussen DM for ough -sick contacts/travel/risks: denies flu exposure.   ROS-denies fever, SOB, NVD, tooth pain, wheeze  Pertinent Past Medical History-  Patient Active Problem List   Diagnosis Date Noted  . Essential hypertension 11/21/2014    Priority: Medium  . BPH (benign prostatic hyperplasia) 11/21/2014    Priority: Medium  . Irritable bowel syndrome 04/17/2008    Priority: Medium  . Anxiety state 12/27/2007    Priority: Medium  . Hyperlipidemia 10/26/2007    Priority: Medium  . Allergic rhinitis 11/21/2014    Priority: Low  . Former smoker 11/21/2014    Priority: Low  . Plantar fasciitis of left foot 10/22/2014    Priority: Low  . Metatarsalgia of both feet 09/26/2014    Priority: Low  . Hematuria 10/05/2013    Priority: Low  . External hemorrhoids 04/17/2008    Priority: Low  . History of colonic polyps 04/16/2008    Priority: Low  . Asthma 12/27/2007    Priority: Low  . GERD 12/27/2007    Priority: Low  . Rosacea 10/26/2007    Priority: Low    Medications- reviewed  Current Outpatient Prescriptions  Medication Sig Dispense Refill  . ALPRAZolam (XANAX) 0.25 MG tablet Take 1 tablet (0.25 mg total) by mouth 3 (three) times daily as needed. 90 tablet 1  . aspirin 81 MG EC tablet Take 81 mg by mouth daily.      . bisoprolol (ZEBETA) 5 MG tablet TAKE 1/2 TABLET BY MOUTH DAILY 15 tablet 5  . Brimonidine Tartrate 0.33 % GEL Apply a pea-size amount once daily as a thin layer across the entire face  for rosacea 30 g  5  . diphenhydramine-acetaminophen (TYLENOL PM) 25-500 MG TABS tablet Take 1 tablet by mouth at bedtime.    . fluticasone (FLONASE) 50 MCG/ACT nasal spray Place 2 sprays into both nostrils daily. 16 g 6  . glycopyrrolate (ROBINUL) 2 MG tablet TAKE 1 TABLET(2 MG) BY MOUTH TWICE DAILY AS NEEDED 60 tablet 2  . hydrocortisone-pramoxine (ANALPRAM HC) 2.5-1 % rectal cream Place 1 application rectally 2 (two) times daily as needed for hemorrhoids or itching. 30 g 0  . Methylcellulose, Laxative, (CITRUCEL) 500 MG TABS Take 2 tablets by mouth 2 (two) times daily.     . multivitamin (THERAGRAN) per tablet Take 1 tablet by mouth daily.      . Omega-3 Fatty Acids (FISH OIL) 1000 MG CAPS Take 2 capsules by mouth daily.      . Probiotic Product (PROBIOTIC DAILY PO) Take 1 capsule by mouth daily.    . rosuvastatin (CRESTOR) 20 MG tablet Take 1 tablet (20 mg total) by mouth once a week. 14 tablet 3  . saw palmetto 160 MG capsule Take 160 mg by mouth daily.    Marland Kitchen azithromycin (ZITHROMAX) 250 MG tablet Take 2 tabs on day 1, then 1 tab daily until finished 6 tablet 0  . chlorpheniramine-HYDROcodone (TUSSIONEX PENNKINETIC ER) 10-8 MG/5ML SUER Take 5 mLs by mouth every 12 (twelve) hours as needed for cough. 115 mL 0   No current facility-administered medications  for this visit.     Objective: BP (!) 138/92 (BP Location: Left Arm, Patient Position: Sitting, Cuff Size: Normal)   Pulse 77   Temp 98.8 F (37.1 C) (Oral)   Wt 151 lb 3.2 oz (68.6 kg)   SpO2 93%   BMI 23.68 kg/m  Gen: NAD, resting comfortably HEENT: Turbinates erythematous with clear and yellow drainage, TM normal, pharynx mildly erythematous with no tonsilar exudate or edema, right maxillary sinus tenderness CV: RRR no murmurs rubs or gallops Lungs: CTAB no crackles, wheeze, rhonchi  Ext: no edema  Assessment/Plan:  Sinsusitis Bacterial based on: Symptoms >10 days. We discussed may also be bronchitis and time would tell.    Treatment: -considered steroid: we opted out for now given recent prednisone, but we will use this if fails azithromycin. Then get chest x-ray if fails both azithromycin and prednisone. Finally, would trial levaquin for persistent symptoms of sinusitis if fails all the above.  -other symptomatic care with tussionex at night- no driving for 8 hours after taking -Antibiotic indicated: yes  Finally, we reviewed reasons to return to care including if symptoms worsen or persist or new concerns arise (particularly fever or shortness of breath)  Reviewed no driving after 8 hours from tussionex. He has tolerated well in past several years ago so requested repeat trial  HTN S: noted mild elevation, home #s continue to look good A/P: as long as home BPs ok- continue bisoprolol 2.5mg  alone  Meds ordered this encounter  Medications  . azithromycin (ZITHROMAX) 250 MG tablet    Sig: Take 2 tabs on day 1, then 1 tab daily until finished    Dispense:  6 tablet    Refill:  0  . chlorpheniramine-HYDROcodone (TUSSIONEX PENNKINETIC ER) 10-8 MG/5ML SUER    Sig: Take 5 mLs by mouth every 12 (twelve) hours as needed for cough.    Dispense:  115 mL    Refill:  0   Garret Reddish, MD

## 2016-06-09 NOTE — Progress Notes (Signed)
Pre visit review using our clinic review tool, if applicable. No additional management support is needed unless otherwise documented below in the visit note. 

## 2016-06-09 NOTE — Patient Instructions (Signed)
Sinsusitis Bacterial based on: Symptoms >10 days. We discussed may also be bronchitis and time would tell.   Treatment: -considered steroid: we opted out for now given recent prednisone, but we will use this if fails azithromycin. Then get chest x-ray if fails both azithromycin and prednisone. Finally, would trial levaquin for persistent symptoms of sinusitis if fails all the above.  -other symptomatic care with tussionex at night- no driving for 8 hours after taking -Antibiotic indicated: yes  Finally, we reviewed reasons to return to care including if symptoms worsen or persist or new concerns arise (particularly fever or shortness of breath)  Meds ordered this encounter  Medications  . azithromycin (ZITHROMAX) 250 MG tablet    Sig: Take 2 tabs on day 1, then 1 tab daily until finished    Dispense:  6 tablet    Refill:  0  . chlorpheniramine-HYDROcodone (TUSSIONEX PENNKINETIC ER) 10-8 MG/5ML SUER    Sig: Take 5 mLs by mouth every 12 (twelve) hours as needed for cough.    Dispense:  115 mL    Refill:  0

## 2016-06-10 NOTE — Telephone Encounter (Signed)
Pt scheduled  

## 2016-10-13 DIAGNOSIS — L723 Sebaceous cyst: Secondary | ICD-10-CM | POA: Diagnosis not present

## 2016-11-27 ENCOUNTER — Encounter: Payer: Self-pay | Admitting: Family Medicine

## 2016-11-28 MED ORDER — HYDROCORTISONE ACETATE 25 MG RE SUPP: 25.0000 mg | Freq: Two times a day (BID) | RECTAL | 1 refills | Status: DC

## 2016-11-28 MED ORDER — HYDROCORTISONE ACE-PRAMOXINE 2.5-1 % RE CREA
1.0000 | TOPICAL_CREAM | Freq: Two times a day (BID) | RECTAL | 0 refills | Status: DC | PRN
Start: 2016-11-28 — End: 2020-09-15

## 2016-11-28 NOTE — Addendum Note (Signed)
Addended by: Marin Olp on: 11/28/2016 04:00 PM   Modules accepted: Orders

## 2016-11-29 ENCOUNTER — Telehealth: Payer: Self-pay | Admitting: Gastroenterology

## 2016-11-29 NOTE — Telephone Encounter (Signed)
Patient was prescribed Canasa in the past for hemorrhoids.  Looks like in 2011.  He has internal hemorrhoid currently that is bothering him.  He was prescribed anusoll yesterday by his primary care as well as analpram.  He has not tried the anusol yet.  He will try this BID for the next several days and if his symptoms fail to improve he will call back.

## 2016-12-05 ENCOUNTER — Other Ambulatory Visit: Payer: Self-pay | Admitting: Family Medicine

## 2016-12-12 ENCOUNTER — Telehealth: Payer: Self-pay | Admitting: Gastroenterology

## 2016-12-12 MED ORDER — MESALAMINE 1000 MG RE SUPP: 1000.0000 mg | Freq: Every day | RECTAL | 1 refills | Status: DC

## 2016-12-12 NOTE — Telephone Encounter (Signed)
Patient notified

## 2016-12-12 NOTE — Telephone Encounter (Signed)
Dr. Fuller Plan you prescribed Canasa in the past for this patient due to his hemorrhoids.  He has recently tried anusol prescribed by his primary care.  Ok to send in [place]? See notes from 03/07/2008.  Please advise

## 2016-12-12 NOTE — Telephone Encounter (Signed)
OK for [employer]

## 2016-12-23 ENCOUNTER — Other Ambulatory Visit: Payer: Self-pay | Admitting: Family Medicine

## 2016-12-27 ENCOUNTER — Telehealth: Payer: Self-pay | Admitting: Gastroenterology

## 2016-12-27 NOTE — Telephone Encounter (Signed)
Left message for patient to call back  

## 2016-12-28 NOTE — Telephone Encounter (Signed)
Patient with knot around or near anus.  He will come in tomorrow and see Nicoletta Ba PA

## 2016-12-29 ENCOUNTER — Ambulatory Visit (INDEPENDENT_AMBULATORY_CARE_PROVIDER_SITE_OTHER): Payer: 59 | Admitting: Physician Assistant

## 2016-12-29 VITALS — BP 132/80 | HR 90 | Ht 67.0 in | Wt 154.0 lb

## 2016-12-29 DIAGNOSIS — K644 Residual hemorrhoidal skin tags: Secondary | ICD-10-CM | POA: Diagnosis not present

## 2016-12-29 NOTE — Progress Notes (Signed)
Subjective:    Patient ID: Tyler [name], male    DOB: 10/11/1955, 61 y.o.   MRN: [id number]  HPI Tyler [name] is a pleasant 61 year old white male known to Tyler [name]. He has history of hypertension, GERD, IBS, polyps and BPH.He underwent colonoscopy in October 2017 for screening. This was negative with the exception of medium internal hemorrhoids. Patient comes in today with concern 03-4 week history of a "lump "on his anus. He says he has had some hemorrhoidal symptoms in the past. When he initially called he was told to use hydrocortisone suppositories and tried that without any improvement and then switched to Canasa suppositories which she says is done over the past 2 weeks again without any change in his symptoms. He is really not having any discomfort or pain any bleeding. He is just concerned be because of the presence of the bump. He says sometimes he has problems with mild constipation and some straining. He also at times has alternating bowel habits. He is using a probiotic regularly, taking stool softeners and fiber supplement.  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 He has backed off on his riding over the past couple of weeks.  Review of Systems Pertinent positive and negative review of systems were noted in the above HPI section.  All other review of systems was otherwise negative.  Outpatient Encounter Prescriptions as of 12/29/2016  Medication Sig  . ALPRAZolam (XANAX) 0.25 MG tablet Take 1 tablet (0.25 mg total) by mouth 3 (three) times daily as needed.  . ANUCORT-HC 25 MG suppository UNWRAP AND INSERT 1 SUPPOSITORY RECTALLY TWICE DAILY  . aspirin 81 MG EC tablet Take 81 mg by mouth daily.    Marland Kitchen azithromycin (ZITHROMAX) 250 MG tablet Take 2 tabs on day 1, then 1 tab daily until finished  . bisoprolol (ZEBETA) 5 MG tablet TAKE 1/2 TABLET(2.5 MG) BY MOUTH DAILY  . Brimonidine Tartrate 0.33 % GEL Apply a pea-size amount once daily as a  thin layer across the entire face  for rosacea  . chlorpheniramine-HYDROcodone (TUSSIONEX PENNKINETIC ER) 10-8 MG/5ML SUER Take 5 mLs by mouth every 12 (twelve) hours as needed for cough.  . diphenhydramine-acetaminophen (TYLENOL PM) 25-500 MG TABS tablet Take 1 tablet by mouth at bedtime.  . fluticasone (FLONASE) 50 MCG/ACT nasal spray Place 2 sprays into both nostrils daily.  Marland Kitchen glycopyrrolate (ROBINUL) 2 MG tablet TAKE 1 TABLET(2 MG) BY MOUTH TWICE DAILY AS NEEDED  . hydrocortisone-pramoxine (ANALPRAM HC) 2.5-1 % rectal cream Place 1 application rectally 2 (two) times daily as needed for hemorrhoids or itching.  . mesalamine (CANASA) 1000 MG suppository Place 1 suppository (1,000 mg total) rectally at bedtime.  . Methylcellulose, Laxative, (CITRUCEL) 500 MG TABS Take 2 tablets by mouth 2 (two) times daily.   . multivitamin (THERAGRAN) per tablet Take 1 tablet by mouth daily.    . Omega-3 Fatty Acids (FISH OIL) 1000 MG CAPS Take 2 capsules by mouth daily.    . Probiotic Product (PROBIOTIC DAILY PO) Take 1 capsule by mouth daily.  . rosuvastatin (CRESTOR) 20 MG tablet Take 1 tablet (20 mg total) by mouth once a week.  . saw palmetto 160 MG capsule Take 160 mg by mouth daily.  . [DISCONTINUED] bisoprolol (ZEBETA) 5 MG tablet TAKE 1/2 TABLET BY MOUTH DAILY   No facility-administered encounter medications on file as of 12/29/2016.    Allergies  Allergen Reactions  . Penicillins    Patient Active  Problem List   Diagnosis Date Noted  . Essential hypertension 11/21/2014  . Allergic rhinitis 11/21/2014  . BPH (benign prostatic hyperplasia) 11/21/2014  . Former smoker 11/21/2014  . Plantar fasciitis of left foot 10/22/2014  . Metatarsalgia of both feet 09/26/2014  . Hematuria 10/05/2013  . External hemorrhoids 04/17/2008  . Irritable bowel syndrome 04/17/2008  . History of colonic polyps 04/16/2008  . Anxiety state 12/27/2007  . Asthma 12/27/2007  . GERD 12/27/2007  . Hyperlipidemia  10/26/2007  . Rosacea 10/26/2007   Social History   Social History  . Marital status: Married    Spouse name: N/A  . Number of children: N/A  . Years of education: N/A   Occupational History  . Not on file.   Social History Main Topics  . Smoking status: Former Smoker    Packs/day: 0.75    Years: 4.00    Types: Cigarettes  . Smokeless tobacco: Never Used  . Alcohol use 0.0 - 1.8 oz/week     Comment: social  . Drug use: No  . Sexual activity: Not on file   Other Topics Concern  . Not on file   Social History Narrative   Married (wife practice at [employer] [name], see mother in Sports coach as well [place] [name]), 3 kids. 30, 27,25 in 2016-2 come to [place] as well. No grandkids.       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       Hobbies: biking, cycling, reading, movies    Tyler [name] family history includes Breast cancer in his mother; Coronary artery disease in his mother; Coronary artery disease (age of onset: 38) in his father; Glaucoma in his mother; Stomach cancer in his maternal uncle.      Objective:    Vitals:   12/29/16 1513  BP: 132/80  Pulse: 90    Physical Exam  well-developed older white male in no acute distress, pleasant blood pressure 132/80 pulse 90, Height 5 foot 7, weight 154, BMI 24.1. HEENT; nontraumatic, cephalic EOMI PERRLA sclera anicteric, Cardiovascular r;egular rate and rhythm with S1-S2 no murmur or gallop, Pulmonary ;clear bilaterally, Abdomen; soft, nontender, bowel sounds are present, rectal exam; patient has an inflamed external hemorrhoid which is nontender. Digital exam is negative., Extremities ;no clubbing cyanosis or edema, Neuropsych; mood and affect appropriate       Assessment & [name]:   #62 61 year old white male with inflamed external hemorrhoid. Patient has no symptoms of discomfort or bleeding. Hemorrhoid is not thrombosed #2  history of internal hemorrhoids #3 IBS #4 hypertension  [name]; Patient will use Analpram cream twice daily which she already has at home over the next couple of weeks. Also advised soaking in a hot tub of water at least once daily for 15-20 minutes to help shrink hemorrhoid He is reassured that this should continue to improve over the next few weeks, and that he should be fine to resume his cycling. Patient will be indicated for follow-up colonoscopy at a 10 year interval. He will follow-up with Tyler [name] or myself on an as-needed basis.   Amy Genia Harold PA-C 12/29/2016   Cc: Marin Olp, MD

## 2016-12-29 NOTE — Patient Instructions (Signed)
Use the Analpram cream, twice daily for 2 weeks.  Hot tub soaks daily for a week.  We have provided you with a brochure on Hemorrhoids.

## 2016-12-31 NOTE — Progress Notes (Signed)
Reviewed and agree with initial management plan.  Malcolm T. Stark, MD FACG 

## 2017-03-30 ENCOUNTER — Other Ambulatory Visit: Payer: Self-pay | Admitting: Family Medicine

## 2017-06-27 ENCOUNTER — Other Ambulatory Visit: Payer: Self-pay | Admitting: Family Medicine

## 2017-07-14 ENCOUNTER — Encounter: Payer: 59 | Admitting: Family Medicine

## 2017-08-18 ENCOUNTER — Ambulatory Visit (INDEPENDENT_AMBULATORY_CARE_PROVIDER_SITE_OTHER): Payer: 59 | Admitting: Family Medicine

## 2017-08-18 ENCOUNTER — Encounter: Payer: Self-pay | Admitting: Family Medicine

## 2017-08-18 VITALS — BP 136/86 | HR 86 | Temp 97.6°F | Ht 68.0 in | Wt 150.8 lb

## 2017-08-18 DIAGNOSIS — Z0000 Encounter for general adult medical examination without abnormal findings: Secondary | ICD-10-CM | POA: Diagnosis not present

## 2017-08-18 DIAGNOSIS — B9789 Other viral agents as the cause of diseases classified elsewhere: Secondary | ICD-10-CM

## 2017-08-18 DIAGNOSIS — E785 Hyperlipidemia, unspecified: Secondary | ICD-10-CM | POA: Diagnosis not present

## 2017-08-18 DIAGNOSIS — N401 Enlarged prostate with lower urinary tract symptoms: Secondary | ICD-10-CM

## 2017-08-18 DIAGNOSIS — F411 Generalized anxiety disorder: Secondary | ICD-10-CM | POA: Diagnosis not present

## 2017-08-18 DIAGNOSIS — J329 Chronic sinusitis, unspecified: Secondary | ICD-10-CM | POA: Diagnosis not present

## 2017-08-18 DIAGNOSIS — K582 Mixed irritable bowel syndrome: Secondary | ICD-10-CM | POA: Diagnosis not present

## 2017-08-18 DIAGNOSIS — R351 Nocturia: Secondary | ICD-10-CM | POA: Diagnosis not present

## 2017-08-18 DIAGNOSIS — I10 Essential (primary) hypertension: Secondary | ICD-10-CM | POA: Diagnosis not present

## 2017-08-18 DIAGNOSIS — Z23 Encounter for immunization: Secondary | ICD-10-CM | POA: Diagnosis not present

## 2017-08-18 LAB — CBC
HEMATOCRIT: 49.2 % (ref 39.0–52.0)
Hemoglobin: 16.6 g/dL (ref 13.0–17.0)
MCHC: 33.8 g/dL (ref 30.0–36.0)
MCV: 87.5 fl (ref 78.0–100.0)
PLATELETS: 303 10*3/uL (ref 150.0–400.0)
RBC: 5.63 Mil/uL (ref 4.22–5.81)
RDW: 14.1 % (ref 11.5–15.5)
WBC: 6.7 10*3/uL (ref 4.0–10.5)

## 2017-08-18 LAB — COMPREHENSIVE METABOLIC PANEL
ALT: 11 U/L (ref 0–53)
AST: 15 U/L (ref 0–37)
Albumin: 4.4 g/dL (ref 3.5–5.2)
Alkaline Phosphatase: 46 U/L (ref 39–117)
BUN: 21 mg/dL (ref 6–23)
CALCIUM: 9.6 mg/dL (ref 8.4–10.5)
CHLORIDE: 106 meq/L (ref 96–112)
CO2: 26 mEq/L (ref 19–32)
CREATININE: 0.96 mg/dL (ref 0.40–1.50)
GFR: 84.19 mL/min (ref >60.00)
Glucose, Bld: 103 mg/dL — ABNORMAL HIGH (ref 70–99)
POTASSIUM: 4 meq/L (ref 3.5–5.1)
Sodium: 139 mEq/L (ref 135–145)
Total Bilirubin: 0.6 mg/dL (ref 0.2–1.2)
Total Protein: 6.7 g/dL (ref 6.0–8.3)

## 2017-08-18 LAB — LIPID PANEL
CHOL/HDL RATIO: 3
Cholesterol: 151 mg/dL (ref 0–200)
HDL: 45.3 mg/dL (ref >39.00)
LDL CALC: 84 mg/dL (ref 0–99)
NonHDL: 105.29
TRIGLYCERIDES: 108 mg/dL (ref 0.0–149.0)
VLDL: 21.6 mg/dL (ref 0.0–40.0)

## 2017-08-18 LAB — PSA: PSA: 2.05 ng/mL (ref 0.10–4.00)

## 2017-08-18 MED ORDER — FLUTICASONE PROPIONATE 50 MCG/ACT NA SUSP: 2.0000 | Freq: Every day | NASAL | 6 refills | Status: DC

## 2017-08-18 MED ORDER — GLYCOPYRROLATE 2 MG PO TABS: ORAL_TABLET | ORAL | 1 refills | Status: DC

## 2017-08-18 MED ORDER — BISOPROLOL FUMARATE 5 MG PO TABS: ORAL_TABLET | ORAL | 3 refills | Status: DC

## 2017-08-18 NOTE — Patient Instructions (Addendum)
try flonase 2 sprays each nostril for 2 weeks, if not improved add allegra back in as well for another 2 weeks. If drastically improved, go down to 1 spray each nostril for another 2 weeks. Ok to come off if full resolution  Otherwise- I think you are doing great! Glad you finally found time for yourself and went on vacation- hope you can continue to find some time over next year

## 2017-08-18 NOTE — Progress Notes (Signed)
Phone: [phone]  Subjective:  Patient presents today for their annual physical. Chief complaint-noted.   See problem oriented charting- ROS- full  review of systems was completed and negative except for: runny nose, dry throat, seasonal allergies, anxiety at times  The following were reviewed and entered/updated in epic: Past Medical History:  Diagnosis Date  . Allergy   . Anxiety   . Asthma   . Cataract    beginning  . Colon polyps   . GERD (gastroesophageal reflux disease)   . Hematuria 10/05/2013  . Hemorrhoids   . Hyperlipidemia   . IBS (irritable bowel syndrome)    alt diarrhea/ constipation  . Rosacea    Patient Active Problem List   Diagnosis Date Noted  . Essential hypertension 11/21/2014    Priority: Medium  . BPH (benign prostatic hyperplasia) 11/21/2014    Priority: Medium  . Irritable bowel syndrome 04/17/2008    Priority: Medium  . Anxiety state 12/27/2007    Priority: Medium  . Hyperlipidemia 10/26/2007    Priority: Medium  . Allergic rhinitis 11/21/2014    Priority: Low  . Former smoker 11/21/2014    Priority: Low  . Plantar fasciitis of left foot 10/22/2014    Priority: Low  . Metatarsalgia of both feet 09/26/2014    Priority: Low  . Hematuria 10/05/2013    Priority: Low  . External hemorrhoids 04/17/2008    Priority: Low  . History of colonic polyps 04/16/2008    Priority: Low  . Asthma 12/27/2007    Priority: Low  . GERD 12/27/2007    Priority: Low  . Rosacea 10/26/2007    Priority: Low   Past Surgical History:  Procedure Laterality Date  . COLONOSCOPY  07/28/2006    Family History  Problem Relation Age of Onset  . Glaucoma Mother        and father  . Coronary artery disease Mother        late 70s-cabg  . Breast cancer Mother   . Coronary artery disease Father 79       death 76, presumed due to MI as sudden  . Stomach cancer Maternal Uncle   . Colon cancer Neg Hx   . Colon polyps Neg Hx   . Esophageal cancer Neg Hx   .  Rectal cancer Neg Hx     Medications- reviewed and updated Current Outpatient Medications  Medication Sig Dispense Refill  . ALPRAZolam (XANAX) 0.25 MG tablet Take 1 tablet (0.25 mg total) by mouth 3 (three) times daily as needed. 90 tablet 1  . ANUCORT-HC 25 MG suppository UNWRAP AND INSERT 1 SUPPOSITORY RECTALLY TWICE DAILY 12 suppository 0  . aspirin 81 MG EC tablet Take 81 mg by mouth daily.      . bisoprolol (ZEBETA) 5 MG tablet TAKE 1/2 TABLET(2.5 MG) BY MOUTH DAILY 45 tablet 1  . Brimonidine Tartrate 0.33 % GEL Apply a pea-size amount once daily as a thin layer across the entire face  for rosacea 30 g 5  . diphenhydramine-acetaminophen (TYLENOL PM) 25-500 MG TABS tablet Take 1 tablet by mouth at bedtime.    . fluticasone (FLONASE) 50 MCG/ACT nasal spray Place 2 sprays into both nostrils daily. 16 g 6  . glycopyrrolate (ROBINUL) 2 MG tablet TAKE 1 TABLET(2 MG) BY MOUTH TWICE DAILY AS NEEDED 180 tablet 1  . hydrocortisone-pramoxine (ANALPRAM HC) 2.5-1 % rectal cream Place 1 application rectally 2 (two) times daily as needed for hemorrhoids or itching. 30 g 0  . mesalamine (CANASA)  1000 MG suppository Place 1 suppository (1,000 mg total) rectally at bedtime. 30 suppository 1  . Methylcellulose, Laxative, (CITRUCEL) 500 MG TABS Take 2 tablets by mouth 2 (two) times daily.     . multivitamin (THERAGRAN) per tablet Take 1 tablet by mouth daily.      . Omega-3 Fatty Acids (FISH OIL) 1000 MG CAPS Take 2 capsules by mouth daily.      . Probiotic Product (PROBIOTIC DAILY PO) Take 1 capsule by mouth daily.    . rosuvastatin (CRESTOR) 20 MG tablet TAKE 1 TABLET BY MOUTH EVERY WEEK 14 tablet 1  . saw palmetto 160 MG capsule Take 160 mg by mouth daily.     No current facility-administered medications for this visit.     Allergies-reviewed and updated Allergies  Allergen Reactions  . Penicillins     Social History   Socioeconomic History  . Marital status: Married    Spouse name: None  .  Number of children: None  . Years of education: None  . Highest education level: None  Social Needs  . Financial resource strain: None  . Food insecurity - worry: None  . Food insecurity - inability: None  . Transportation needs - medical: None  . Transportation needs - non-medical: None  Occupational History  . None  Tobacco Use  . Smoking status: Former Smoker    Packs/day: 0.75    Years: 4.00    Pack years: 3.00    Types: Cigarettes  . Smokeless tobacco: Never Used  Substance and Sexual Activity  . Alcohol use: Yes    Alcohol/week: 0.0 - 1.8 oz    Comment: social  . Drug use: No  . Sexual activity: None  Other Topics Concern  . None  Social History Narrative   Married (wife Network engineer at [place], see mother in Sports coach as well [place]), 3 kids. 30, 27,25 in 2016-2 come to [place] as well. No grandkids.       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       Hobbies: biking, cycling, reading, movies    Objective: BP 136/86 (BP Location: Left Arm, Patient Position: Sitting, Cuff Size: Large)   Pulse 86   Temp 97.6 F (36.4 C) (Oral)   Ht 5\' 8"  (1.727 m)   Wt 150 lb 12.8 oz (68.4 kg)   SpO2 98%   BMI 22.93 kg/m  Gen: NAD, resting comfortably HEENT: Mucous membranes are moist. Oropharynx normal Neck: no thyromegaly CV: RRR no murmurs rubs or gallops Lungs: CTAB no crackles, wheeze, rhonchi Abdomen: soft/nontender/nondistended/normal bowel sounds. No rebound or guarding.  Ext: no edema Skin: warm, dry Neuro: grossly normal, moves all extremities, PERRLA Rectal: normal tone, diffusely enlarged prostate, no masses or tenderness  Assessment/Plan:  62 y.o. male presenting for annual physical.  Health Maintenance counseling: 1. Anticipatory guidance: Patient counseled regarding regular dental exams -q6 months, eye exams -yearly or so (may need cataract surgery  soon), wearing seatbelts.  2. Risk factor reduction:  Advised patient of need for regular exercise and diet rich and fruits and vegetables to reduce risk of heart attack and stroke. Exercise- in the past up to 30 miles of biking per week  (weather permitting) and treadmill on most other days-keeping this up- also doing some rowing. Diet-reasonably clean. No soft drink in a month.  Wt Readings from Last 3 Encounters:  08/18/17 150 lb 12.8 oz (68.4 kg)  12/29/16 154 lb (69.9 kg)  06/09/16 151 lb 3.2 oz (68.6 kg)  3. Immunizations/screenings/ancillary studies- flu shot today. Discussed shingrix availability issues.  Immunization History  Administered Date(s) Administered  . Influenza,inj,Quad PF,6+ Mos 04/22/2013, 08/18/2017  . Td 10/26/2007  4. Prostate cancer screening-   Has seen urology in the past- no follow up advised. Trend below has been rather low risk as has rectal exam. Takes saw palmetto. Some BPH on exam with nocturia x1 Lab Results  Component Value Date   PSA 1.68 03/03/2016   PSA 1.70 11/21/2014   PSA 1.09 04/15/2013   5. Colon cancer screening - 05/27/2016 with 10 year repeat 6. Skin cancer screening- has been seeing Dr. Pearline Cables for dermatology and skin exams.  advised regular sunscreen use. Denies worrisome, changing, or new skin lesions.   Status of chronic or acute concerns   HTN- controlled on bisoprolol 2.5mg  at home. Has some white coat hypertension- always higher in office. Home readings 120s/80s.  BP Readings from Last 3 Encounters:  08/18/17 136/86  12/29/16 132/80  06/09/16 (!) 138/92   Anxiety- sparing xanax for plane flights and doctor visits and public speaking- still has some of this on hand.   HLD- on crestor 20mg  weekly. Update lipids but LDL has been under 70  IBS mixed- stable on probiotic and robinul 2mg  BID prn. Sometimes provokes hemorrhoids as below  Hemorrhoids- followed by GI. CANASA helpful in the past- rx per GI. I sent in analpram [facility] last year  which was not adequate for him  Sees dermatology for rosacea- will send [place] the name but sounds like metronidazole  Seasonal allergies- allegra in the past. Constantly blowing nose, intermittent cough. Has tried mucinex. Mucinex and allegra not helping much. Going on for months.  -try flonase 2 sprays each nostril for 2 weeks, if not improved add allegra back in as well for another 2 weeks. If drastically improved, go down to 1 spray each nostril for another 2 weeks. Ok to come off if full resolution No problem-specific Assessment & Plan notes found for this encounter.   No future appointments. No Follow-up on file.  Lab/Order associations: Preventative health care  Need for prophylactic vaccination and inoculation against influenza - Plan: Flu Vaccine QUAD 36+ mos IM  Viral sinusitis - Plan: fluticasone (FLONASE) 50 MCG/ACT nasal spray  Meds ordered this encounter  Medications  . glycopyrrolate (ROBINUL) 2 MG tablet    Sig: TAKE 1 TABLET(2 MG) BY MOUTH TWICE DAILY AS NEEDED    Dispense:  180 tablet    Refill:  1  . fluticasone (FLONASE) 50 MCG/ACT nasal spray    Sig: Place 2 sprays into both nostrils daily.    Dispense:  16 g    Refill:  6    Return precautions advised.  Garret Reddish, MD

## 2017-10-01 ENCOUNTER — Other Ambulatory Visit: Payer: Self-pay | Admitting: Family Medicine

## 2017-10-17 DIAGNOSIS — L218 Other seborrheic dermatitis: Secondary | ICD-10-CM | POA: Diagnosis not present

## 2017-10-17 DIAGNOSIS — L718 Other rosacea: Secondary | ICD-10-CM | POA: Diagnosis not present

## 2017-11-22 ENCOUNTER — Ambulatory Visit: Payer: 59 | Admitting: Family Medicine

## 2017-11-22 ENCOUNTER — Encounter: Payer: Self-pay | Admitting: Family Medicine

## 2017-11-22 DIAGNOSIS — K219 Gastro-esophageal reflux disease without esophagitis: Secondary | ICD-10-CM

## 2017-11-22 DIAGNOSIS — J301 Allergic rhinitis due to pollen: Secondary | ICD-10-CM

## 2017-11-22 DIAGNOSIS — R002 Palpitations: Secondary | ICD-10-CM

## 2017-11-22 DIAGNOSIS — R413 Other amnesia: Secondary | ICD-10-CM | POA: Insufficient documentation

## 2017-11-22 NOTE — Patient Instructions (Addendum)
Health Maintenance Due  Topic Date Due  . TETANUS/TDAP - Today at Office Visit 10/25/2017   If you continue to have palpitations once a day into next week- give me a call and we will set up a 3 day heart monitor. Also if you were to have chest pain, shortness of breath, dizziness, other new symptoms- lets sit back down to chat asap.   try lansoprazole before dinner. Could also try gas x  Your memory test looks great with Mini-Mental status exam scoring 30 out of 30.  Dementia is considered for score 24 or below.  We can continue to follow this every year or 2 if you would like.  You might consider taking a B12 vitamin if you are not already as low B12 has been associated with memory loss-though I do not strongly suspect yours is low, lansoprazole does slightly increase risk of low B12.

## 2017-11-22 NOTE — Assessment & Plan Note (Signed)
S: Patient has had palpitations for years. 1st time was after dad passed from heart attack- threw his cigarettes away.   Then palpitations returned years ago. He ended up having therapy - thinking he was going to die. Didn't have to take medicine last time. Had stress test  And EKGwith this which was reassuring.  Saturday went to gym - regular routine plus then did some rowing. Then over his nipple every few minutes would feel spasm/tingling sensation/palpitations. Made him very anxious. Sunday morning less prevalent but still there- took half xanax and helped relax him. Has felt once a day at least except for today. No chest pain, or shortness of breath, or left arm or neck pain, or dizziness or nausea or vomiting. Has nerve issue in neck- gets tingling into left fingertips at times but not worse recently. Does not want to have to go back to therapy.   In general doesn't feel like anxiety is high -has Xanax on hand for doctor's visits and plane flights only.  He has used a few in recent days though.  A/P: These palpitations could be noncardiac. Also could be benign rhythm like PACs or PVCs. We discussed doing holter monitor but since he has not had palpitations yet today and likely by the time we could get monitor set up-he will be out of town next week.  He preferred to monitor and if he continues to have recurrence- set up a Holter monitor at that time.  He sought reassurance was the most important thing today-I agree.  I do not see any red flags from the history.  He asks about using a regular anxiety medicine but since his symptoms are so intermittent we opted to not pursue this.  If we did find PACs or PVCs could consider beta-blocker

## 2017-11-22 NOTE — Assessment & Plan Note (Signed)
S: Throat drainage mild even with flonase- particularly in AM. He suspects related to allergies A/P: we discussed tiral of allegra added to flonase for a week- then could stop flonase if effective and later wean from [employer]

## 2017-11-22 NOTE — Assessment & Plan Note (Signed)
S: memory per wife is "terrible". Slipping some at work. He doesn't feel major issue A/P: MMSE 30/30 today. Did discuss could try b12 supplement given b12 use. Can recheck MMSE every year or so if continues to complain of this or sooner if symptoms worsen

## 2017-11-22 NOTE — Assessment & Plan Note (Signed)
S: burping some particularly at night- wonders if related to reflux. Takes lansoprazole each morning. Drinking plenty of water, having reasonable bowel movements.  A/P: we discussed that he could try lansoprazole before dinner. Could also try gas x. Try each for up to a week- should let us know if not improving.

## 2017-11-22 NOTE — Progress Notes (Signed)
Subjective:  Tyler [name] is a 62 y.o. year old very pleasant male patient who presents for/with See problem oriented charting ROS- No chest pain or shortness of breath. No headache or blurry vision.   No dizziness or a left arm or hand weakness.  Past Medical History-  Patient Active Problem List   Diagnosis Date Noted  . Palpitations 11/22/2017    Priority: Medium  . Memory difficulties 11/22/2017    Priority: Medium  . Essential hypertension 11/21/2014    Priority: Medium  . BPH (benign prostatic hyperplasia) 11/21/2014    Priority: Medium  . Irritable bowel syndrome 04/17/2008    Priority: Medium  . Anxiety state 12/27/2007    Priority: Medium  . Hyperlipidemia 10/26/2007    Priority: Medium  . Allergic rhinitis 11/21/2014    Priority: Low  . Former smoker 11/21/2014    Priority: Low  . Plantar fasciitis of left foot 10/22/2014    Priority: Low  . Metatarsalgia of both feet 09/26/2014    Priority: Low  . Hematuria 10/05/2013    Priority: Low  . External hemorrhoids 04/17/2008    Priority: Low  . History of colonic polyps 04/16/2008    Priority: Low  . Asthma 12/27/2007    Priority: Low  . GERD 12/27/2007    Priority: Low  . Rosacea 10/26/2007    Priority: Low    Medications- reviewed and updated Current Outpatient Medications  Medication Sig Dispense Refill  . ALPRAZolam (XANAX) 0.25 MG tablet Take 1 tablet (0.25 mg total) by mouth 3 (three) times daily as needed. 90 tablet 1  . aspirin 81 MG EC tablet Take 81 mg by mouth daily.      . bisoprolol (ZEBETA) 5 MG tablet TAKE 1/2 TABLET(2.5 MG) BY MOUTH DAILY 45 tablet 3  . diphenhydramine-acetaminophen (TYLENOL PM) 25-500 MG TABS tablet Take 1 tablet by mouth at bedtime.    . fluticasone (FLONASE) 50 MCG/ACT nasal spray Place 2 sprays into both nostrils daily. 16 g 6  . glycopyrrolate (ROBINUL) 2 MG tablet TAKE 1 TABLET(2 MG) BY MOUTH TWICE DAILY AS NEEDED 180 tablet 1  . hydrocortisone-pramoxine (ANALPRAM  HC) 2.5-1 % rectal cream Place 1 application rectally 2 (two) times daily as needed for hemorrhoids or itching. 30 g 0  . mesalamine (CANASA) 1000 MG suppository Place 1 suppository (1,000 mg total) rectally at bedtime. 30 suppository 1  . Methylcellulose, Laxative, (CITRUCEL) 500 MG TABS Take 2 tablets by mouth 2 (two) times daily.     . multivitamin (THERAGRAN) per tablet Take 1 tablet by mouth daily.      . Omega-3 Fatty Acids (FISH OIL) 1000 MG CAPS Take 2 capsules by mouth daily.      . Probiotic Product (PROBIOTIC DAILY PO) Take 1 capsule by mouth daily.    . rosuvastatin (CRESTOR) 20 MG tablet TAKE 1 TABLET BY MOUTH EVERY WEEK 14 tablet 1  . saw palmetto 160 MG capsule Take 160 mg by mouth daily.     No current facility-administered medications for this visit.     Objective: BP 128/88 (BP Location: Left Arm, Patient Position: Sitting, Cuff Size: Normal)   Pulse 75   Temp 98.4 F (36.9 C) (Oral)   Ht 5\' 8"  (1.727 m)   Wt 156 lb 3.2 oz (70.9 kg)   SpO2 95%   BMI 23.75 kg/m  Gen: NAD, resting comfortably CV: RRR no murmurs rubs or gallops Lungs: CTAB no crackles, wheeze, rhonchi Abdomen: soft/nontender/nondistended/normal bowel sounds. Ext: no edema  Skin: warm, dry Neuro: Normal gait and speech  Assessment/Plan:  Palpitations S: Patient has had palpitations for years. 1st time was after dad passed from heart attack- threw his cigarettes away.   Then palpitations returned years ago. He ended up having therapy - thinking he was going to die. Didn't have to take medicine last time. Had stress test  And EKGwith this which was reassuring.  Saturday went to gym - regular routine plus then did some rowing. Then over his nipple every few minutes would feel spasm/tingling sensation/palpitations. Made him very anxious. Sunday morning less prevalent but still there- took half xanax and helped relax him. Has felt once a day at least except for today. No chest pain, or shortness of  breath, or left arm or neck pain, or dizziness or nausea or vomiting. Has nerve issue in neck- gets tingling into left fingertips at times but not worse recently. Does not want to have to go back to therapy.   In general doesn't feel like anxiety is high -has Xanax on hand for doctor's visits and plane flights only.  He has used a few in recent days though.  A/P: These palpitations could be noncardiac. Also could be benign rhythm like PACs or PVCs. We discussed doing holter monitor but since he has not had palpitations yet today and likely by the time we could get monitor set up-he will be out of town next week.  He preferred to monitor and if he continues to have recurrence- set up a Holter monitor at that time.  He sought reassurance was the most important thing today-I agree.  I do not see any red flags from the history.  He asks about using a regular anxiety medicine but since his symptoms are so intermittent we opted to not pursue this.  If we did find PACs or PVCs could consider beta-blocker  GERD S: burping some particularly at night- wonders if related to reflux. Takes lansoprazole each morning. Drinking plenty of water, having reasonable bowel movements.  A/P: we discussed that he could try lansoprazole before dinner. Could also try gas x. Try each for up to a week- should let us know if not improving.   Memory difficulties S: memory per wife is "terrible". Slipping some at work. He doesn't feel major issue A/P: MMSE 30/30 today. Did discuss could try b12 supplement given b12 use. Can recheck MMSE every year or so if continues to complain of this or sooner if symptoms worsen   Allergic rhinitis S: Throat drainage mild even with flonase- particularly in AM. He suspects related to allergies A/P: we discussed tiral of allegra added to flonase for a week- then could stop flonase if effective and later wean from allegra  Please note we opted not to get EKG today as not having active  palpitations.  Please also note he feels that the palpitations are very superficial such as right around the nipple and he does not feel deep in the chest  Return precautions advised.  Garret Reddish, MD

## 2017-11-24 ENCOUNTER — Ambulatory Visit: Payer: 59 | Admitting: Family Medicine

## 2018-01-05 DIAGNOSIS — L03012 Cellulitis of left finger: Secondary | ICD-10-CM | POA: Diagnosis not present

## 2018-01-05 DIAGNOSIS — A35 Other tetanus: Secondary | ICD-10-CM | POA: Diagnosis not present

## 2018-01-19 DIAGNOSIS — L03039 Cellulitis of unspecified toe: Secondary | ICD-10-CM | POA: Diagnosis not present

## 2018-02-19 ENCOUNTER — Telehealth: Payer: 59 | Admitting: Family

## 2018-02-19 DIAGNOSIS — R12 Heartburn: Secondary | ICD-10-CM

## 2018-02-19 MED ORDER — OMEPRAZOLE 20 MG PO CPDR: 20.0000 mg | DELAYED_RELEASE_CAPSULE | Freq: Every day | ORAL | 0 refills | Status: DC

## 2018-02-19 NOTE — Progress Notes (Signed)
Thank you for the details you included in the comment boxes. Those details are very helpful in determining the best course of treatment for you and help [place] to provide the best care.  We are sorry that you are not feeling well.  Here is how we plan to help!  Based on what you shared with me it looks like you most likely have Gastroesophageal Reflux Disease (GERD)  Gastroesophageal reflux disease (GERD) happens when acid from your stomach flows up into the esophagus.  When acid comes in contact with the esophagus, the acid causes sorenss (inflammation) in the esophagus.  Over time, GERD may create small holes (ulcers) in the lining of the esophagus.  I have prescribed Omeprazole, a Protein Pump inhibitor, 20 mg daily until you follow up with a provider.  Your symptoms should improve in the next day or two.  You can use antacids as needed until symptoms resolve.  Call us if your heartburn worsens, you have trouble swallowing, weight loss, spitting up blood or recurrent vomiting.  Home Care:  May include lifestyle changes such as weight loss, quitting smoking and alcohol consumption  Avoid foods and drinks that make your symptoms worse, such as:  Caffeine or alcoholic drinks  Chocolate  Peppermint or mint flavorings  Garlic and onions  Spicy foods  Citrus fruits, such as oranges, lemons, or limes  Tomato-based foods such as sauce, chili, salsa and pizza  Fried and fatty foods  Avoid lying down for 3 hours prior to your bedtime or prior to taking a nap  Eat small, frequent meals instead of a large meals  Wear loose-fitting clothing.  Do not wear anything tight around your waist that causes pressure on your stomach.  Raise the head of your bed 6 to 8 inches with wood blocks to help you sleep.  Extra pillows will not help.  Seek Help Right Away If:  You have pain in your arms, neck, jaw, teeth or back  Your pain increases or changes in intensity or duration  You develop  nausea, vomiting or sweating (diaphoresis)  You develop shortness of breath or you faint  Your vomit is green, yellow, black or looks like coffee grounds or blood  Your stool is red, bloody or black  These symptoms could be signs of other problems, such as heart disease, gastric bleeding or esophageal bleeding.  Make sure you :  Understand these instructions.  Will watch your condition.  Will get help right away if you are not doing well or get worse.  Your e-visit answers were reviewed by a board certified advanced clinical practitioner to complete your personal care plan.  Depending on the condition, your plan could have included both over the counter or prescription medications.  If there is a problem please reply  once you have received a response from your provider.  Your safety is important to [place].  If you have drug allergies check your prescription carefully.    You can use MyChart to ask questions about today's visit, request a non-urgent call back, or ask for a work or school excuse for 24 hours related to this e-Visit. If it has been greater than 24 hours you will need to follow up with your provider, or enter a new e-Visit to address those concerns.  You will get an e-mail in the next two days asking about your experience.  I hope that your e-visit has been valuable and will speed your recovery. Thank you for using e-visits.

## 2018-03-20 ENCOUNTER — Ambulatory Visit: Payer: 59 | Admitting: Family Medicine

## 2018-03-20 ENCOUNTER — Encounter: Payer: Self-pay | Admitting: Family Medicine

## 2018-03-20 VITALS — BP 140/90 | HR 78 | Temp 98.3°F | Ht 68.0 in | Wt 156.2 lb

## 2018-03-20 DIAGNOSIS — K219 Gastro-esophageal reflux disease without esophagitis: Secondary | ICD-10-CM | POA: Diagnosis not present

## 2018-03-20 DIAGNOSIS — I10 Essential (primary) hypertension: Secondary | ICD-10-CM

## 2018-03-20 MED ORDER — OMEPRAZOLE 40 MG PO CPDR: 40.0000 mg | DELAYED_RELEASE_CAPSULE | Freq: Every day | ORAL | 2 refills | Status: DC

## 2018-03-20 NOTE — Patient Instructions (Addendum)
Stop lansoprazole and prilosec 20mg   Start prilosec 40mg  daily at breakfast  If you have another tougher episode like Sunday- lets go ahead and get an ultrasound of abdomen. I would also get labs if doesn't improve  After 2 months- try to go back to over the counter 20mg  prilosec/omeprazole (I can also send this in for you if you mychart me)- as long as you are not having any episodes for at least 2-3 weeks prior to transition

## 2018-03-20 NOTE — Progress Notes (Signed)
Subjective:  Tyler [name] is a 62 y.o. year old very pleasant male patient who presents for/with See problem oriented charting ROS- has had some cough, epigastric pain at times radiating to back particularly after spicy foods.  No fever or chills. No chest pain reported or shortness of breath.   Past Medical History-  Patient Active Problem List   Diagnosis Date Noted  . Palpitations 11/22/2017    Priority: Medium  . Memory difficulties 11/22/2017    Priority: Medium  . Essential hypertension 11/21/2014    Priority: Medium  . BPH (benign prostatic hyperplasia) 11/21/2014    Priority: Medium  . Irritable bowel syndrome 04/17/2008    Priority: Medium  . Anxiety state 12/27/2007    Priority: Medium  . Hyperlipidemia 10/26/2007    Priority: Medium  . Allergic rhinitis 11/21/2014    Priority: Low  . Former smoker 11/21/2014    Priority: Low  . Plantar fasciitis of left foot 10/22/2014    Priority: Low  . Metatarsalgia of both feet 09/26/2014    Priority: Low  . Hematuria 10/05/2013    Priority: Low  . External hemorrhoids 04/17/2008    Priority: Low  . History of colonic polyps 04/16/2008    Priority: Low  . Asthma 12/27/2007    Priority: Low  . GERD 12/27/2007    Priority: Low  . Rosacea 10/26/2007    Priority: Low    Medications- reviewed and updated Current Outpatient Medications  Medication Sig Dispense Refill  . ALPRAZolam (XANAX) 0.25 MG tablet Take 1 tablet (0.25 mg total) by mouth 3 (three) times daily as needed. 90 tablet 1  . bisoprolol (ZEBETA) 5 MG tablet TAKE 1/2 TABLET(2.5 MG) BY MOUTH DAILY 45 tablet 3  . diphenhydramine-acetaminophen (TYLENOL PM) 25-500 MG TABS tablet Take 1 tablet by mouth at bedtime.    . fluticasone (FLONASE) 50 MCG/ACT nasal spray Place 2 sprays into both nostrils daily. 16 g 6  . glycopyrrolate (ROBINUL) 2 MG tablet TAKE 1 TABLET(2 MG) BY MOUTH TWICE DAILY AS NEEDED 180 tablet 1  . hydrocortisone-pramoxine (ANALPRAM HC) 2.5-1 %  rectal cream Place 1 application rectally 2 (two) times daily as needed for hemorrhoids or itching. 30 g 0  . mesalamine (CANASA) 1000 MG suppository Place 1 suppository (1,000 mg total) rectally at bedtime. 30 suppository 1  . Methylcellulose, Laxative, (CITRUCEL) 500 MG TABS Take 2 tablets by mouth 2 (two) times daily.     . multivitamin (THERAGRAN) per tablet Take 1 tablet by mouth daily.      . Omega-3 Fatty Acids (FISH OIL) 1000 MG CAPS Take 2 capsules by mouth daily.      . Probiotic Product (PROBIOTIC DAILY PO) Take 1 capsule by mouth daily.    . rosuvastatin (CRESTOR) 20 MG tablet TAKE 1 TABLET BY MOUTH EVERY WEEK 14 tablet 1  . saw palmetto 160 MG capsule Take 160 mg by mouth daily.    Marland Kitchen omeprazole (PRILOSEC) 40 MG capsule Take 1 capsule (40 mg total) by mouth daily. 30 capsule 2   No current facility-administered medications for this visit.     Objective: BP 140/90 (BP Location: Left Arm, Cuff Size: Normal)   Pulse 78   Temp 98.3 F (36.8 C) (Oral)   Ht 5\' 8"  (1.727 m)   Wt 156 lb 4 oz (70.9 kg)   SpO2 97%   BMI 23.76 kg/m  Gen: NAD, resting comfortably CV: RRR no murmurs rubs or gallops Lungs: CTAB no crackles, wheeze, rhonchi Abdomen: soft/nontender  except for very mild pain in back when palpating LUQ- belches after exam/nondistended/normal bowel sounds. No rebound or guarding.  Ext: no edema Skin: warm, dry  Assessment/Plan:   S:  Over past 5-6 weeks, several episodes of pressure, burping in upper abdomen- radiated some to the back. Did an e visit and prescribed 20mg  omeprazole- seemed to be working until Sunday night when had an episode that kept him up most of the night. He states he was told to continue lasoprazole at same time. Was at nighttime. That evening he ate [place] food that was spicier than usual for him. Also had a coke.  Did fine for a month until Sunday night. Also has had some cough with this.   Was doing both lansoprazole and prilosec in AM, then over  last 2 days did 1 in AM and 1 in PM. Burping actually helped relief of symptoms when flared up.   Still able to ride 25 miles Saturday with no issues.  A/P: I strongly suspect acid reflux as cause.  Likely just had breakthrough this weekend with heavy spicy meal.  Could be gallstones but do not strongly suspect this. From AVS:  " Stop lansoprazole and prilosec 20mg   Start prilosec 40mg  daily at breakfast  If you have another tougher episode like Sunday- lets go ahead and get an ultrasound of abdomen. I would also get labs if doesn't improve  After 2 months- try to go back to over the counter 20mg  prilosec/omeprazole (I can also send this in for you if you mychart me)- as long as you are not having any episodes for at least 2-3 weeks prior to transition "  If get labs-would get Cbc, cmp, lpiase *  Essential hypertension S: controlled poorly on bisoprolol 2.5 mg BP Readings from Last 3 Encounters:  03/20/18 140/90  11/22/17 128/88  08/18/17 136/86  A/P: Patient thinks his blood pressure is up slightly due to some back pain today.  We will repeat at follow-up and may need to adjust medications if remains elevated.     Meds ordered this encounter  Medications  . omeprazole (PRILOSEC) 40 MG capsule    Sig: Take 1 capsule (40 mg total) by mouth daily.    Dispense:  30 capsule    Refill:  2    Return precautions advised.  Garret Reddish, MD

## 2018-03-21 NOTE — Assessment & Plan Note (Signed)
S: controlled poorly on bisoprolol 2.5 mg BP Readings from Last 3 Encounters:  03/20/18 140/90  11/22/17 128/88  08/18/17 136/86  A/P: Patient thinks his blood pressure is up slightly due to some back pain today.  We will repeat at follow-up and may need to adjust medications if remains elevated.

## 2018-04-28 ENCOUNTER — Other Ambulatory Visit: Payer: Self-pay | Admitting: Family Medicine

## 2018-05-17 ENCOUNTER — Encounter: Payer: Self-pay | Admitting: Family Medicine

## 2018-05-18 ENCOUNTER — Other Ambulatory Visit: Payer: Self-pay

## 2018-05-18 MED ORDER — OMEPRAZOLE 20 MG PO CPDR: 20.0000 mg | DELAYED_RELEASE_CAPSULE | Freq: Every day | ORAL | 1 refills | Status: DC

## 2018-05-18 MED ORDER — ALPRAZOLAM 0.25 MG PO TABS: 0.2500 mg | ORAL_TABLET | Freq: Three times a day (TID) | ORAL | 1 refills | Status: DC | PRN

## 2018-09-03 ENCOUNTER — Other Ambulatory Visit: Payer: Self-pay | Admitting: Family Medicine

## 2018-09-03 NOTE — Telephone Encounter (Signed)
See note

## 2018-09-03 NOTE — Telephone Encounter (Signed)
Copied from [place] [id number]. Topic: Quick Communication - Rx Refill/Question >> Sep 03, 2018 10:57 AM [name] wrote: Medication: bisoprolol (ZEBETA) 5 MG tablet  Has the patient contacted their pharmacy? Yes - states he needs a new script (Agent: If no, request that the patient contact the pharmacy for the refill.) (Agent: If yes, when and what did the pharmacy advise?)  Preferred Pharmacy (with phone number or street name):   [facility], [place] [facility] [address] [place] [phone] (Phone) [phone] (Fax)  Agent: Please be advised that RX refills may take up to 3 business days. We ask that you follow-up with your pharmacy.

## 2018-09-03 NOTE — Telephone Encounter (Signed)
Requested medication (s) are due for refill today: Yes  Requested medication (s) are on the active medication list: Yes  Last refill:  08/18/17  Future visit scheduled: Yes  Notes to clinic:  Unable to refill, expired Rx     Requested Prescriptions  Pending Prescriptions Disp Refills   bisoprolol (ZEBETA) 5 MG tablet 45 tablet 3    Sig: TAKE 1/2 TABLET(2.5 MG) BY MOUTH DAILY     Cardiovascular:  Beta Blockers Failed - 09/03/2018 11:56 AM      Failed - Last BP in normal range    BP Readings from Last 1 Encounters:  03/20/18 140/90         Failed - Valid encounter within last 6 months    Recent Outpatient Visits          5 months ago Gastroesophageal reflux disease without esophagitis   [facility], Brayton Mars, MD   9 months ago Palpitations   [place] [facility], Brayton Mars, MD   1 year ago Preventative health care   [place] [facility], Brayton Mars, MD   2 years ago Bacterial sinusitis   [facility] [place], Brayton Mars, MD   2 years ago Preventative health care   [facility] [place], Brayton Mars, MD      Future Appointments            In 1 month Yong Channel, Brayton Mars, MD [place] in normal range    Pulse Readings from Last 1 Encounters:  03/20/18 78

## 2018-09-04 MED ORDER — BISOPROLOL FUMARATE 5 MG PO TABS: ORAL_TABLET | ORAL | 3 refills | Status: DC

## 2018-10-16 ENCOUNTER — Telehealth: Payer: Self-pay | Admitting: Family Medicine

## 2018-10-16 NOTE — Telephone Encounter (Signed)
Can add Flonase over-the-counter-could have family member pick this up.  Cannot rule out covid-19 so would recommend he remain home for 14 days if at all possible.  If he has fever, shortness of breath-would definitely need to get testing.  For mild symptoms we are recommending home care though due to limited testing supplies at present.

## 2018-10-16 NOTE — Telephone Encounter (Signed)
pls see message and advise 

## 2018-10-16 NOTE — Telephone Encounter (Signed)
See note  Copied from [place] [phone]. Topic: General - Other >> Oct 16, 2018  3:06 PM Mcneil, Ja-Kwan wrote: Reason for CRM: Pt stated he has been taking Allegra and it seems to have helped with his cough however he complained that his throat is sore and he is experiencing drainage. Pt stated he does not have a fever and he just would like to know if Dr. Yong Channel suggests that he comes in for an appt or if a Rx for a medication can be called in. Attempted to schedule appt but pt would like to know what Dr. Yong Channel suggests him to do. Pt requests call back. Cb# [phone]

## 2018-10-17 MED ORDER — GUAIFENESIN-CODEINE 100-10 MG/5ML PO SOLN: 5.0000 mL | Freq: Four times a day (QID) | ORAL | 0 refills | Status: DC | PRN

## 2018-10-17 NOTE — Telephone Encounter (Signed)
Left message to return phone call.

## 2018-10-17 NOTE — Telephone Encounter (Signed)
Patient notified of results and verbalized understanding. Pt stated that he was doing the Flonase it was already prescribed and wants to know if he can do some tuccinex. If so, pt would like this called in. Pt stated he does feel a little better and think that it may have been allergies.

## 2018-10-17 NOTE — Telephone Encounter (Signed)
The last few times I have sent in Tussionex I was told it was not available anymore-I did send in codeine cough syrup for him

## 2018-10-17 NOTE — Addendum Note (Signed)
Addended by: Marin Olp on: 10/17/2018 06:06 PM   Modules accepted: Orders

## 2018-10-18 NOTE — Telephone Encounter (Signed)
I informed Pt that cough syrup has been sent in.  Pt aware.

## 2018-11-01 ENCOUNTER — Encounter: Payer: 59 | Admitting: Family Medicine

## 2018-11-06 ENCOUNTER — Other Ambulatory Visit: Payer: Self-pay

## 2018-11-06 ENCOUNTER — Other Ambulatory Visit: Payer: Self-pay | Admitting: Family Medicine

## 2018-11-06 NOTE — Telephone Encounter (Signed)
Created in error

## 2018-11-06 NOTE — Telephone Encounter (Deleted)
Rx sent in

## 2018-11-19 ENCOUNTER — Other Ambulatory Visit: Payer: Self-pay | Admitting: Family Medicine

## 2018-12-04 DIAGNOSIS — M25511 Pain in right shoulder: Secondary | ICD-10-CM | POA: Diagnosis not present

## 2019-03-25 NOTE — Progress Notes (Deleted)
delete

## 2019-03-25 NOTE — Patient Instructions (Addendum)
Health Maintenance Due  Topic Date Due  . INFLUENZA VACCINE  03/02/2019  will get at pharmacy later this year

## 2019-03-25 NOTE — Progress Notes (Signed)
Phone [phone]    Subjective:  Virtual visit via Video note. Chief complaint: Chief Complaint  Patient presents with  . Annual Exam   This visit type was conducted due to national recommendations for restrictions regarding the COVID-19 Pandemic (e.g. social distancing).  This format is felt to be most appropriate for this patient at this time balancing risks to patient and risks to population by having him in for in person visit.  No physical exam was performed (except for noted visual exam or audio findings with Telehealth visits).    Our team/I connected with Tyler [name] at  8:40 AM EDT by a video enabled telemedicine application (doxy.me or caregility through epic) and verified that I am speaking with the correct person using two identifiers.  Location patient: Home[facility] Location provider: [facility], office Persons participating in the virtual visit:  patient  Our team/I discussed the limitations of evaluation and management by telemedicine and the availability of in person appointments. In light of current covid-19 pandemic, patient also understands that we are trying to protect them by minimizing in office contact if at all possible.  The patient expressed consent for telemedicine visit and agreed to proceed. Patient understands insurance will be billed.   See problem oriented charting- ROS- full  review of systems was completed and negative except for: bleching/burping. No fever, chills, cough, congestion, runny nose, shortness of breath, fatigue, body aches, sore throat, headache, nausea, vomiting, diarrhea, or new loss of taste or smell. No known contacts with covid 19 or someone being tested for covid 19.   The following were reviewed and entered/updated in epic: Past Medical History:  Diagnosis Date  . Allergy   . Anxiety   . Asthma   . Cataract    beginning  . Colon polyps   . GERD (gastroesophageal reflux disease)   . Hematuria 10/05/2013  . Hemorrhoids   .  Hyperlipidemia   . IBS (irritable bowel syndrome)    alt diarrhea/ constipation  . Rosacea    Patient Active Problem List   Diagnosis Date Noted  . Palpitations 11/22/2017    Priority: Medium  . Memory difficulties 11/22/2017    Priority: Medium  . Essential hypertension 11/21/2014    Priority: Medium  . BPH (benign prostatic hyperplasia) 11/21/2014    Priority: Medium  . Irritable bowel syndrome 04/17/2008    Priority: Medium  . Anxiety state 12/27/2007    Priority: Medium  . Hyperlipidemia 10/26/2007    Priority: Medium  . Allergic rhinitis 11/21/2014    Priority: Low  . Former smoker 11/21/2014    Priority: Low  . Plantar fasciitis of left foot 10/22/2014    Priority: Low  . Metatarsalgia of both feet 09/26/2014    Priority: Low  . Hematuria 10/05/2013    Priority: Low  . External hemorrhoids 04/17/2008    Priority: Low  . History of colonic polyps 04/16/2008    Priority: Low  . Asthma 12/27/2007    Priority: Low  . GERD 12/27/2007    Priority: Low  . Rosacea 10/26/2007    Priority: Low   Past Surgical History:  Procedure Laterality Date  . COLONOSCOPY  07/28/2006    Family History  Problem Relation Age of Onset  . Glaucoma Mother        and father  . Coronary artery disease Mother        late 70s-cabg  . Breast cancer Mother   . Coronary artery disease Father 29  death 86, presumed due to MI as sudden  . Stomach cancer Maternal Uncle   . Colon cancer Neg Hx   . Colon polyps Neg Hx   . Esophageal cancer Neg Hx   . Rectal cancer Neg Hx     Medications- reviewed and updated Current Outpatient Medications  Medication Sig Dispense Refill  . ALPRAZolam (XANAX) 0.25 MG tablet Take 1 tablet (0.25 mg total) by mouth 3 (three) times daily as needed. 90 tablet 1  . bisoprolol (ZEBETA) 5 MG tablet TAKE 1/2 TABLET(2.5 MG) BY MOUTH DAILY 45 tablet 3  . diphenhydramine-acetaminophen (TYLENOL PM) 25-500 MG TABS tablet Take 1 tablet by mouth at bedtime.     . fluticasone (FLONASE) 50 MCG/ACT nasal spray Place 2 sprays into both nostrils daily. 16 g 6  . glycopyrrolate (ROBINUL) 2 MG tablet TAKE 1 TABLET(2 MG) BY MOUTH TWICE DAILY AS NEEDED 180 tablet 1  . hydrocortisone-pramoxine (ANALPRAM HC) 2.5-1 % rectal cream Place 1 application rectally 2 (two) times daily as needed for hemorrhoids or itching. 30 g 0  . mesalamine (CANASA) 1000 MG suppository Place 1 suppository (1,000 mg total) rectally at bedtime. 30 suppository 1  . Methylcellulose, Laxative, (CITRUCEL) 500 MG TABS Take 2 tablets by mouth 2 (two) times daily.     . multivitamin (THERAGRAN) per tablet Take 1 tablet by mouth daily.      . Omega-3 Fatty Acids (FISH OIL) 1000 MG CAPS Take 2 capsules by mouth daily.      Marland Kitchen omeprazole (PRILOSEC) 20 MG capsule TAKE 1 CAPSULE(20 MG) BY MOUTH DAILY 90 capsule 1  . Probiotic Product (PROBIOTIC DAILY PO) Take 1 capsule by mouth daily.    . rosuvastatin (CRESTOR) 20 MG tablet TAKE 1 TABLET BY MOUTH EVERY WEEK 14 tablet 1  . saw palmetto 160 MG capsule Take 160 mg by mouth daily.     No current facility-administered medications for this visit.     Allergies-reviewed and updated Allergies  Allergen Reactions  . Penicillins    Social History   Social History Narrative   Married (wife plans to transition to me 2020, see mother in Sports coach as well [place]), 3 kids. 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 1 of his children is adopted.       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       Hobbies: biking, cycling, reading, movies   Objective  Objective:  BP 129/82   Pulse 76   Temp (!) 97.5 F (36.4 C)   Resp 16   Ht 5\' 8"  (1.727 m)   Wt 150 lb (68 kg)   BMI 22.81 kg/m   home reported #s Constitutional: Conversant, no acute distress Eyes: Anicteric sclera; no lid lag or proptosis Respiratory: Normal respiratory effort,  respiratory rate 16 Cardiovascular: No pitting peripheral edema.   Skin: No rash, lesions or ulcers Musculoskeletal: No digital cyanosis.  Normal gait. Neurological: Normal speech, moves upper extremities without difficulty Psych: Intact judgment and insight.  Alert and oriented x3 with friendly affect.    Assessment and Plan  63 y.o. male presenting for annual physical.  Health Maintenance counseling: 1. Anticipatory guidance: Patient counseled regarding regular dental exams -primarily yearly, eye exams -yearly or so (considering cataract surgery- pushing to wait for medicare but may not happen),  avoiding smoking and second hand smoke , limiting alcohol to 2 beverages per day - about  4 days a week maxing out at 2, usually 1.   2. Risk factor reduction:  Advised patient of need for regular exercise and diet rich and fruits and vegetables to reduce risk of heart attack and stroke. Exercise- from 30-45 miles of biking per week one day a week and then walking 3-4 days a week as well for 2 miles. Diet-reasonably clean- has tried to increase veggies. Has substituted fruits for snacking. .  Wt Readings from Last 3 Encounters:  03/26/19 150 lb (68 kg)  03/20/18 156 lb 4 oz (70.9 kg)  11/22/17 156 lb 3.2 oz (70.9 kg)  3. Immunizations/screenings/ancillary studies- advised fall flu shot- will get at pharmacy and let us know. Also discussed shingrix - defer for now.  Immunization History  Administered Date(s) Administered  . Influenza,inj,Quad PF,6+ Mos 04/22/2013, 08/18/2017  . Td 10/26/2007  . Tdap 12/18/2017  4. Prostate cancer screening-psa trended up some last year- repeat at this time. BPH on prior exams. Patient is sleeping 6-7 hours at night without peeing but sometimes in the day feels like start/stop of stream and feels like urinates more.  Lab Results  Component Value Date   PSA 2.05 08/18/2017   PSA 1.68 03/03/2016   PSA 1.70 11/21/2014   5. Colon cancer screening - 05/27/16 with 10  year repeat 6. Skin cancer screening- seeing Dr. Pearline Cables of dermatology. advised regular sunscreen use. Denies worrisome, changing, or new skin lesions. Follows for psoriasis on scalp. 7. former smoker- only 3 pack years. AAA screen planned next year (when turns 75) . Get UA.   Status of chronic or acute concerns  Hypertension - Controlled on Bisoprolol 2.5 mg daily. Has white coat hypertension- not in office today though, higher in office, home readings 120's/80's.   Anxiety - Triggered by flying, doctors visits, and public speaking. Takes Alprazolam 0.25 mg prn. Refill not needed - has not been flying as much.   Hyperlipidemia - Taking Crestor 20 mg weekly and Omega 3. Normal Lipid Panel 08/18/2017. Update lipid panel at this time   Gastrointestinal            IBS, mixed - Taking Robinul 2 mg BID and Probiotics. Off and on issues           Hemorrhoids - Followed by GI. No improvement with Analpram HC in the past.  Canasa per GI helpful in past.  Currently-  Using Mesalamine 10000 mg  suppository per GI if needed- has not needed in a year.            GERD - Taking Omeprazole 20 mg daily. Does have a lot of burping/belching still but severe heartburn improved. Could try gas x prn. Also discussed possibly updating b12. Could try pepcid/famotidine 10-20 mg BID  Seasonal Allergies - allegra when needed  Low back pain- following with Dr. Noemi Chapel- on celebrex . Discussed cv and kidney risk- we will update cmp.   Recommended follow up: 1 year physical as long as checking BP regularly at least a few times a month and as long as bloodwork is ok.   Lab/Order associations: will call to schedule lab visit - will do fasting    ICD-10-CM   1. Preventative health care  Z00.00 CBC    Comprehensive metabolic panel    Lipid panel    PSA    Vitamin B12    POCT Urinalysis Dipstick (Automated)  2. Essential hypertension  I10 CBC    Comprehensive metabolic panel    Lipid panel  POCT Urinalysis Dipstick  (Automated)  3. Hyperlipidemia, unspecified hyperlipidemia type  E78.5 CBC    Comprehensive metabolic panel    Lipid panel    POCT Urinalysis Dipstick (Automated)  4. Irritable bowel syndrome with both constipation and diarrhea  K58.2   5. Benign prostatic hyperplasia with nocturia  N40.1 PSA   R35.1   6. Screening for prostate cancer  Z12.5 PSA  7. High risk medication use  Z79.899 Vitamin B12   Return precautions advised.  Garret Reddish, MD

## 2019-03-26 ENCOUNTER — Encounter: Payer: Self-pay | Admitting: Family Medicine

## 2019-03-26 ENCOUNTER — Ambulatory Visit (INDEPENDENT_AMBULATORY_CARE_PROVIDER_SITE_OTHER): Payer: 59 | Admitting: Family Medicine

## 2019-03-26 VITALS — BP 129/82 | HR 76 | Temp 97.5°F | Resp 16 | Ht 68.0 in | Wt 150.0 lb

## 2019-03-26 DIAGNOSIS — Z0000 Encounter for general adult medical examination without abnormal findings: Secondary | ICD-10-CM

## 2019-03-26 DIAGNOSIS — E785 Hyperlipidemia, unspecified: Secondary | ICD-10-CM | POA: Diagnosis not present

## 2019-03-26 DIAGNOSIS — I10 Essential (primary) hypertension: Secondary | ICD-10-CM

## 2019-03-26 DIAGNOSIS — Z125 Encounter for screening for malignant neoplasm of prostate: Secondary | ICD-10-CM

## 2019-03-26 DIAGNOSIS — N401 Enlarged prostate with lower urinary tract symptoms: Secondary | ICD-10-CM

## 2019-03-26 DIAGNOSIS — R351 Nocturia: Secondary | ICD-10-CM

## 2019-03-26 DIAGNOSIS — K582 Mixed irritable bowel syndrome: Secondary | ICD-10-CM

## 2019-03-26 DIAGNOSIS — Z79899 Other long term (current) drug therapy: Secondary | ICD-10-CM

## 2019-03-28 ENCOUNTER — Other Ambulatory Visit: Payer: Self-pay

## 2019-03-28 ENCOUNTER — Other Ambulatory Visit (INDEPENDENT_AMBULATORY_CARE_PROVIDER_SITE_OTHER): Payer: 59

## 2019-03-28 DIAGNOSIS — Z125 Encounter for screening for malignant neoplasm of prostate: Secondary | ICD-10-CM

## 2019-03-28 DIAGNOSIS — E785 Hyperlipidemia, unspecified: Secondary | ICD-10-CM | POA: Diagnosis not present

## 2019-03-28 DIAGNOSIS — Z0000 Encounter for general adult medical examination without abnormal findings: Secondary | ICD-10-CM | POA: Diagnosis not present

## 2019-03-28 DIAGNOSIS — R351 Nocturia: Secondary | ICD-10-CM | POA: Diagnosis not present

## 2019-03-28 DIAGNOSIS — I10 Essential (primary) hypertension: Secondary | ICD-10-CM | POA: Diagnosis not present

## 2019-03-28 DIAGNOSIS — Z79899 Other long term (current) drug therapy: Secondary | ICD-10-CM | POA: Diagnosis not present

## 2019-03-28 DIAGNOSIS — N401 Enlarged prostate with lower urinary tract symptoms: Secondary | ICD-10-CM | POA: Diagnosis not present

## 2019-03-28 LAB — COMPREHENSIVE METABOLIC PANEL
ALT: 14 U/L (ref 0–53)
AST: 15 U/L (ref 0–37)
Albumin: 4.4 g/dL (ref 3.5–5.2)
Alkaline Phosphatase: 46 U/L (ref 39–117)
BUN: 22 mg/dL (ref 6–23)
CO2: 25 mEq/L (ref 19–32)
Calcium: 9.3 mg/dL (ref 8.4–10.5)
Chloride: 103 mEq/L (ref 96–112)
Creatinine, Ser: 1.04 mg/dL (ref 0.40–1.50)
GFR: 71.85 mL/min (ref >60.00)
Glucose, Bld: 101 mg/dL — ABNORMAL HIGH (ref 70–99)
Potassium: 4.2 mEq/L (ref 3.5–5.1)
Sodium: 136 mEq/L (ref 135–145)
Total Bilirubin: 0.6 mg/dL (ref 0.2–1.2)
Total Protein: 6.6 g/dL (ref 6.0–8.3)

## 2019-03-28 LAB — POC URINALSYSI DIPSTICK (AUTOMATED)
Bilirubin, UA: NEGATIVE
Blood, UA: NEGATIVE
Glucose, UA: NEGATIVE
Ketones, UA: NEGATIVE
Leukocytes, UA: NEGATIVE
Nitrite, UA: NEGATIVE
Protein, UA: NEGATIVE
Spec Grav, UA: 1.02 (ref 1.010–1.025)
Urobilinogen, UA: 0.2 E.U./dL
pH, UA: 6 (ref 5.0–8.0)

## 2019-03-28 LAB — PSA: PSA: 3.06 ng/mL (ref 0.10–4.00)

## 2019-03-28 LAB — LIPID PANEL
Cholesterol: 137 mg/dL (ref 0–200)
HDL: 49.5 mg/dL (ref >39.00)
LDL Cholesterol: 69 mg/dL (ref 0–99)
NonHDL: 87.7
Total CHOL/HDL Ratio: 3
Triglycerides: 96 mg/dL (ref 0.0–149.0)
VLDL: 19.2 mg/dL (ref 0.0–40.0)

## 2019-03-28 LAB — CBC
HCT: 48.8 % (ref 39.0–52.0)
Hemoglobin: 16.4 g/dL (ref 13.0–17.0)
MCHC: 33.6 g/dL (ref 30.0–36.0)
MCV: 88.9 fl (ref 78.0–100.0)
Platelets: 257 10*3/uL (ref 150.0–400.0)
RBC: 5.5 Mil/uL (ref 4.22–5.81)
RDW: 13.9 % (ref 11.5–15.5)
WBC: 7.5 10*3/uL (ref 4.0–10.5)

## 2019-03-28 LAB — VITAMIN B12: Vitamin B-12: 298 pg/mL (ref 211–911)

## 2019-04-02 ENCOUNTER — Other Ambulatory Visit: Payer: Self-pay

## 2019-04-02 DIAGNOSIS — R972 Elevated prostate specific antigen [PSA]: Secondary | ICD-10-CM

## 2019-04-24 ENCOUNTER — Encounter: Payer: Self-pay | Admitting: Family Medicine

## 2019-04-24 ENCOUNTER — Other Ambulatory Visit (INDEPENDENT_AMBULATORY_CARE_PROVIDER_SITE_OTHER): Payer: 59

## 2019-04-24 ENCOUNTER — Other Ambulatory Visit: Payer: Self-pay

## 2019-04-24 DIAGNOSIS — R972 Elevated prostate specific antigen [PSA]: Secondary | ICD-10-CM

## 2019-04-24 LAB — PSA: PSA: 2.89 ng/mL (ref 0.10–4.00)

## 2019-05-16 ENCOUNTER — Other Ambulatory Visit: Payer: Self-pay | Admitting: Family Medicine

## 2019-05-18 ENCOUNTER — Other Ambulatory Visit: Payer: Self-pay | Admitting: Family Medicine

## 2019-08-14 ENCOUNTER — Other Ambulatory Visit: Payer: Self-pay | Admitting: Family Medicine

## 2019-10-23 ENCOUNTER — Encounter: Payer: Self-pay | Admitting: Family Medicine

## 2019-10-29 ENCOUNTER — Other Ambulatory Visit: Payer: Self-pay

## 2019-10-29 MED ORDER — HYDROCORTISONE ACE-PRAMOXINE 2.5-1 % EX CREA: 1.0000 "application " | TOPICAL_CREAM | Freq: Two times a day (BID) | CUTANEOUS | 0 refills | Status: DC | PRN

## 2019-10-29 NOTE — Telephone Encounter (Signed)
Refill has been sent to the patient's pharmacy.  

## 2019-11-12 ENCOUNTER — Other Ambulatory Visit: Payer: Self-pay | Admitting: Family Medicine

## 2019-11-27 ENCOUNTER — Other Ambulatory Visit: Payer: Self-pay | Admitting: Family Medicine

## 2019-11-29 ENCOUNTER — Other Ambulatory Visit: Payer: Self-pay

## 2019-11-29 MED ORDER — OMEPRAZOLE 20 MG PO CPDR: DELAYED_RELEASE_CAPSULE | ORAL | 1 refills | Status: DC

## 2019-12-03 ENCOUNTER — Encounter: Payer: Self-pay | Admitting: Family Medicine

## 2020-02-10 ENCOUNTER — Other Ambulatory Visit: Payer: Self-pay | Admitting: Family Medicine

## 2020-02-26 ENCOUNTER — Telehealth: Payer: Self-pay

## 2020-02-26 NOTE — Telephone Encounter (Signed)
Pt needs ov, last appointment was 03/26/2019.

## 2020-02-26 NOTE — Telephone Encounter (Signed)
..   LAST APPOINTMENT DATE: 02/10/2020   NEXT APPOINTMENT DATE:@Visit  date not found  MEDICATION:ALPRAZolam (XANAX) 0.25 MG tablet    PHARMACY:[id number] - , [place] - [address] AT [address]  **Let patient know to contact pharmacy at the end of the day to make sure medication is ready. **  ** Please notify patient to allow 48-72 hours to process**  **Encourage patient to contact the pharmacy for refills or they can request refills through [facility]**  CLINICAL FILLS OUT ALL BELOW:   LAST REFILL:  QTY:  REFILL DATE:    OTHER COMMENTS:  Needs for travel  Okay for refill?  Please advise

## 2020-02-27 ENCOUNTER — Telehealth: Payer: Self-pay | Admitting: *Deleted

## 2020-02-27 ENCOUNTER — Telehealth (INDEPENDENT_AMBULATORY_CARE_PROVIDER_SITE_OTHER): Payer: Medicare Other | Admitting: Family Medicine

## 2020-02-27 ENCOUNTER — Other Ambulatory Visit: Payer: Self-pay

## 2020-02-27 DIAGNOSIS — F410 Panic disorder [episodic paroxysmal anxiety] without agoraphobia: Secondary | ICD-10-CM | POA: Diagnosis not present

## 2020-02-27 MED ORDER — ALPRAZOLAM 0.25 MG PO TABS
0.2500 mg | ORAL_TABLET | Freq: Two times a day (BID) | ORAL | 0 refills | Status: DC | PRN
Start: 2020-02-27 — End: 2020-09-15

## 2020-02-27 NOTE — Progress Notes (Signed)
Virtual Visit via Video Note  I connected with Xzavien  on 02/27/20 at 12:00 PM EDT by a video enabled telemedicine application and verified that I am speaking with the correct person using two identifiers.  Location patient: home Location provider:work or home office Persons participating in the virtual visit: patient, provider  I discussed the limitations of evaluation and management by telemedicine and the availability of in person appointments. The patient expressed understanding and agreed to proceed.   HPI:  Acute visit for Panic Disorder: -per patient and PCP notes uses alprazolam rarely for flying/travel -has not required refill in several years as reports rarely uses -denies any depression, anxiety or mood disorder o/w -was not able to get appt with PCP, but has yearly CPE scheduled per his report    ROS: See pertinent positives and negatives per HPI.  Past Medical History:  Diagnosis Date   Allergy    Anxiety    Asthma    Cataract    beginning   Colon polyps    GERD (gastroesophageal reflux disease)    Hematuria 10/05/2013   Hemorrhoids    Hyperlipidemia    IBS (irritable bowel syndrome)    alt diarrhea/ constipation   Rosacea     Past Surgical History:  Procedure Laterality Date   COLONOSCOPY  07/28/2006    Family History  Problem Relation Age of Onset   Glaucoma Mother        and father   Coronary artery disease Mother        late 80s-cabg. died at 34   Breast cancer Mother    Coronary artery disease Father 87       death 8, presumed due to MI as sudden   Stomach cancer Maternal Uncle    Colon cancer Neg Hx    Colon polyps Neg Hx    Esophageal cancer Neg Hx    Rectal cancer Neg Hx     SOCIAL HX: see hpi   Current Outpatient Medications:    ALPRAZolam (XANAX) 0.25 MG tablet, Take 1 tablet (0.25 mg total) by mouth 2 (two) times daily as needed for anxiety., Disp: 20 tablet, Rfl: 0   bisoprolol (ZEBETA) 5 MG tablet, TAKE  1/2 TABLET(2.5 MG) BY MOUTH DAILY, Disp: 45 tablet, Rfl: 0   diphenhydramine-acetaminophen (TYLENOL PM) 25-500 MG TABS tablet, Take 1 tablet by mouth at bedtime., Disp: , Rfl:    fluocinonide (LIDEX) 0.05 % external solution, SMARTSIG:1 Milliliter(s) Topical Twice Daily, Disp: , Rfl:    fluticasone (FLONASE) 50 MCG/ACT nasal spray, Place 2 sprays into both nostrils daily., Disp: 16 g, Rfl: 6   glycopyrrolate (ROBINUL) 2 MG tablet, TAKE 1 TABLET(2 MG) BY MOUTH TWICE DAILY AS NEEDED, Disp: 180 tablet, Rfl: 1   hydrocortisone-pramoxine (ANALPRAM HC) 2.5-1 % rectal cream, Place 1 application rectally 2 (two) times daily as needed for hemorrhoids or itching., Disp: 30 g, Rfl: 0   meloxicam (MOBIC) 15 MG tablet, Take 15 mg by mouth daily., Disp: , Rfl:    mesalamine (CANASA) 1000 MG suppository, Place 1 suppository (1,000 mg total) rectally at bedtime., Disp: 30 suppository, Rfl: 1   Methylcellulose, Laxative, (CITRUCEL) 500 MG TABS, Take 2 tablets by mouth 2 (two) times daily. , Disp: , Rfl:    multivitamin (THERAGRAN) per tablet, Take 1 tablet by mouth daily.  , Disp: , Rfl:    Omega-3 Fatty Acids (FISH OIL) 1000 MG CAPS, Take 2 capsules by mouth daily.  , Disp: , Rfl:    omeprazole (  PRILOSEC) 20 MG capsule, TAKE 1 CAPSULE(20 MG) BY MOUTH DAILY, Disp: 90 capsule, Rfl: 1   Pramoxine-HC (HYDROCORTISONE ACE-PRAMOXINE) 2.5-1 % CREA, Apply 1 application topically 2 (two) times daily as needed., Disp: 30 g, Rfl: 0   Probiotic Product (PROBIOTIC DAILY PO), Take 1 capsule by mouth daily., Disp: , Rfl:    rosuvastatin (CRESTOR) 20 MG tablet, TAKE 1 TABLET BY MOUTH EVERY WEEK, Disp: 14 tablet, Rfl: 1   saw palmetto 160 MG capsule, Take 160 mg by mouth daily., Disp: , Rfl:    tamsulosin (FLOMAX) 0.4 MG CAPS capsule, Take 0.4 mg by mouth daily., Disp: , Rfl:   EXAM:  VITALS per patient if applicable:  GENERAL: alert, oriented, appears well and in no acute distress  HEENT: atraumatic,  conjunttiva clear, no obvious abnormalities on inspection of external nose and ears  NECK: normal movements of the head and neck  LUNGS: on inspection no signs of respiratory distress, breathing rate appears normal, no obvious gross SOB, gasping or wheezing  CV: no obvious cyanosis  MS: moves all visible extremities without noticeable abnormality  PSYCH/NEURO: pleasant and cooperative, no obvious depression or anxiety, speech and thought processing grossly intact  ASSESSMENT AND PLAN:  Discussed the following assessment and plan:  Panic disorder  -we discussed possible serious and likely etiologies, options for evaluation and workup, limitations of telemedicine visit vs in person visit, treatment, treatment risks and precautions. Pt prefers to treat via telemedicine empirically rather then risking or undertaking an in person visit at this moment. Usually, I do not prescribe controlled substances via video visits. However, given good documentation by PCP of appropriate use in the past, and patient is leaving on a 7 days trip and will be flying quite a bit, agreed to small amount of refill on his alprazolam. Reviewed risks in detail. Advised future refills come from PCP and copied PCP on notes.     [facility] Rx system not working, sent message to assistant: Wendie Simmer, Trying to send alprazolam 0.25mg  bid prn anxiety, #20 no refills to the pharmacy for [facility], 06/08/1956. My [facility] seems to not be working. I think I printed the Rx - could you please fax or phone to the pharmacy for me? Thank you!   I discussed the assessment and treatment plan with the patient. The patient was provided an opportunity to ask questions and all were answered. The patient agreed with the plan and demonstrated an understanding of the instructions.   The patient was advised to call back or seek an in-person evaluation if the symptoms worsen or if the condition fails to improve as anticipated.   Lucretia Kern, DO

## 2020-02-27 NOTE — Telephone Encounter (Signed)
Thanks! I think Wendie Simmer called it in.

## 2020-02-27 NOTE — Telephone Encounter (Signed)
See prior phone note-Rx phoned to the pharmacy.

## 2020-02-27 NOTE — Telephone Encounter (Signed)
Per Dr Maudie Mercury the Rx for Alprazolam could not be sent electronically as the system was down.  I phoned in the Rx to the pharmacist Truman Hayward at [facility].

## 2020-02-27 NOTE — Telephone Encounter (Signed)
-----   Message from Marin Olp, MD sent at 02/27/2020  3:13 PM EDT ----- Marykay Lex im happy to send this in with [facility] if Mechele Claude has issues- just let me know! Thanks for seeing him ----- Message ----- From: Lucretia Kern, DO Sent: 02/27/2020  12:56 PM EDT To: Marin Olp, MD

## 2020-02-27 NOTE — Patient Instructions (Signed)
Alprazolam tablets What is this medicine? ALPRAZOLAM (al PRAY zoe lam) is a benzodiazepine. It is used to treat anxiety and panic attacks. This medicine may be used for other purposes; ask your health care provider or pharmacist if you have questions. COMMON BRAND NAME(S): Xanax What should I tell my health care provider before I take this medicine? They need to know if you have any of these conditions:  an alcohol or drug abuse problem  bipolar disorder, depression, psychosis or other mental health conditions  glaucoma  kidney or liver disease  lung or breathing disease  myasthenia gravis  Parkinson's disease  porphyria  seizures or a history of seizures  suicidal thoughts  an unusual or allergic reaction to alprazolam, other benzodiazepines, foods, dyes, or preservatives  pregnant or trying to get pregnant  breast-feeding How should I use this medicine? Take this medicine by mouth with a glass of water. Follow the directions on the prescription label. Take your medicine at regular intervals. Do not take it more often than directed. Do not stop taking except on your doctor's advice. A special MedGuide will be given to you by the pharmacist with each prescription and refill. Be sure to read this information carefully each time. Talk to your pediatrician regarding the use of this medicine in children. Special care may be needed. Overdosage: If you think you have taken too much of this medicine contact a poison control center or emergency room at once. NOTE: This medicine is only for you. Do not share this medicine with others. What if I miss a dose? If you miss a dose, take it as soon as you can. If it is almost time for your next dose, take only that dose. Do not take double or extra doses. What may interact with this medicine? Do not take this medicine with any of the following medications:  certain antiviral medicines for HIV or AIDS like delavirdine,  indinavir  certain medicines for fungal infections like ketoconazole and itraconazole  narcotic medicines for cough  sodium oxybate This medicine may also interact with the following medications:  alcohol  antihistamines for allergy, cough and cold  certain antibiotics like clarithromycin, erythromycin, isoniazid, rifampin, rifapentine, rifabutin, and troleandomycin  certain medicines for blood pressure, heart disease, irregular heart beat  certain medicines for depression, like amitriptyline, fluoxetine, sertraline  certain medicines for seizures like carbamazepine, oxcarbazepine, phenobarbital, phenytoin, primidone  cimetidine  cyclosporine  male hormones, like estrogens or progestins and birth control pills, patches, rings, or injections  general anesthetics like halothane, isoflurane, methoxyflurane, propofol  grapefruit juice  local anesthetics like lidocaine, pramoxine, tetracaine  medicines that relax muscles for surgery  narcotic medicines for pain  other antiviral medicines for HIV or AIDS  phenothiazines like chlorpromazine, mesoridazine, prochlorperazine, thioridazine This list may not describe all possible interactions. Give your health care provider a list of all the medicines, herbs, non-prescription drugs, or dietary supplements you use. Also tell them if you smoke, drink alcohol, or use illegal drugs. Some items may interact with your medicine. What should I watch for while using this medicine? Tell your doctor or health care professional if your symptoms do not start to get better or if they get worse. Do not stop taking except on your doctor's advice. You may develop a severe reaction. Your doctor will tell you how much medicine to take. You may get drowsy or dizzy. Do not drive, use machinery, or do anything that needs mental alertness until you know how this medicine affects  you. To reduce the risk of dizzy and fainting spells, do not stand or sit up  quickly, especially if you are an older patient. Alcohol may increase dizziness and drowsiness. Avoid alcoholic drinks. If you are taking another medicine that also causes drowsiness, you may have more side effects. Give your health care provider a list of all medicines you use. Your doctor will tell you how much medicine to take. Do not take more medicine than directed. Call emergency for help if you have problems breathing or unusual sleepiness. What side effects may I notice from receiving this medicine? Side effects that you should report to your doctor or health care professional as soon as possible:  allergic reactions like skin rash, itching or hives, swelling of the face, lips, or tongue  breathing problems  confusion  loss of balance or coordination  signs and symptoms of low blood pressure like dizziness; feeling faint or lightheaded, falls; unusually weak or tired  suicidal thoughts or other mood changes Side effects that usually do not require medical attention (report to your doctor or health care professional if they continue or are bothersome):  dizziness  dry mouth  nausea, vomiting  tiredness This list may not describe all possible side effects. Call your doctor for medical advice about side effects. You may report side effects to FDA at 1-800-FDA-1088. Where should I keep my medicine? Keep out of the reach of children. This medicine can be abused. Keep your medicine in a safe place to protect it from theft. Do not share this medicine with anyone. Selling or giving away this medicine is dangerous and against the law. Store at room temperature between 20 and 25 degrees C (68 and 77 degrees F). This medicine may cause accidental overdose and death if taken by other adults, children, or pets. Mix any unused medicine with a substance like cat litter or coffee grounds. Then throw the medicine away in a sealed container like a sealed bag or a coffee can with a lid. Do not use  the medicine after the expiration date. NOTE: This sheet is a summary. It may not cover all possible information. If you have questions about this medicine, talk to your doctor, pharmacist, or health care provider.  2020 Elsevier/Gold Standard (2015-04-16 13:47:25)

## 2020-04-30 ENCOUNTER — Other Ambulatory Visit: Payer: Self-pay

## 2020-04-30 MED ORDER — BISOPROLOL FUMARATE 5 MG PO TABS: ORAL_TABLET | ORAL | 3 refills | Status: DC

## 2020-05-10 ENCOUNTER — Other Ambulatory Visit: Payer: Self-pay | Admitting: Family Medicine

## 2020-05-12 ENCOUNTER — Telehealth: Payer: Self-pay

## 2020-05-12 MED ORDER — OMEPRAZOLE 20 MG PO CPDR: 20.0000 mg | DELAYED_RELEASE_CAPSULE | Freq: Two times a day (BID) | ORAL | 1 refills | Status: DC

## 2020-05-12 NOTE — Telephone Encounter (Signed)
Refill sent in

## 2020-05-12 NOTE — Telephone Encounter (Signed)
. °  LAST APPOINTMENT DATE: 05/10/2020   NEXT APPOINTMENT DATE:@1 /05/2021  MEDICATION:omeprazole (PRILOSEC) 20 MG capsule  PHARMACY:[facility] [place], [place]  **Let patient know to contact pharmacy at the end of the day to make sure medication is ready. **  ** Please notify patient to allow 48-72 hours to process**  **Encourage patient to contact the pharmacy for refills or they can request refills through [facility]**  CLINICAL FILLS OUT ALL BELOW:   LAST REFILL:  QTY:  REFILL DATE:    OTHER COMMENTS:    Okay for refill?  Please advise

## 2020-05-15 ENCOUNTER — Other Ambulatory Visit: Payer: Self-pay

## 2020-05-15 MED ORDER — BISOPROLOL FUMARATE 5 MG PO TABS: ORAL_TABLET | ORAL | 3 refills | Status: DC

## 2020-05-15 NOTE — Addendum Note (Signed)
Addended by: Thomes Cake on: 05/15/2020 01:52 PM   Modules accepted: Orders

## 2020-05-15 NOTE — Telephone Encounter (Signed)
  LAST APPOINTMENT DATE: 04/30/2020   NEXT APPOINTMENT DATE:@10 /07/2020  MEDICATION: bisoprolol (ZEBETA) 5 MG tablet  PHARMACY: [facility], [place] Phone:  [phone]        COMMENTS: Patient is needing this today, as he will be out by the weekend. It was originally sent to the wrong pharmacy    Please advise

## 2020-05-15 NOTE — Telephone Encounter (Signed)
Medication has been sent to the correct pharmacy.

## 2020-05-18 ENCOUNTER — Telehealth: Payer: Self-pay

## 2020-05-18 MED ORDER — OMEPRAZOLE 20 MG PO CPDR
20.0000 mg | DELAYED_RELEASE_CAPSULE | Freq: Two times a day (BID) | ORAL | 1 refills | Status: DC
Start: 2020-05-18 — End: 2020-09-15

## 2020-05-18 NOTE — Telephone Encounter (Signed)
Medication resent to [facility].

## 2020-05-18 NOTE — Telephone Encounter (Signed)
Patient is calling in stating that he [facility] still has not havent received his omeprazole (PRILOSEC) 20 MG capsule, states they havent had any response from the office. Did have patient call [facility] again just to verify.

## 2020-05-24 ENCOUNTER — Encounter: Payer: Self-pay | Admitting: Family Medicine

## 2020-06-08 ENCOUNTER — Other Ambulatory Visit: Payer: Self-pay

## 2020-06-08 MED ORDER — ROSUVASTATIN CALCIUM 20 MG PO TABS
20.0000 mg | ORAL_TABLET | ORAL | 1 refills | Status: DC
Start: 2020-06-08 — End: 2020-10-19

## 2020-08-07 ENCOUNTER — Other Ambulatory Visit: Payer: Medicare Other

## 2020-08-10 ENCOUNTER — Encounter: Payer: 59 | Admitting: Family Medicine

## 2020-09-12 ENCOUNTER — Emergency Department (HOSPITAL_BASED_OUTPATIENT_CLINIC_OR_DEPARTMENT_OTHER)
Admission: EM | Admit: 2020-09-12 | Discharge: 2020-09-12 | Disposition: A | Discharge status: Home / Self Care | Payer: Medicare Other | Attending: Emergency Medicine | Admitting: Emergency Medicine

## 2020-09-12 ENCOUNTER — Emergency Department (HOSPITAL_BASED_OUTPATIENT_CLINIC_OR_DEPARTMENT_OTHER): Payer: Medicare Other

## 2020-09-12 ENCOUNTER — Other Ambulatory Visit: Payer: Self-pay

## 2020-09-12 ENCOUNTER — Encounter (HOSPITAL_BASED_OUTPATIENT_CLINIC_OR_DEPARTMENT_OTHER): Payer: Self-pay | Admitting: Emergency Medicine

## 2020-09-12 DIAGNOSIS — Z7951 Long term (current) use of inhaled steroids: Secondary | ICD-10-CM | POA: Insufficient documentation

## 2020-09-12 DIAGNOSIS — R55 Syncope and collapse: Secondary | ICD-10-CM | POA: Diagnosis not present

## 2020-09-12 DIAGNOSIS — J45909 Unspecified asthma, uncomplicated: Secondary | ICD-10-CM | POA: Insufficient documentation

## 2020-09-12 DIAGNOSIS — I10 Essential (primary) hypertension: Secondary | ICD-10-CM | POA: Insufficient documentation

## 2020-09-12 DIAGNOSIS — Z87891 Personal history of nicotine dependence: Secondary | ICD-10-CM | POA: Insufficient documentation

## 2020-09-12 DIAGNOSIS — W1839XA Other fall on same level, initial encounter: Secondary | ICD-10-CM | POA: Diagnosis not present

## 2020-09-12 DIAGNOSIS — Z79899 Other long term (current) drug therapy: Secondary | ICD-10-CM | POA: Insufficient documentation

## 2020-09-12 DIAGNOSIS — R42 Dizziness and giddiness: Secondary | ICD-10-CM | POA: Insufficient documentation

## 2020-09-12 LAB — CBC
HCT: 50.8 % (ref 39.0–52.0)
Hemoglobin: 17.5 g/dL — ABNORMAL HIGH (ref 13.0–17.0)
MCH: 29.4 pg (ref 26.0–34.0)
MCHC: 34.4 g/dL (ref 30.0–36.0)
MCV: 85.2 fL (ref 80.0–100.0)
Platelets: 284 10*3/uL (ref 150–400)
RBC: 5.96 MIL/uL — ABNORMAL HIGH (ref 4.22–5.81)
RDW: 13.3 % (ref 11.5–15.5)
WBC: 14.9 10*3/uL — ABNORMAL HIGH (ref 4.0–10.5)
nRBC: 0 % (ref 0.0–0.2)

## 2020-09-12 LAB — URINALYSIS, ROUTINE W REFLEX MICROSCOPIC
Bilirubin Urine: NEGATIVE
Glucose, UA: NEGATIVE mg/dL
Ketones, ur: NEGATIVE mg/dL
Leukocytes,Ua: NEGATIVE
Nitrite: NEGATIVE
Protein, ur: NEGATIVE mg/dL
Specific Gravity, Urine: 1.01 (ref 1.005–1.030)
pH: 6.5 (ref 5.0–8.0)

## 2020-09-12 LAB — URINALYSIS, MICROSCOPIC (REFLEX)

## 2020-09-12 LAB — CBG MONITORING, ED: Glucose-Capillary: 89 mg/dL (ref 70–99)
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
## 2020-09-12 NOTE — ED Notes (Signed)
CBG 89 
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
## 2020-09-14 NOTE — Patient Instructions (Addendum)
Health Maintenance Due  Topic Date Due  . Pneumovax 23 today Never done  . COVID-19 Vaccine (3 - Booster for [employer] series)- please send [place] the date when you find this 03/10/2020   Team please complete  vision screening  -I don't strongly suspect arrhythmia considered monitor but we opted to only pursue if recurrent issues. Same with echocardiogram.  - discussed building up on exercise, staying hydrated, definitely letting us know ASAP if any recurrent issues, careful with position changes  We will call you within two weeks about your referral for AAA scan. If you do not hear within 2 weeks, give [place] a call.   Please check with your pharmacy to see if they have the shingrix vaccine. If they do- please get this immunization and update [place] by phone call or mychart with dates you receive the vaccine  Please stop by lab before you go If you have mychart- we will send your results within 3 business days of [place] receiving them.  If you do not have mychart- we will call you about results within 5 business days of [place] receiving them.  *please also note that you will see labs on mychart as soon as they post. I will later go in and write notes on them- will say "notes from Dr. Yong Channel"     Mr. Bellew , Thank you for taking time to come for your Medicare Wellness Visit. I appreciate your ongoing commitment to your health goals. Please review the following plan we discussed and let me know if I can assist you in the future.   These are the goals we discussed: 1. Work back up to 150 minutes a week exercise 2. Focus on whole foods plant based diets   This is a list of the screening recommended for you and due dates:  Health Maintenance  Topic Date Due  . Pneumonia vaccines (1 of 2 - PCV13) Never done  . COVID-19 Vaccine (3 - Booster for Pfizer series) 03/10/2020  . Colon Cancer Screening  05/27/2026  . Tetanus Vaccine  12/19/2027  . Flu Shot  Completed  .  Hepatitis C: One time screening is  recommended by Center for Disease Control  (CDC) for  adults born from [birth year] through 1966.   Completed  . HIV Screening  Completed

## 2020-09-15 ENCOUNTER — Encounter: Payer: Self-pay | Admitting: Family Medicine

## 2020-09-15 ENCOUNTER — Ambulatory Visit (INDEPENDENT_AMBULATORY_CARE_PROVIDER_SITE_OTHER): Payer: Medicare Other | Admitting: Family Medicine

## 2020-09-15 ENCOUNTER — Other Ambulatory Visit: Payer: Self-pay

## 2020-09-15 VITALS — BP 138/80 | HR 74 | Temp 98.0°F | Ht 66.0 in | Wt 153.5 lb

## 2020-09-15 DIAGNOSIS — Z79899 Other long term (current) drug therapy: Secondary | ICD-10-CM

## 2020-09-15 DIAGNOSIS — Z87891 Personal history of nicotine dependence: Secondary | ICD-10-CM | POA: Diagnosis not present

## 2020-09-15 DIAGNOSIS — E785 Hyperlipidemia, unspecified: Secondary | ICD-10-CM | POA: Diagnosis not present

## 2020-09-15 DIAGNOSIS — Z0000 Encounter for general adult medical examination without abnormal findings: Secondary | ICD-10-CM

## 2020-09-15 DIAGNOSIS — Z23 Encounter for immunization: Secondary | ICD-10-CM | POA: Diagnosis not present

## 2020-09-15 LAB — CBC WITH DIFFERENTIAL/PLATELET
Basophils Absolute: 0.1 10*3/uL (ref 0.0–0.1)
Basophils Relative: 0.8 % (ref 0.0–3.0)
Eosinophils Absolute: 0 10*3/uL (ref 0.0–0.7)
Eosinophils Relative: 0.4 % (ref 0.0–5.0)
HCT: 51.3 % (ref 39.0–52.0)
Hemoglobin: 17.2 g/dL — ABNORMAL HIGH (ref 13.0–17.0)
Lymphocytes Relative: 23 % (ref 12.0–46.0)
Lymphs Abs: 1.6 10*3/uL (ref 0.7–4.0)
MCHC: 33.6 g/dL (ref 30.0–36.0)
MCV: 86.4 fl (ref 78.0–100.0)
Monocytes Absolute: 0.5 10*3/uL (ref 0.1–1.0)
Monocytes Relative: 7.5 % (ref 3.0–12.0)
Neutro Abs: 4.8 10*3/uL (ref 1.4–7.7)
Neutrophils Relative %: 68.3 % (ref 43.0–77.0)
Platelets: 289 10*3/uL (ref 150.0–400.0)
RBC: 5.94 Mil/uL — ABNORMAL HIGH (ref 4.22–5.81)
RDW: 14 % (ref 11.5–15.5)
WBC: 7 10*3/uL (ref 4.0–10.5)

## 2020-09-15 LAB — COMPREHENSIVE METABOLIC PANEL
ALT: 17 U/L (ref 0–53)
AST: 19 U/L (ref 0–37)
Albumin: 4.6 g/dL (ref 3.5–5.2)
Alkaline Phosphatase: 52 U/L (ref 39–117)
BUN: 18 mg/dL (ref 6–23)
CO2: 28 mEq/L (ref 19–32)
Calcium: 9.8 mg/dL (ref 8.4–10.5)
Chloride: 103 mEq/L (ref 96–112)
Creatinine, Ser: 1.03 mg/dL (ref 0.40–1.50)
GFR: 76.13 mL/min (ref >60.00)
Glucose, Bld: 101 mg/dL — ABNORMAL HIGH (ref 70–99)
Potassium: 4 mEq/L (ref 3.5–5.1)
Sodium: 137 mEq/L (ref 135–145)
Total Bilirubin: 0.5 mg/dL (ref 0.2–1.2)
Total Protein: 7.3 g/dL (ref 6.0–8.3)

## 2020-09-15 LAB — LIPID PANEL
Cholesterol: 129 mg/dL (ref 0–200)
HDL: 42.4 mg/dL (ref >39.00)
LDL Cholesterol: 69 mg/dL (ref 0–99)
NonHDL: 86.13
Total CHOL/HDL Ratio: 3
Triglycerides: 88 mg/dL (ref 0.0–149.0)
VLDL: 17.6 mg/dL (ref 0.0–40.0)

## 2020-09-15 LAB — VITAMIN B12: Vitamin B-12: 346 pg/mL (ref 211–911)

## 2020-09-15 MED ORDER — ALPRAZOLAM 0.25 MG PO TABS: 0.2500 mg | ORAL_TABLET | Freq: Two times a day (BID) | ORAL | 2 refills | Status: DC | PRN

## 2020-09-15 MED ORDER — OMEPRAZOLE 20 MG PO CPDR: 20.0000 mg | DELAYED_RELEASE_CAPSULE | Freq: Every day | ORAL | 3 refills | Status: DC

## 2020-10-16 ENCOUNTER — Other Ambulatory Visit: Payer: Self-pay | Admitting: Family Medicine

## 2020-10-19 ENCOUNTER — Telehealth: Payer: Self-pay

## 2020-10-19 MED ORDER — ROSUVASTATIN CALCIUM 20 MG PO TABS: 20.0000 mg | ORAL_TABLET | ORAL | 1 refills | Status: DC

## 2020-10-19 NOTE — Telephone Encounter (Signed)
Rx sent 

## 2020-10-19 NOTE — Telephone Encounter (Signed)
..   LAST APPOINTMENT DATE: 09/15/2020   NEXT APPOINTMENT DATE:@Visit  date not found  MEDICATION:rosuvastatin (CRESTOR) 20 MG tablet

## 2020-11-30 ENCOUNTER — Encounter: Payer: Self-pay | Admitting: Family Medicine

## 2020-12-01 ENCOUNTER — Encounter: Payer: Self-pay | Admitting: Family

## 2020-12-01 ENCOUNTER — Telehealth (INDEPENDENT_AMBULATORY_CARE_PROVIDER_SITE_OTHER): Payer: Medicare Other | Admitting: Family

## 2020-12-01 ENCOUNTER — Other Ambulatory Visit: Payer: Self-pay

## 2020-12-01 ENCOUNTER — Other Ambulatory Visit: Payer: Self-pay | Admitting: Family

## 2020-12-01 VITALS — Ht 66.0 in | Wt 153.4 lb

## 2020-12-01 DIAGNOSIS — U071 COVID-19: Secondary | ICD-10-CM | POA: Diagnosis not present

## 2020-12-01 DIAGNOSIS — R059 Cough, unspecified: Secondary | ICD-10-CM

## 2020-12-01 MED ORDER — HYDROCODONE BIT-HOMATROP MBR 5-1.5 MG/5ML PO SOLN: 5.0000 mL | Freq: Three times a day (TID) | ORAL | 0 refills | Status: DC | PRN

## 2020-12-01 MED ORDER — HYDROCOD POLST-CPM POLST ER 10-8 MG/5ML PO SUER: 5.0000 mL | Freq: Two times a day (BID) | ORAL | 0 refills | Status: DC | PRN

## 2020-12-01 NOTE — Addendum Note (Signed)
Addended by: Dutch Quint B on: 12/01/2020 02:27 PM   Modules accepted: Orders

## 2020-12-01 NOTE — Progress Notes (Signed)
Virtual Visit via Video   I connected with patient on 12/01/20 at  1:40 PM EDT by a video enabled telemedicine application and verified that I am speaking with the correct person using two identifiers.  Location patient: Home Location provider: [place], Office Persons participating in the virtual visit: Breean Nannini/Melitta Marcelline Deist  I discussed the limitations of evaluation and management by telemedicine and the availability of in person appointments. The patient expressed understanding and agreed to proceed.  Subjective:   HPI:   65 year old male is seen virtually after testing positive for COVID-19 via rapid antigen and PCR testing yesterday.  Has concerns of sore throat, raspiness, cough, ear discomfort and fever.  Sore throat is his primary concern.  He is taken Tussionex in the past that has helped with URI symptoms.  He is vaccinated and boosted.  Attended a conference last week.  Denies any shortness of breath.  ROS:   See pertinent positives and negatives per HPI.  Patient Active Problem List   Diagnosis Date Noted  . Palpitations 11/22/2017  . Memory difficulties 11/22/2017  . Essential hypertension 11/21/2014  . Allergic rhinitis 11/21/2014  . BPH (benign prostatic hyperplasia) 11/21/2014  . Former smoker 11/21/2014  . Plantar fasciitis of left foot 10/22/2014  . Metatarsalgia of both feet 09/26/2014  . Hematuria 10/05/2013  . External hemorrhoids 04/17/2008  . Irritable bowel syndrome 04/17/2008  . History of colonic polyps 04/16/2008  . Anxiety state 12/27/2007  . Asthma 12/27/2007  . GERD 12/27/2007  . Hyperlipidemia 10/26/2007  . Rosacea 10/26/2007    Social History   Tobacco Use  . Smoking status: Former Smoker    Packs/day: 0.75    Years: 4.00    Pack years: 3.00    Types: Cigarettes  . Smokeless tobacco: Never Used  Substance Use Topics  . Alcohol use: Yes    Alcohol/week: 0.0 - 3.0 standard drinks    Comment: social     Current Outpatient Medications:  .  ALPRAZolam (XANAX) 0.25 MG tablet, Take 1 tablet (0.25 mg total) by mouth 2 (two) times daily as needed for anxiety (do not drive for 6-8 hours after taking)., Disp: 30 tablet, Rfl: 2 .  bisoprolol (ZEBETA) 5 MG tablet, TAKE 1/2 TABLET(2.5 MG) BY MOUTH DAILY, Disp: 45 tablet, Rfl: 3 .  chlorpheniramine-HYDROcodone (TUSSIONEX PENNKINETIC ER) 10-8 MG/5ML SUER, Take 5 mLs by mouth every 12 (twelve) hours as needed for cough., Disp: 115 mL, Rfl: 0 .  diphenhydramine-acetaminophen (TYLENOL PM) 25-500 MG TABS tablet, Take 1 tablet by mouth at bedtime., Disp: , Rfl:  .  fluticasone (FLONASE) 50 MCG/ACT nasal spray, Place 2 sprays into both nostrils daily., Disp: 16 g, Rfl: 6 .  meloxicam (MOBIC) 7.5 MG tablet, Take 7.5 mg by mouth daily as needed., Disp: , Rfl:  .  Methylcellulose, Laxative, 500 MG TABS, Take 2 tablets by mouth 2 (two) times daily., Disp: , Rfl:  .  multivitamin (THERAGRAN) per tablet, Take 1 tablet by mouth daily., Disp: , Rfl:  .  Omega-3 Fatty Acids (FISH OIL) 1000 MG CAPS, Take 2 capsules by mouth daily., Disp: , Rfl:  .  omeprazole (PRILOSEC) 20 MG capsule, Take 1 capsule (20 mg total) by mouth daily., Disp: 90 capsule, Rfl: 3 .  Probiotic Product (PROBIOTIC DAILY PO), Take 1 capsule by mouth daily., Disp: , Rfl:  .  rosuvastatin (CRESTOR) 20 MG tablet, Take 1 tablet (20 mg total) by mouth once a week., Disp: 14 tablet, Rfl: 1 .  tamsulosin (FLOMAX) 0.4 MG CAPS capsule, Take 0.4 mg by mouth daily., Disp: , Rfl:  .  clindamycin (CLEOCIN) 150 MG capsule, Take by mouth., Disp: , Rfl:  .  fluocinonide (LIDEX) 0.05 % external solution, SMARTSIG:1 Milliliter(s) Topical Twice Daily (Patient not taking: Reported on 12/01/2020), Disp: , Rfl:  .  glycopyrrolate (ROBINUL) 2 MG tablet, TAKE 1 TABLET(2 MG) BY MOUTH TWICE DAILY AS NEEDED (Patient not taking: Reported on 12/01/2020), Disp: 180 tablet, Rfl: 1 .  Pramoxine-HC (HYDROCORTISONE ACE-PRAMOXINE) 2.5-1 %  CREA, Apply 1 application topically 2 (two) times daily as needed. (Patient not taking: Reported on 12/01/2020), Disp: 30 g, Rfl: 0  Allergies  Allergen Reactions  . Penicillins     Objective:   Ht 5\' 6"  (1.676 m)   Wt 153 lb 7 oz (69.6 kg)   BMI 24.77 kg/m   Patient is well-developed, well-nourished in no acute distress.  Resting comfortably at home.  Head is normocephalic, atraumatic.  No labored breathing.  Speech is clear and coherent with logical content.  Patient is alert and oriented at baseline.    Assessment and Plan:    Merion was seen today for covid positive, fever, headache, cough and sore throat.  Diagnoses and all orders for this visit:  COVID-19  Cough  Other orders -     chlorpheniramine-HYDROcodone (TUSSIONEX PENNKINETIC ER) 10-8 MG/5ML SUER; Take 5 mLs by mouth every 12 (twelve) hours as needed for cough.  Call the office with any questions or concerns.  Rest.  Drink plenty of fluids.  Isolate x5 days.  If no symptoms at day 5, may return to work with a well fitting mask for an additional 5 days.  Ibuprofen or Tylenol as needed for fever and sore throat.   Kennyth Arnold, FNP 12/01/2020

## 2021-03-11 ENCOUNTER — Encounter: Payer: Self-pay | Admitting: Family Medicine

## 2021-03-17 ENCOUNTER — Ambulatory Visit: Payer: Medicare Other | Admitting: Physician Assistant

## 2021-03-26 NOTE — Progress Notes (Signed)
Phone [phone] In person visit   Subjective:   Tyler [name] is a 65 y.o. year old very pleasant male patient who presents for/with See problem oriented charting Chief Complaint  Patient presents with   Weight Loss    Stated lost about 7lb in 5-6 weeks    leg cramps    Lower leg bilateral, cramps at night time     This visit occurred during the SARS-CoV-2 public health emergency.  Safety protocols were in place, including screening questions prior to the visit, additional usage of staff PPE, and extensive cleaning of exam room while observing appropriate contact time as indicated for disinfecting solutions.   Past Medical History-  Patient Active Problem List   Diagnosis Date Noted   Palpitations 11/22/2017    Priority: Medium   Memory difficulties 11/22/2017    Priority: Medium   Essential hypertension 11/21/2014    Priority: Medium   BPH (benign prostatic hyperplasia) 11/21/2014    Priority: Medium   Irritable bowel syndrome 04/17/2008    Priority: Medium   Anxiety state 12/27/2007    Priority: Medium   Hyperlipidemia 10/26/2007    Priority: Medium   Allergic rhinitis 11/21/2014    Priority: Low   Former smoker 11/21/2014    Priority: Low   Plantar fasciitis of left foot 10/22/2014    Priority: Low   Metatarsalgia of both feet 09/26/2014    Priority: Low   Hematuria 10/05/2013    Priority: Low   External hemorrhoids 04/17/2008    Priority: Low   History of colonic polyps 04/16/2008    Priority: Low   Asthma 12/27/2007    Priority: Low   GERD 12/27/2007    Priority: Low   Rosacea 10/26/2007    Priority: Low    Medications- reviewed and updated Current Outpatient Medications  Medication Sig Dispense Refill   busPIRone (BUSPAR) 5 MG tablet Take 1 tablet (5 mg total) by mouth 2 (two) times daily. 60 tablet 5   ALPRAZolam (XANAX) 0.25 MG tablet Take 1 tablet (0.25 mg total) by mouth 2 (two) times daily as needed for anxiety (do not drive for 6-8  hours after taking). 30 tablet 2   bisoprolol (ZEBETA) 5 MG tablet TAKE 1/2 TABLET(2.5 MG) BY MOUTH DAILY 45 tablet 3   diphenhydramine-acetaminophen (TYLENOL PM) 25-500 MG TABS tablet Take 1 tablet by mouth at bedtime.     fluocinonide (LIDEX) 0.05 % external solution SMARTSIG:1 Milliliter(s) Topical Twice Daily (Patient not taking: Reported on 12/01/2020)     fluticasone (FLONASE) 50 MCG/ACT nasal spray Place 2 sprays into both nostrils daily. 16 g 6   glycopyrrolate (ROBINUL) 2 MG tablet TAKE 1 TABLET(2 MG) BY MOUTH TWICE DAILY AS NEEDED (Patient not taking: Reported on 12/01/2020) 180 tablet 1   HYDROcodone bit-homatropine (HYCODAN) 5-1.5 MG/5ML syrup Take 5 mLs by mouth every 8 (eight) hours as needed for cough. 120 mL 0   meloxicam (MOBIC) 7.5 MG tablet Take 7.5 mg by mouth daily as needed.     multivitamin (THERAGRAN) per tablet Take 1 tablet by mouth daily.     Omega-3 Fatty Acids (FISH OIL) 1000 MG CAPS Take 2 capsules by mouth daily.     omeprazole (PRILOSEC) 20 MG capsule Take 1 capsule (20 mg total) by mouth daily. 90 capsule 3   Pramoxine-HC (HYDROCORTISONE ACE-PRAMOXINE) 2.5-1 % CREA Apply 1 application topically 2 (two) times daily as needed. (Patient not taking: Reported on 12/01/2020) 30 g 0   Probiotic Product (PROBIOTIC DAILY PO)  Take 1 capsule by mouth daily.     rosuvastatin (CRESTOR) 20 MG tablet Take 1 tablet (20 mg total) by mouth once a week. 14 tablet 1   tamsulosin (FLOMAX) 0.4 MG CAPS capsule Take 0.4 mg by mouth daily.     No current facility-administered medications for this visit.     Objective:  BP 139/83   Pulse 80   Temp 98.4 F (36.9 C) (Temporal)   Ht '5\' 6"'$  (1.676 m)   Wt 146 lb 9.6 oz (66.5 kg)   SpO2 97%   BMI 23.66 kg/m  Gen: NAD, resting comfortably CV: RRR no murmurs rubs or gallops Lungs: CTAB no crackles, wheeze, rhonchi Abdomen: soft/nontender/nondistended/normal bowel sounds. No rebound or guarding.  Ext: no edema Skin: warm, dry     Assessment and Plan   # Night cramps in legs  S: 3 weeks of symptoms. rode Saturday and no issues that night. In middle of week has noted leg cramps though around ankle- can make it harder to sleep. Doesn't bother him in the day.  No help with mustard. Tried gatorade last week but perhaps mild help.  -bar of soap didn't work -12 oz 3-4x a day A/P:  possible electrolyte issues- update CMP, magnesium- also check TSh -has not tried banana yet  #Unintentional  Weight loss S: Patient reports losing 7 pounds in last 5 to 6 weeks.  On our scales weight is down 7 pounds from last visit in May with covid.   Didn't have any issues until got back from vacation in July. Noted decreased appetite at that time. Rapid at first but leveled off in last week or two. Has been around 145 on home scales. Still riding his bike. Did not go anywhere exotic or eat anything atypical- was only in [place], [place].   Has felt slightly more irritable, frustarated. PHQ9 of 5- scored one for feeling down, depressed, hopeless, scored 0 for little interest or pleasure in doing things. Less patience in last few weeks. Some changes at work that have increased stress. Also still having pain after a root canal last year- some discomfort during the day with this- does not affect while eating or while sleeping  IBS symptoms variable- havent increased from baseline. More loose stools. Still doing fiber and stool softener.    Age-appropriate cancer screenings -Colon cancer screening- 05/27/16 with 10 year repeat -Lung Cancer screening- not a candidate -Skin cancer screening- follows with Dr. Pearline Cables of dermatology -Prostate cancer screening- PSA with Dr. Louis Meckel- sees again october  A/P: 65 year old male with unintentional weight loss with change in appetite -close to 5% and about 6 weeks-he is up-to-date on age-appropriate cancer screenings.  We will screen for potential causes with labs below -i do wonder if baseline anxiety could  be playing a role-we opted to try buspirone 5 mg with increased stressors at work-can start with just once daily and increase to twice daily if needed after 2 weeks.  Recommended we follow-up in 6 to 8 weeks to recheck -If inflammatory markers are elevated could consider CT abdomen pelvis otherwise wait until that visit to decide -will update stool cards for blood/with IBS symptoms will also refer back to GI for their opinion.  -hiv, hcv, ua hold off - will get UA with urology and low risk hiv and hcv -opted out of CXR for now -did have covid 19 in may but symptoms started significantly later  #hypertension typically with white coat element S: medication: bisoprolol 2.5 mg daily Home  readings #s: Friday at home 125/83 BP Readings from Last 3 Encounters:  03/29/21 139/83  09/15/20 138/80  09/12/20 (!) 145/99  A/P: Hypertension with whitecoat element-blood pressures have been very well controlled at home-still reasonably controlled in office-continue current medication  Recommended follow up: Return in about 6 weeks (around 05/10/2021) for follow-up to recheck or sooner if needed. Future Appointments  Date Time Provider [place]  09/21/2021  8:20 AM Marin Olp, MD [facility]    Lab/Order associations:   ICD-10-CM   1. Anxiety state  F41.1     2. Essential hypertension  I10 CBC with Differential/Platelet    Comprehensive metabolic panel    3. Hyperlipidemia, unspecified hyperlipidemia type  E78.5 TSH    4. Irritable bowel syndrome with both constipation and diarrhea  K58.2 Ambulatory referral to Gastroenterology    5. Hyperglycemia  R73.9 Hemoglobin A1c    6. Unintentional weight loss  R63.4 Fecal occult blood, imunochemical    Sedimentation rate    C-reactive protein    Ambulatory referral to Gastroenterology    7. Leg cramps  R25.2 Magnesium     Meds ordered this encounter  Medications   busPIRone (BUSPAR) 5 MG tablet    Sig: Take 1 tablet (5 mg total) by  mouth 2 (two) times daily.    Dispense:  60 tablet    Refill:  5     I,Jada Bradford,acting as a scribe for Garret Reddish, MD.,have documented all relevant documentation on the behalf of Garret Reddish, MD,as directed by  Garret Reddish, MD while in the presence of Garret Reddish, MD.   I, Garret Reddish, MD, have reviewed all documentation for this visit. The documentation on 03/29/21 for the exam, diagnosis, procedures, and orders are all accurate and complete.   Return precautions advised.  Garret Reddish, MD

## 2021-03-29 ENCOUNTER — Other Ambulatory Visit: Payer: Self-pay

## 2021-03-29 ENCOUNTER — Encounter: Payer: Self-pay | Admitting: Family Medicine

## 2021-03-29 ENCOUNTER — Ambulatory Visit (INDEPENDENT_AMBULATORY_CARE_PROVIDER_SITE_OTHER): Payer: Medicare Other | Admitting: Family Medicine

## 2021-03-29 VITALS — BP 139/83 | HR 80 | Temp 98.4°F | Ht 66.0 in | Wt 146.6 lb

## 2021-03-29 DIAGNOSIS — R634 Abnormal weight loss: Secondary | ICD-10-CM

## 2021-03-29 DIAGNOSIS — E785 Hyperlipidemia, unspecified: Secondary | ICD-10-CM | POA: Diagnosis not present

## 2021-03-29 DIAGNOSIS — K582 Mixed irritable bowel syndrome: Secondary | ICD-10-CM

## 2021-03-29 DIAGNOSIS — R252 Cramp and spasm: Secondary | ICD-10-CM | POA: Diagnosis not present

## 2021-03-29 DIAGNOSIS — F411 Generalized anxiety disorder: Secondary | ICD-10-CM

## 2021-03-29 DIAGNOSIS — I10 Essential (primary) hypertension: Secondary | ICD-10-CM

## 2021-03-29 DIAGNOSIS — R739 Hyperglycemia, unspecified: Secondary | ICD-10-CM

## 2021-03-29 LAB — C-REACTIVE PROTEIN: CRP: <1 mg/dL (ref 0.5–20.0)

## 2021-03-29 LAB — CBC WITH DIFFERENTIAL/PLATELET
Basophils Absolute: 0.1 10*3/uL (ref 0.0–0.1)
Basophils Relative: 0.6 % (ref 0.0–3.0)
Eosinophils Absolute: 0 10*3/uL (ref 0.0–0.7)
Eosinophils Relative: 0.5 % (ref 0.0–5.0)
HCT: 47.7 % (ref 39.0–52.0)
Hemoglobin: 16.1 g/dL (ref 13.0–17.0)
Lymphocytes Relative: 16.9 % (ref 12.0–46.0)
Lymphs Abs: 1.6 10*3/uL (ref 0.7–4.0)
MCHC: 33.7 g/dL (ref 30.0–36.0)
MCV: 87.4 fl (ref 78.0–100.0)
Monocytes Absolute: 0.7 10*3/uL (ref 0.1–1.0)
Monocytes Relative: 7.5 % (ref 3.0–12.0)
Neutro Abs: 6.8 10*3/uL (ref 1.4–7.7)
Neutrophils Relative %: 74.5 % (ref 43.0–77.0)
Platelets: 294 10*3/uL (ref 150.0–400.0)
RBC: 5.46 Mil/uL (ref 4.22–5.81)
RDW: 15.1 % (ref 11.5–15.5)
WBC: 9.2 10*3/uL (ref 4.0–10.5)

## 2021-03-29 LAB — HEMOGLOBIN A1C: Hgb A1c MFr Bld: 5.6 % (ref 4.6–6.5)

## 2021-03-29 LAB — TSH: TSH: 2.17 u[IU]/mL (ref 0.35–5.50)

## 2021-03-29 LAB — COMPREHENSIVE METABOLIC PANEL
ALT: 17 U/L (ref 0–53)
AST: 17 U/L (ref 0–37)
Albumin: 4.2 g/dL (ref 3.5–5.2)
Alkaline Phosphatase: 46 U/L (ref 39–117)
BUN: 24 mg/dL — ABNORMAL HIGH (ref 6–23)
CO2: 23 mEq/L (ref 19–32)
Calcium: 9.7 mg/dL (ref 8.4–10.5)
Chloride: 105 mEq/L (ref 96–112)
Creatinine, Ser: 1.05 mg/dL (ref 0.40–1.50)
GFR: 74.11 mL/min (ref >60.00)
Glucose, Bld: 89 mg/dL (ref 70–99)
Potassium: 3.9 mEq/L (ref 3.5–5.1)
Sodium: 139 mEq/L (ref 135–145)
Total Bilirubin: 0.5 mg/dL (ref 0.2–1.2)
Total Protein: 6.9 g/dL (ref 6.0–8.3)

## 2021-03-29 LAB — SEDIMENTATION RATE: Sed Rate: 8 mm/hr (ref 0–20)

## 2021-03-29 LAB — MAGNESIUM: Magnesium: 2.1 mg/dL (ref 1.5–2.5)

## 2021-03-29 MED ORDER — BUSPIRONE HCL 5 MG PO TABS: 5.0000 mg | ORAL_TABLET | Freq: Two times a day (BID) | ORAL | 5 refills | Status: DC

## 2021-03-29 NOTE — Patient Instructions (Addendum)
Health Maintenance Due  Topic Date Due   Zoster Vaccines- Shingrix (1 of 2)  - Please check with your pharmacy to see if they have the shingrix vaccine. If they do- please get this immunization and update [place] by phone call or mychart with dates you receive the vaccine  Never done   COVID-19 Vaccine (4 - Booster for [employer] series)  - Please consider new Variant-Specific COVID-19 vaccination in the Fall.  -Please send [place] the date of booster #2  10/29/2020   INFLUENZA VACCINE   - Please consider getting your flu shot in the Fall. If you get this outside of our office, please let us know.  03/01/2021   Please stop by lab before you go If you have mychart- we will send your results within 3 business days of [place] receiving them.  If you do not have mychart- we will call you about results within 5 business days of [place] receiving them.  *please also note that you will see labs on mychart as soon as they post. I will later go in and write notes on them- will say "notes from Dr. Yong Channel"   Try buspirone one tablet in AM daily for 2 weeks and if feel like not much benefit can increase to twice a day  Try banana at night- if that doesn't work may actually have you increase hydration by 20 oz a day to see if that makes a difference  Team please make sure he gets stool cards.   We will call you within two weeks about your referral to GI. If you do not hear within 2 weeks, give [place] a call.    Recommended follow up: Return in about 6-8 weeks (around 05/10/2021 or 05/24/2021 for follow-up to recheck or sooner if needed.

## 2021-04-09 ENCOUNTER — Other Ambulatory Visit (INDEPENDENT_AMBULATORY_CARE_PROVIDER_SITE_OTHER): Payer: Medicare Other

## 2021-04-09 DIAGNOSIS — R634 Abnormal weight loss: Secondary | ICD-10-CM

## 2021-04-12 LAB — FECAL OCCULT BLOOD, IMMUNOCHEMICAL: Fecal Occult Bld: NEGATIVE

## 2021-05-11 ENCOUNTER — Ambulatory Visit: Payer: Medicare Other | Admitting: Family Medicine

## 2021-05-13 ENCOUNTER — Other Ambulatory Visit: Payer: Self-pay | Admitting: Family Medicine

## 2021-05-14 ENCOUNTER — Encounter: Payer: Self-pay | Admitting: Gastroenterology

## 2021-05-17 ENCOUNTER — Telehealth: Payer: Self-pay

## 2021-05-17 NOTE — Telephone Encounter (Signed)
Noted  

## 2021-05-17 NOTE — Telephone Encounter (Signed)
Nurse Assessment Nurse: Rufina Falco, RN, Deb Date/Time Eilene Ghazi Time): 05/14/2021 9:06:46 PM Confirm and document reason for call. If symptomatic, describe symptoms. ---Caller states he has had a bad cold all week. Last night pt got the hiccups and they will not go away. They are intermittent but keep coming back Does the patient have any new or worsening symptoms? ---Yes Will a triage be completed? ---Yes Related visit to physician within the last 2 weeks? ---No Does the PT have any chronic conditions? (i.e. diabetes, asthma, this includes High risk factors for pregnancy, etc.) ---No Is this a behavioral health or substance abuse call? ---No Guidelines Guideline Title Affirmed Question Affirmed Notes Nurse Date/Time (Eastern Time) Hiccups [1] Hiccups present > 24 hours AND [2] nearly continuous Rufina Falco, RN, Deb 05/14/2021 9:07:19 PM Disp. Time Eilene Ghazi Time) Disposition Final User 05/14/2021 9:04:58 PM Send To Nurse Krista Blue, RN, Barnetta Chapel 05/14/2021 9:11:46 PM See PCP within 24 Hours Yes Rufina Falco, RN, Deb PLEASE NOTE: All timestamps contained within this report are represented as [place] Standard Time. CONFIDENTIALTY NOTICE: This fax transmission is intended only for the addressee. It contains information that is legally privileged, confidential or otherwise protected from use or disclosure. If you are not the intended recipient, you are strictly prohibited from reviewing, disclosing, copying using or disseminating any of this information or taking any action in reliance on or regarding this information. If you have received this fax in error, please notify us immediately by telephone so that we can arrange for its return to [place]. Phone: [phone], Toll-Free: [phone], Fax: [phone] Page: 2 of 2 Call Id: [id number] [place] Disagree/Comply Comply Caller Understands Yes PreDisposition InappropriateToAsk Care Advice Given Per Guideline SEE PCP WITHIN 24 HOURS: * Swallow a  teaspoonful of dry granulated sugar. * Can repeat several times at 1 minute intervals. TABLE SUGAR: * Sip ice water. OTHER HOME REMEDIES: * Drink cold water through a straw while pinching the nose. * Gargle with cold water. * Bite into a slice of lemon. * Pull on your tongue with your fingers. * Lift up your uvula with a cold spoon (i.e., make yourself gag). * Hold your breath and count to 20 (or higher if you can). * Breathe into a paper (not plastic) bag for 30 to 60 seconds. CALL BACK IF: * You become worse CARE ADVICE given per Hiccups (Adult) guideline. * IF OFFICE WILL BE CLOSED: You need to be seen within the next 24 hours. A clinic or an urgent care center is often a good source of care if your doctor's office is closed or you can't get an appointment. Referrals GO TO FACILITY OTHER - [facility]

## 2021-05-17 NOTE — Telephone Encounter (Signed)
Spoke with patient, who states the hiccups did go away but cant get rid of his cold. Did schedule patient for a virtual tomorrow morning.

## 2021-05-18 ENCOUNTER — Encounter: Payer: Self-pay | Admitting: Family Medicine

## 2021-05-18 ENCOUNTER — Telehealth (INDEPENDENT_AMBULATORY_CARE_PROVIDER_SITE_OTHER): Payer: Medicare Other | Admitting: Family Medicine

## 2021-05-18 DIAGNOSIS — J329 Chronic sinusitis, unspecified: Secondary | ICD-10-CM

## 2021-05-18 DIAGNOSIS — F411 Generalized anxiety disorder: Secondary | ICD-10-CM

## 2021-05-18 DIAGNOSIS — B9689 Other specified bacterial agents as the cause of diseases classified elsewhere: Secondary | ICD-10-CM | POA: Diagnosis not present

## 2021-05-18 MED ORDER — HYDROCODONE BIT-HOMATROP MBR 5-1.5 MG/5ML PO SOLN: 5.0000 mL | Freq: Three times a day (TID) | ORAL | 0 refills | Status: DC | PRN

## 2021-05-18 MED ORDER — AZITHROMYCIN 250 MG PO TABS
ORAL_TABLET | ORAL | 0 refills | Status: DC
Start: 2021-05-18 — End: 2021-08-13

## 2021-05-18 NOTE — Progress Notes (Signed)
Phone [phone] Virtual visit via Video note   Subjective:  Chief complaint: Chief Complaint  Patient presents with   Cough    With phlem it's light yellow sometimes and other times its clear.    Nasal Drainage   This visit type was conducted due to national recommendations for restrictions regarding the COVID-19 Pandemic (e.g. social distancing).  This format is felt to be most appropriate for this patient at this time balancing risks to patient and risks to population by having him in for in person visit.  No physical exam was performed (except for noted visual exam or audio findings with Telehealth visits).    Our team/I connected with Miquel Dunn at  9:40 AM EDT by a video enabled telemedicine application (doxy.me or caregility through epic) and verified that I am speaking with the correct person using two identifiers.  Location patient: [place] Location provider: [facility], office Persons participating in the virtual visit:  patient  Our team/I discussed the limitations of evaluation and management by telemedicine and the availability of in person appointments. In light of current covid-19 pandemic, patient also understands that we are trying to protect them by minimizing in office contact if at all possible.  The patient expressed consent for telemedicine visit and agreed to proceed. Patient understands insurance will be billed.   Past Medical History-  Patient Active Problem List   Diagnosis Date Noted   Palpitations 11/22/2017    Priority: 2.   Memory difficulties 11/22/2017    Priority: 2.   Essential hypertension 11/21/2014    Priority: 2.   BPH (benign prostatic hyperplasia) 11/21/2014    Priority: 2.   Irritable bowel syndrome 04/17/2008    Priority: 2.   Anxiety state 12/27/2007    Priority: 2.   Hyperlipidemia 10/26/2007    Priority: 2.   Allergic rhinitis 11/21/2014    Priority: 3.   Former smoker 11/21/2014    Priority: 3.   Plantar fasciitis of  left foot 10/22/2014    Priority: 3.   Metatarsalgia of both feet 09/26/2014    Priority: 3.   Hematuria 10/05/2013    Priority: 3.   External hemorrhoids 04/17/2008    Priority: 3.   History of colonic polyps 04/16/2008    Priority: 3.   Asthma 12/27/2007    Priority: 3.   GERD 12/27/2007    Priority: 3.   Rosacea 10/26/2007    Priority: 3.    Medications- reviewed and updated Current Outpatient Medications  Medication Sig Dispense Refill   ALPRAZolam (XANAX) 0.25 MG tablet Take 1 tablet (0.25 mg total) by mouth 2 (two) times daily as needed for anxiety (do not drive for 6-8 hours after taking). 30 tablet 2   azithromycin (ZITHROMAX) 250 MG tablet Take 2 tabs on day 1, then 1 tab daily until finished 6 tablet 0   bisoprolol (ZEBETA) 5 MG tablet TAKE 1/2 TABLET(2.5 MG) BY MOUTH DAILY 45 tablet 3   busPIRone (BUSPAR) 5 MG tablet Take 1 tablet (5 mg total) by mouth 2 (two) times daily. (Patient taking differently: Take 5 mg by mouth daily.) 60 tablet 5   diphenhydramine-acetaminophen (TYLENOL PM) 25-500 MG TABS tablet Take 1 tablet by mouth at bedtime.     fluticasone (FLONASE) 50 MCG/ACT nasal spray Place 2 sprays into both nostrils daily. 16 g 6   glycopyrrolate (ROBINUL) 2 MG tablet TAKE 1 TABLET(2 MG) BY MOUTH TWICE DAILY AS NEEDED 180 tablet 1   meloxicam (MOBIC) 7.5 MG tablet Take  7.5 mg by mouth daily as needed.     multivitamin (THERAGRAN) per tablet Take 1 tablet by mouth daily.     omeprazole (PRILOSEC) 20 MG capsule Take 1 capsule (20 mg total) by mouth daily. 90 capsule 3   Pramoxine-HC (HYDROCORTISONE ACE-PRAMOXINE) 2.5-1 % CREA Apply 1 application topically 2 (two) times daily as needed. 30 g 0   Probiotic Product (PROBIOTIC DAILY PO) Take 1 capsule by mouth daily.     rosuvastatin (CRESTOR) 20 MG tablet TAKE 1 TABLET(20 MG) BY MOUTH 1 TIME A WEEK 14 tablet 1   tamsulosin (FLOMAX) 0.4 MG CAPS capsule Take 0.4 mg by mouth daily.     HYDROcodone bit-homatropine (HYCODAN)  5-1.5 MG/5ML syrup Take 5 mLs by mouth every 8 (eight) hours as needed for cough (do not drive for 8 hours after taking). 120 mL 0   Omega-3 Fatty Acids (FISH OIL) 1000 MG CAPS Take 2 capsules by mouth daily. (Patient not taking: Reported on 05/18/2021)     No current facility-administered medications for this visit.     Objective:  no self reported vitals Gen: NAD, resting comfortably Lungs: nonlabored, normal respiratory rate  Skin: appears dry, no obvious rash     Assessment and Plan   #Cough/congestion/nasal discharge/nasal pressure S:patient took a bike ride Saturday before last (was a little cold). By Sunday started sniffles, some coughing. Now has real bad cough and coughing up phlegm- clear or yellow- also fair amount of spit up. . A lot of discharge from nose- mainly clear. Had a period of bad hiccups. Dealing with hoarseness. Keeping him up at night. Belching more than normal. Some but not substantial sinus pressure - is getting some sinus headaches - 2 home tests spread apart negative for covid - symptoms are not improving substantially   He denies anyfever/chills/shortness of breath or chest pain. Patient has tried taking allegra, dayquil, nyquil, robitussin- no relief.  A/P: Patient with 10 days of symptoms of cough/congestion/nasal discharge and pressure-the pressure is not significant though-given lack of improvement of symptoms in the last 10 days-we discussed possible bacterial superinfection to viral sinusitis-we will treat with azithromycin and patient will follow-up if fails to improve.  Did not respond to allergy treatment with Allegra also think this is less likely - Patient also requests cough medicine to help him sleep-Hycodan was sent in per his request-discussed no driving for 8 hours after taking  # Anxiety S:Medication: Buspar 5 mg twice daily rx- doing once daily- mild improvement- considering twice daily, -Also has Xanax 0.25 mg twice daily as needed for  sparing use with flying/doctor visits/public speaking A/P: Baseline anxiety sounds to be slightly improved on buspirone 5 mg once daily-encouraged patient to try twice daily and then he can update me in 1 month with follow-up.  We also need to check in on prior weight loss issues at that time  Recommended follow up:  Future Appointments  Date Time Provider [place]  06/14/2021  8:40 AM Marin Olp, MD [facility]  06/29/2021  2:10 PM Ladene Artist, MD [facility]  09/21/2021  8:20 AM Yong Channel Brayton Mars, MD [facility]    Lab/Order associations:   ICD-10-CM   1. Anxiety state  F41.1     2. Bacterial sinusitis  J32.9    B96.89       Meds ordered this encounter  Medications   azithromycin (ZITHROMAX) 250 MG tablet    Sig: Take 2 tabs on day 1, then 1 tab daily  until finished    Dispense:  6 tablet    Refill:  0   HYDROcodone bit-homatropine (HYCODAN) 5-1.5 MG/5ML syrup    Sig: Take 5 mLs by mouth every 8 (eight) hours as needed for cough (do not drive for 8 hours after taking).    Dispense:  120 mL    Refill:  0    Time Spent: 23 minutes of total time (10:05 AM- 10:28 AM) was spent on the date of the encounter performing the following actions: chart review prior to seeing the patient, obtaining history, performing a medically necessary exam, counseling on the treatment plan, placing orders, and documenting in our EHR.    Return precautions advised.  Garret Reddish, MD

## 2021-05-18 NOTE — Patient Instructions (Addendum)
Health Maintenance Due  Topic Date Due   Zoster Vaccines- Shingrix (1 of 2) - Please check with your pharmacy to see if they have the shingrix vaccine. If they do- please get this immunization once you are feeling better and update [place] by phone call or mychart with dates you receive the vaccine  Never done   COVID-19 Vaccine (4 - Booster for [employer] series)  - Recommend getting Omicron/Bivalent booster only at your local pharmacy once you are feeling better! Please let us know when you have received this vaccination.   10/23/2020   INFLUENZA VACCINE   - Please consider getting your flu shot this Fall once you start feeling better. If you get this outside of our office, please let us know.  03/01/2021    Recommended follow up: No follow-ups on file.

## 2021-05-25 ENCOUNTER — Other Ambulatory Visit: Payer: Self-pay | Admitting: Urology

## 2021-05-25 DIAGNOSIS — R972 Elevated prostate specific antigen [PSA]: Secondary | ICD-10-CM

## 2021-06-04 NOTE — Progress Notes (Incomplete)
Phone [phone] In person visit   Subjective:   Tyler [name] is a 65 y.o. year old very pleasant male patient who presents for/with See problem oriented charting No chief complaint on file.   This visit occurred during the SARS-CoV-2 public health emergency.  Safety protocols were in place, including screening questions prior to the visit, additional usage of staff PPE, and extensive cleaning of exam room while observing appropriate contact time as indicated for disinfecting solutions.   Past Medical History-  Patient Active Problem List   Diagnosis Date Noted   Palpitations 11/22/2017   Memory difficulties 11/22/2017   Essential hypertension 11/21/2014   Allergic rhinitis 11/21/2014   BPH (benign prostatic hyperplasia) 11/21/2014   Former smoker 11/21/2014   Plantar fasciitis of left foot 10/22/2014   Metatarsalgia of both feet 09/26/2014   Hematuria 10/05/2013   External hemorrhoids 04/17/2008   Irritable bowel syndrome 04/17/2008   History of colonic polyps 04/16/2008   Anxiety state 12/27/2007   Asthma 12/27/2007   GERD 12/27/2007   Hyperlipidemia 10/26/2007   Rosacea 10/26/2007    Medications- reviewed and updated Current Outpatient Medications  Medication Sig Dispense Refill   ALPRAZolam (XANAX) 0.25 MG tablet Take 1 tablet (0.25 mg total) by mouth 2 (two) times daily as needed for anxiety (do not drive for 6-8 hours after taking). 30 tablet 2   azithromycin (ZITHROMAX) 250 MG tablet Take 2 tabs on day 1, then 1 tab daily until finished 6 tablet 0   bisoprolol (ZEBETA) 5 MG tablet TAKE 1/2 TABLET(2.5 MG) BY MOUTH DAILY 45 tablet 3   busPIRone (BUSPAR) 5 MG tablet Take 1 tablet (5 mg total) by mouth 2 (two) times daily. (Patient taking differently: Take 5 mg by mouth daily.) 60 tablet 5   diphenhydramine-acetaminophen (TYLENOL PM) 25-500 MG TABS tablet Take 1 tablet by mouth at bedtime.     fluticasone (FLONASE) 50 MCG/ACT nasal spray Place 2 sprays into both  nostrils daily. 16 g 6   glycopyrrolate (ROBINUL) 2 MG tablet TAKE 1 TABLET(2 MG) BY MOUTH TWICE DAILY AS NEEDED 180 tablet 1   HYDROcodone bit-homatropine (HYCODAN) 5-1.5 MG/5ML syrup Take 5 mLs by mouth every 8 (eight) hours as needed for cough (do not drive for 8 hours after taking). 120 mL 0   meloxicam (MOBIC) 7.5 MG tablet Take 7.5 mg by mouth daily as needed.     multivitamin (THERAGRAN) per tablet Take 1 tablet by mouth daily.     Omega-3 Fatty Acids (FISH OIL) 1000 MG CAPS Take 2 capsules by mouth daily. (Patient not taking: Reported on 05/18/2021)     omeprazole (PRILOSEC) 20 MG capsule Take 1 capsule (20 mg total) by mouth daily. 90 capsule 3   Pramoxine-HC (HYDROCORTISONE ACE-PRAMOXINE) 2.5-1 % CREA Apply 1 application topically 2 (two) times daily as needed. 30 g 0   Probiotic Product (PROBIOTIC DAILY PO) Take 1 capsule by mouth daily.     rosuvastatin (CRESTOR) 20 MG tablet TAKE 1 TABLET(20 MG) BY MOUTH 1 TIME A WEEK 14 tablet 1   tamsulosin (FLOMAX) 0.4 MG CAPS capsule Take 0.4 mg by mouth daily.     No current facility-administered medications for this visit.     Objective:  There were no vitals taken for this visit. Gen: NAD, resting comfortably CV: RRR no murmurs rubs or gallops Lungs: CTAB no crackles, wheeze, rhonchi Abdomen: soft/nontender/nondistended/normal bowel sounds. No rebound or guarding.  Ext: no edema Skin: warm, dry Neuro: grossly normal, moves all extremities  ***  Assessment and Plan   #Cough/congestion/nasal discharge/nasal pressure S:patient reported he took a bike ride Saturday before last on 10/22 visit. (was a little cold). By that Sunday started sniffles, some coughing. At visit, had real bad cough and coughing up phlegm- clear or yellow- also fair amount of spit up. . A lot of discharge from nose- mainly clear. Had a period of bad hiccups. Dealt with hoarseness. Keeping him up at night. Was belching more than normal. Some but not substantial  sinus pressure - was getting some sinus headaches - 2 home tests spread apart negative for covid - symptoms were not improving substantially   -He denied anyfever/chills/shortness of breath or chest pain. Patient had tried taking allegra, dayquil, nyquil, robitussin- no relief.  A/P: ***   # Anxiety S:Medication: Buspar 5 mg twice daily rx- doing once daily- mild improvement- considered twice daily, -Also had Xanax 0.25 mg twice daily as needed for sparing use with flying/doctor visits/public speaking. We also needed to check in on prior weight loss issues at that time A/P: ***   #hypertension typically with white coat element  S: medication: Bisoprolol 2.5 mg daily  Home readings #s: *** BP Readings from Last 3 Encounters:  03/29/21 139/83  09/15/20 138/80  09/12/20 (!) 145/99  A/P: ***  #hyperlipidemia S: Medication:Crestor 20 mg once a week  Lab Results  Component Value Date   CHOL 129 09/15/2020   HDL 42.40 09/15/2020   LDLCALC 69 09/15/2020   LDLDIRECT 152.5 11/18/2008   TRIG 88.0 09/15/2020   CHOLHDL 3 09/15/2020   A/P: ***  Health Maintenance Due  Topic Date Due   Zoster Vaccines- Shingrix (1 of 2) Never done   COVID-19 Vaccine (4 - Booster for Pfizer series) 09/25/2020   INFLUENZA VACCINE  03/01/2021   Recommended follow up: No follow-ups on file. Future Appointments  Date Time Provider [place]  06/14/2021  8:40 AM Marin Olp, MD [facility]  06/18/2021  3:40 PM [facility] MR 3 [facility]315[facility] [address] [facility]  06/29/2021  2:10 PM Ladene Artist, MD [facility]  09/21/2021  8:20 AM Marin Olp, MD [facility]    Lab/Order associations: No diagnosis found.  No orders of the defined types were placed in this encounter.   I,Jada Bradford,acting as a scribe for Garret Reddish, MD.,have documented all relevant documentation on the behalf of Garret Reddish, MD,as directed by  Garret Reddish, MD while in the presence of Garret Reddish,  MD.  *** Return precautions advised.  Burnett Corrente

## 2021-06-14 ENCOUNTER — Ambulatory Visit: Payer: Medicare Other | Admitting: Family Medicine

## 2021-06-14 DIAGNOSIS — Z23 Encounter for immunization: Secondary | ICD-10-CM

## 2021-06-14 DIAGNOSIS — E785 Hyperlipidemia, unspecified: Secondary | ICD-10-CM

## 2021-06-14 DIAGNOSIS — F411 Generalized anxiety disorder: Secondary | ICD-10-CM

## 2021-06-14 DIAGNOSIS — I10 Essential (primary) hypertension: Secondary | ICD-10-CM

## 2021-06-14 NOTE — Patient Instructions (Signed)
Health Maintenance Due  Topic Date Due   Zoster Vaccines- Shingrix (1 of 2)-  Never done   COVID-19 Vaccine (4 - Booster for Pfizer series)-  09/25/2020   INFLUENZA VACCINE -  03/01/2021    Recommended follow up: No follow-ups on file.

## 2021-06-18 ENCOUNTER — Other Ambulatory Visit: Payer: Self-pay

## 2021-06-18 ENCOUNTER — Ambulatory Visit
Admission: RE | Admit: 2021-06-18 | Discharge: 2021-06-18 | Disposition: A | Discharge status: Home / Self Care | Payer: Medicare Other | Source: Ambulatory Visit | Attending: Urology | Admitting: Urology

## 2021-06-18 DIAGNOSIS — R972 Elevated prostate specific antigen [PSA]: Secondary | ICD-10-CM

## 2021-06-18 MED ORDER — GADOBENATE DIMEGLUMINE 529 MG/ML IV SOLN
14.0000 mL | Freq: Once | INTRAVENOUS | Status: AC | PRN
  Administered 2021-06-18: 14 mL via INTRAVENOUS

## 2021-06-21 ENCOUNTER — Other Ambulatory Visit: Payer: Self-pay

## 2021-06-21 ENCOUNTER — Ambulatory Visit (INDEPENDENT_AMBULATORY_CARE_PROVIDER_SITE_OTHER): Payer: Medicare Other | Admitting: Family Medicine

## 2021-06-21 DIAGNOSIS — Z23 Encounter for immunization: Secondary | ICD-10-CM

## 2021-06-21 DIAGNOSIS — F411 Generalized anxiety disorder: Secondary | ICD-10-CM

## 2021-06-21 DIAGNOSIS — I10 Essential (primary) hypertension: Secondary | ICD-10-CM

## 2021-06-22 ENCOUNTER — Encounter: Payer: Self-pay | Admitting: Family Medicine

## 2021-06-22 NOTE — Progress Notes (Signed)
Patient no showed for visit

## 2021-06-29 ENCOUNTER — Encounter: Payer: Self-pay | Admitting: Family Medicine

## 2021-06-29 ENCOUNTER — Ambulatory Visit: Payer: Medicare Other | Admitting: Gastroenterology

## 2021-07-17 ENCOUNTER — Other Ambulatory Visit: Payer: Self-pay | Admitting: Family Medicine

## 2021-08-03 NOTE — Progress Notes (Signed)
I called pt to introduce myself as the Prostate Nurse Navigator and the Coordinator of the [facility].   1. I confirmed with the patient he is aware of his referral to the clinic on 08/13/2021, arriving @ 8:00 am.    2. I discussed the format of the clinic and the physicians he will be seeing that day.   3. I discussed where the clinic is located and how to contact me.   4. I confirmed his address and informed him I would be mailing a packet of information and forms to be completed. I asked him to bring them with him the day of his appointment.    He voiced understanding of the above. I asked him to call me if he has any questions or concerns regarding his appointments or the forms he needs to complete.

## 2021-08-12 ENCOUNTER — Telehealth: Payer: Self-pay

## 2021-08-12 NOTE — Telephone Encounter (Signed)
RN placed call to patient for reminder of upcoming [place] appointment.   Verbalized understanding, and confirmation of forms received for [facility].

## 2021-08-12 NOTE — Progress Notes (Addendum)
° °                              Care Plan Summary  Name: Tyler [name]  DOB: 02/22/1956   Your Medical Team:   Urologist -  Dr. Raynelle Bring, [facility]  Radiation Oncologist - Dr. Tyler Pita, [facility]   Medical Oncologist - Dr. Zola Button, [place]  Recommendations: 1) Surgery  2) Radiation    * These recommendations are based on information available as of todays consult.      Recommendations may change depending on the results of further tests or exams.    Next Steps: 1) Consider your options and contact Kathlee Nations, your nurse navigator, with decision.  When appointments need to be scheduled, you will be contacted by [facility] and/or [facility].  Questions?  Please do not hesitate to call Katheren Puller, BSN, RN at [phone] with any questions or concerns.  Kathlee Nations is your Oncology Nurse Navigator and is available to assist you while youre receiving your medical care at [facility].

## 2021-08-13 ENCOUNTER — Encounter: Payer: Self-pay | Admitting: General Practice

## 2021-08-13 ENCOUNTER — Encounter: Payer: Self-pay | Admitting: Genetic Counselor

## 2021-08-13 ENCOUNTER — Other Ambulatory Visit: Payer: Self-pay | Admitting: Genetic Counselor

## 2021-08-13 ENCOUNTER — Ambulatory Visit (HOSPITAL_BASED_OUTPATIENT_CLINIC_OR_DEPARTMENT_OTHER): Payer: Medicare Other | Admitting: Genetic Counselor

## 2021-08-13 ENCOUNTER — Inpatient Hospital Stay (HOSPITAL_BASED_OUTPATIENT_CLINIC_OR_DEPARTMENT_OTHER): Payer: Medicare Other | Admitting: Oncology

## 2021-08-13 ENCOUNTER — Inpatient Hospital Stay: Payer: Medicare Other | Attending: Radiation Oncology

## 2021-08-13 ENCOUNTER — Other Ambulatory Visit: Payer: Self-pay

## 2021-08-13 ENCOUNTER — Ambulatory Visit
Admission: RE | Admit: 2021-08-13 | Discharge: 2021-08-13 | Disposition: A | Discharge status: Home / Self Care | Payer: Medicare Other | Source: Ambulatory Visit | Attending: Radiation Oncology | Admitting: Radiation Oncology

## 2021-08-13 VITALS — BP 149/90 | HR 83 | Temp 97.8°F | Resp 18 | Ht 67.0 in | Wt 151.0 lb

## 2021-08-13 DIAGNOSIS — Z800 Family history of malignant neoplasm of digestive organs: Secondary | ICD-10-CM

## 2021-08-13 DIAGNOSIS — Z1379 Encounter for other screening for genetic and chromosomal anomalies: Secondary | ICD-10-CM

## 2021-08-13 DIAGNOSIS — Z8042 Family history of malignant neoplasm of prostate: Secondary | ICD-10-CM

## 2021-08-13 DIAGNOSIS — Z8546 Personal history of malignant neoplasm of prostate: Secondary | ICD-10-CM | POA: Insufficient documentation

## 2021-08-13 DIAGNOSIS — C61 Malignant neoplasm of prostate: Secondary | ICD-10-CM

## 2021-08-13 DIAGNOSIS — Z87891 Personal history of nicotine dependence: Secondary | ICD-10-CM | POA: Insufficient documentation

## 2021-08-13 DIAGNOSIS — Z803 Family history of malignant neoplasm of breast: Secondary | ICD-10-CM

## 2021-08-13 LAB — GENETIC SCREENING ORDER

## 2021-08-13 NOTE — Progress Notes (Signed)
Radiation Oncology         [phone] ________________________________  Multidisciplinary Prostate Cancer Clinic  Initial Radiation Oncology Consultation  Name: Tyler [name] MRN: [id number]  Date: 08/13/2021  DOB: October 06, 1955  EZ:MOQHUT, Tyler Mars, MD  Ardis Hughs, MD   REFERRING PHYSICIAN: Ardis Hughs, MD  DIAGNOSIS: 66 y.o. gentleman with stage T1c adenocarcinoma of the prostate with a Gleason's score of 4+3 and a PSA of 5.38    ICD-10-CM   1. Prostate cancer ([place])  C61       HISTORY OF PRESENT ILLNESS::Tyler [name] is a 66 y.o. gentleman. He longstanding history of chronic prostatitis, previously followed by Dr. Risa Grill since at least 2015.  He is an avid cyclist and has had a gradual rise in his PSA over the years, but in the normal range between 2-3 until recent.  He was referred to Dr. Louis Meckel for evaluation of the rising PSA in 10/2019. At that time, he had prostatitis symptoms and was treated with antibiotics. PSA in 05/2020 was 3.36. When he returned a year later in 05/2021, his PSA had increased to 5.38. Digital rectal examination was performed at that time revealing bilateral lobe firmness. He underwent prostate MRI on 06/18/21 showing a PI-RADS 4 lesion in the left anterior peripheral zone, with adjacent left anterior fibromuscular stroma at the apex. The patient proceeded to MRI fusion transrectal ultrasound with 15 biopsies of the prostate on 07/05/21.  The prostate volume measured 41.4 cc.  Out of 15 core biopsies, 3 were positive.  The maximum Gleason score was 4+3, and this was seen in the right base lateral. Additionally, Gleason 3+4 was seen in two samples from the MRI ROI.  The patient reviewed the biopsy results with his urologist and he has kindly been referred today to the multidisciplinary prostate cancer clinic for presentation of pathology and radiology studies in our conference for discussion of potential radiation treatment options and  clinical evaluation.    PREVIOUS RADIATION THERAPY: No  PAST MEDICAL HISTORY:  has a past medical history of Allergy, Anxiety, Asthma, Cataract, Colon polyps, GERD (gastroesophageal reflux disease), Hematuria (10/05/2013), Hemorrhoids, Hyperlipidemia, IBS (irritable bowel syndrome), and Rosacea.    PAST SURGICAL HISTORY: Past Surgical History:  Procedure Laterality Date   COLONOSCOPY  07/28/2006    FAMILY HISTORY: family history includes Breast cancer in his mother and paternal aunt; Coronary artery disease in his mother; Coronary artery disease (age of onset: 13) in his father; Glaucoma in his mother; Prostate cancer in his paternal grandfather; Stomach cancer in his maternal uncle.  SOCIAL HISTORY:  reports that he has quit smoking. His smoking use included cigarettes. He has a 3.00 pack-year smoking history. He has never used smokeless tobacco. He reports current alcohol use. He reports that he does not use drugs.  ALLERGIES: Penicillins  MEDICATIONS:  Current Outpatient Medications  Medication Sig Dispense Refill   ALPRAZolam (XANAX) 0.25 MG tablet Take 1 tablet (0.25 mg total) by mouth 2 (two) times daily as needed for anxiety (do not drive for 6-8 hours after taking). 30 tablet 2   bisoprolol (ZEBETA) 5 MG tablet TAKE 1/2 TABLET(2.5 MG) BY MOUTH DAILY 45 tablet 3   busPIRone (BUSPAR) 5 MG tablet Take 1 tablet (5 mg total) by mouth 2 (two) times daily. (Patient taking differently: Take 5 mg by mouth daily.) 60 tablet 5   diphenhydramine-acetaminophen (TYLENOL PM) 25-500 MG TABS tablet Take 1 tablet by mouth at bedtime.     Docusate Calcium (STOOL  SOFTENER PO) Take by mouth.     FIBER PO Take by mouth.     fluticasone (FLONASE) 50 MCG/ACT nasal spray Place 2 sprays into both nostrils daily. 16 g 6   glycopyrrolate (ROBINUL) 2 MG tablet TAKE 1 TABLET(2 MG) BY MOUTH TWICE DAILY AS NEEDED 180 tablet 1   HYDROcodone bit-homatropine (HYCODAN) 5-1.5 MG/5ML syrup Take 5 mLs by mouth every 8  (eight) hours as needed for cough (do not drive for 8 hours after taking). (Patient not taking: Reported on 08/13/2021) 120 mL 0   meloxicam (MOBIC) 7.5 MG tablet Take 7.5 mg by mouth daily as needed.     multivitamin (THERAGRAN) per tablet Take 1 tablet by mouth daily.     Omega-3 Fatty Acids (FISH OIL) 1000 MG CAPS Take 2 capsules by mouth daily. (Patient not taking: Reported on 05/18/2021)     omeprazole (PRILOSEC) 20 MG capsule Take 1 capsule (20 mg total) by mouth daily. 90 capsule 3   Pramoxine-HC (HYDROCORTISONE ACE-PRAMOXINE) 2.5-1 % CREA Apply 1 application topically 2 (two) times daily as needed. 30 g 0   Probiotic Product (PROBIOTIC DAILY PO) Take 1 capsule by mouth daily.     rosuvastatin (CRESTOR) 20 MG tablet TAKE 1 TABLET(20 MG) BY MOUTH 1 TIME A WEEK 14 tablet 1   tamsulosin (FLOMAX) 0.4 MG CAPS capsule Take 0.4 mg by mouth daily.     No current facility-administered medications for this encounter.    REVIEW OF SYSTEMS:  On review of systems, the patient reports that he is doing well overall. He denies any chest pain, shortness of breath, cough, fevers, chills, night sweats, unintended weight changes. He denies any bowel disturbances, and denies abdominal pain, nausea or vomiting. He denies any new musculoskeletal or joint aches or pains. His IPSS was 6, indicating mild urinary symptoms. His SHIM was 24, indicating he does not have erectile dysfunction. A complete review of systems is obtained and is otherwise negative.   PHYSICAL EXAM:  Wt Readings from Last 3 Encounters:  08/13/21 151 lb (68.5 kg)  03/29/21 146 lb 9.6 oz (66.5 kg)  12/01/20 153 lb 7 oz (69.6 kg)   Temp Readings from Last 3 Encounters:  08/13/21 97.8 F (36.6 C)  03/29/21 98.4 F (36.9 C) (Temporal)  09/15/20 98 F (36.7 C) (Temporal)   BP Readings from Last 3 Encounters:  08/13/21 (!) 149/90  03/29/21 139/83  09/15/20 138/80   Pulse Readings from Last 3 Encounters:  08/13/21 83  03/29/21 80   09/15/20 74    /10  In general this is a well appearing Caucasian male in no acute distress. He's alert and oriented x4 and appropriate throughout the examination. Cardiopulmonary assessment is negative for acute distress and he exhibits normal effort.    KPS = 100  100 - Normal; no complaints; no evidence of disease. 90   - Able to carry on normal activity; minor signs or symptoms of disease. 80   - Normal activity with effort; some signs or symptoms of disease. 71   - Cares for self; unable to carry on normal activity or to do active work. 60   - Requires occasional assistance, but is able to care for most of his personal needs. 50   - Requires considerable assistance and frequent medical care. 93   - Disabled; requires special care and assistance. 89   - Severely disabled; hospital admission is indicated although death not imminent. 23   - Very sick; hospital admission necessary; active  supportive treatment necessary. 10   - Moribund; fatal processes progressing rapidly. 0     - Dead  Karnofsky DA, Abelmann [place], Craver LS and [facility] [phone]) The use of the nitrogen mustards in the palliative treatment of carcinoma: with particular reference to bronchogenic carcinoma Cancer 1 634-56   LABORATORY DATA:  Lab Results  Component Value Date   WBC 9.2 03/29/2021   HGB 16.1 03/29/2021   HCT 47.7 03/29/2021   MCV 87.4 03/29/2021   PLT 294.0 03/29/2021   Lab Results  Component Value Date   NA 139 03/29/2021   K 3.9 03/29/2021   CL 105 03/29/2021   CO2 23 03/29/2021   Lab Results  Component Value Date   ALT 17 03/29/2021   AST 17 03/29/2021   ALKPHOS 46 03/29/2021   BILITOT 0.5 03/29/2021     RADIOGRAPHY: No results found.    IMPRESSION/PLAN: 66 y.o. gentleman with Stage T1c adenocarcinoma of the prostate with a Gleason score of 4+3 and a PSA of 5.38.    We discussed the patient's workup and outlined the nature of prostate cancer in this setting. The patient's T stage,  Gleason's score, and PSA put him into the unfavorable intermediate risk group. Accordingly, he is eligible for a variety of potential treatment options including brachytherapy, 5.5 weeks of external radiation, or prostatectomy. We discussed the available radiation techniques, and focused on the details and logistics of delivery. We discussed and outlined the risks, benefits, short and long-term effects associated with radiotherapy and compared and contrasted these with prostatectomy. We discussed the role of SpaceOAR gel in reducing the rectal toxicity associated with radiotherapy.   He focused most of his questions and interest in robotic-assisted laparoscopic radical prostatectomy.  We discussed some of the potential advantages of surgery including surgical staging, the availability of salvage radiotherapy to the prostatic fossa, and the confidence associated with immediate biochemical response. We discussed some of the potential proven indications for postoperative radiotherapy including positive margins, extracapsular extension, and seminal vesicle involvement. We also talked about some of the other potential findings leading to a recommendation for radiotherapy including a non-zero postoperative PSA and positive lymph nodes.   He appears to have a good understanding of his disease and our treatment recommendations which are of curative intent.  He was encouraged to ask questions that were answered to his stated satisfaction.  At the end of the conversation the patient is undecided regarding his treatment preference but appears to be leaning towards prostatectomy.  He will discuss this further with Dr. Alinda Money as part of our multidisciplinary prostate clinic today.  We will share our discussion with Dr. Louis Meckel and will look forward to following along in his care.  We enjoyed meeting him and his wife today and of course, we are more than happy to continue to participate in his care if he should ultimately  decide to proceed with radiotherapy or if ever indicated in the future.  He knows that he is welcome to call at anytime with any questions or concerns related to radiation.   We personally spent 60 minutes in this encounter including chart review, reviewing radiological studies, meeting face-to-face with the patient, entering orders and completing documentation.    Nicholos Johns, PA-C    Tyler Pita, MD  [facility] Direct Dial: [phone]   Fax: [phone] [email or web address]   Skype   [email or web address]   This document serves as a record of services personally performed by Tyler Pita,  MD and Freeman Caldron, PA-C. It was created on their behalf by Wilburn Mylar, a trained medical scribe. The creation of this record is based on the scribe's personal observations and the provider's statements to them. This document has been checked and approved by the attending provider.

## 2021-08-13 NOTE — Progress Notes (Signed)
REFERRING PROVIDER: Zola Button, MD [place], [place] [address]  PRIMARY PROVIDER:  Marin Olp, MD  PRIMARY REASON FOR VISIT:  1. Prostate cancer ([place])   2. Family history of breast cancer   3. Family history of prostate cancer     HISTORY OF PRESENT ILLNESS:   Tyler [name], a 66 y.o. male, was seen for a [place] cancer genetics consultation at the request of Dr. Alen Blew due to a personal and family history of cancer.  Tyler [name] presents to clinic today to discuss the possibility of a hereditary predisposition to cancer, to discuss genetic testing, and to further clarify his future cancer risks, as well as potential cancer risks for family members.   In December 2022, at the age of 26, Tyler [name] was diagnosed with prostate cancer (Gleason score: 7).   CANCER HISTORY:  Oncology History  Prostate cancer ([place])  08/13/2021 Initial Diagnosis   Prostate cancer ([place])   08/13/2021 Cancer Staging   Staging form: Prostate, AJCC 8th Edition - Clinical: Stage IIC (cT1c, cN0, cM0, PSA: 5.4, Grade Group: 3) - Signed by Wyatt Portela, MD on 08/13/2021 Prostate specific antigen (PSA) range: Less than 10 Gleason score: 7 Histologic grading system: 5 grade system      Past Medical History:  Diagnosis Date   Allergy    Anxiety    Asthma    Cataract    beginning   Colon polyps    GERD (gastroesophageal reflux disease)    Hematuria 10/05/2013   Hemorrhoids    Hyperlipidemia    IBS (irritable bowel syndrome)    alt diarrhea/ constipation   Rosacea     Past Surgical History:  Procedure Laterality Date   COLONOSCOPY  07/28/2006    Social History   Socioeconomic History   Marital status: Married    Spouse name: Not on file   Number of children: Not on file   Years of education: Not on file   Highest education level: Not on file  Occupational History   Not on file  Tobacco Use   Smoking status: Former    Packs/day: 0.75    Years: 4.00     Pack years: 3.00    Types: Cigarettes   Smokeless tobacco: Never  Substance and Sexual Activity   Alcohol use: Yes    Alcohol/week: 0.0 - 3.0 standard drinks    Comment: social   Drug use: No   Sexual activity: Not on file  Other Topics Concern   Not on file  Social History Narrative   Married (wife plans to transition to me 2020, see mother in law as well Nickola Major), 3 kids. 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 1 of his children is adopted.       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       Hobbies: biking, cycling, reading, movies   Social Determinants of Radio broadcast assistant Strain: Not on file  Food Insecurity: Not on file  Transportation Needs: Not on file  Physical Activity: Not on file  Stress: Not on file  Social Connections: Not on file     FAMILY HISTORY:  We obtained a detailed, 4-generation family history.  Significant diagnoses are listed below: Family History  Problem Relation Age of Onset   Breast cancer Mother        dx. 60s   Stomach cancer  Maternal Uncle    Breast cancer Paternal Aunt    Prostate cancer Paternal Grandfather        dx. 49s     Tyler [name] mother was diagnosed with breast cancer in her 25s, she died at 41. His maternal uncle was diagnosed with stomach cancer at an unknown age, he is deceased. A second maternal uncle was diagnosed with cancer (unknown type) at an unknown age, he is deceased. His paternal aunt was diagnosed with breast cancer at an unknown age, she is deceased. His paternal grandfather was diagnosed with prostate cancer in his 11s, he is deceased. Tyler [name] is unaware of previous family history of genetic testing for hereditary cancer risks.   GENETIC COUNSELING ASSESSMENT: Tyler [name] is a 66 y.o. male with a personal and family history of cancer which is somewhat suggestive of a  hereditary predisposition to cancer. We, therefore, discussed and recommended the following at today's visit.   DISCUSSION: We discussed that 5 - 10% of cancer is hereditary, with most cases of prostate cancer associated with BRCA1/2 and HOXB13.  There are other genes that can be associated with hereditary prostate cancer syndromes.  We discussed that testing is beneficial for several reasons including knowing how to follow individuals after completing their treatment and understanding if other family members could be at risk for cancer and allowing them to undergo genetic testing.   We reviewed the characteristics, features and inheritance patterns of hereditary cancer syndromes. We also discussed genetic testing, including the appropriate family members to test, the process of testing, insurance coverage and turn-around-time for results. We discussed the implications of a negative, positive, carrier and/or variant of uncertain significant result. We recommended Tyler [name] pursue genetic testing for a panel that includes genes associated with prostate cancer.   Tyler [name] was offered a common hereditary cancer panel (47 genes) and an expanded pan-cancer panel (84 genes). Tyler [name] was informed of the benefits and limitations of each panel, including that expanded pan-cancer panels contain genes that do not have clear management guidelines at this point in time.  We also discussed that as the number of genes included on a panel increases, the chances of variants of uncertain significance increases.  After considering the benefits and limitations of each gene panel, Tyler [name] elected to have [facility]'s CancerNext Panel+RNA.  The CancerNext gene panel offered by [facility] [facility] includes sequencing, rearrangement analysis, and RNA analysis for the following 36 genes:   APC, ATM, AXIN2, BARD1, BMPR1A, BRCA1, BRCA2, BRIP1, CDH1, CDK4, CDKN2A, CHEK2, DICER1, HOXB13, EPCAM, GREM1, MLH1, MSH2, MSH3, MSH6,  MUTYH, NBN, NF1, NTHL1, PALB2, PMS2, POLD1, POLE, PTEN, RAD51C, RAD51D, RECQL, SMAD4, SMARCA4, STK11, and TP53.    Based on Tyler [name] personal and family history of cancer, he meets medical criteria for genetic testing. Despite that he meets criteria, he may still have an out of pocket cost. We discussed that if his out of pocket cost for testing is over $100, the laboratory will call and confirm whether he wants to proceed with testing.  If the out of pocket cost of testing is less than $100 he will be billed by the genetic testing laboratory.   PLAN: After considering the risks, benefits, and limitations, Tyler [name] provided informed consent to pursue genetic testing and the blood sample was sent to [facility] for analysis of the CancerNext Panel. Results should be available within approximately 2-3 weeks' time, at which point they will be disclosed by telephone to Tyler [name], as will  any additional recommendations warranted by these results. Tyler [name] will receive a summary of his genetic counseling visit and a copy of his results once available. This information will also be available in Epic.   Tyler [name] questions were answered to his satisfaction today. Our contact information was provided should additional questions or concerns arise. Thank you for the referral and allowing [place] to share in the care of your patient.   Lucille Passy, MS, [facility] Genetic Counselor [email or web address] (P) [phone]  The patient was seen for a total of 15 minutes in face-to-face genetic counseling.  The patient brought his wife. Dr. Alen Blew was available to discuss this case as needed.   _______________________________________________________________________ For Office Staff:  Number of people involved in session: 2 Was an Intern/ student involved with case: no

## 2021-08-13 NOTE — Progress Notes (Signed)
Reason for the request: Prostate cancer  HPI: I was asked by Dr. Louis Meckel to evaluate Tyler [name] for the evaluation of prostate cancer.  He is a 66 year old gentleman without any significant comorbid conditions was found to have elevated PSA by his primary care physician.  His PSA in October 2021 was 3.36.  In October 2022 was 5.38.  Based on these findings he was evaluated by Dr. Louis Meckel and underwent MRI in November 2022.  The MRI showed a PI-RADS category 4 lesion in the left anterior peripheral zone.  He underwent a prostate biopsy in December 2022 which showed a Gleason score 4+3 = 7 with 30% in that core and a 3+4 = 7 5% in target biopsy core.  Clinically, he reports feeling reasonably well without any major urinary symptoms.  He denies any nocturia or dysuria.  He does report occasional incomplete emptying.  He denies any decline in his energy or performance status.   He does not report any headaches, blurry vision, syncope or seizures. Does not report any fevers, chills or sweats.  Does not report any cough, wheezing or hemoptysis.  Does not report any chest pain, palpitation, orthopnea or leg edema.  Does not report any nausea, vomiting or abdominal pain.  Does not report any constipation or diarrhea.  Does not report any skeletal complaints.    Does not report frequency, urgency or hematuria.  Does not report any skin rashes or lesions. Does not report any heat or cold intolerance.  Does not report any lymphadenopathy or petechiae.  Does not report any anxiety or depression.  Remaining review of systems is negative.     Past Medical History:  Diagnosis Date   Allergy    Anxiety    Asthma    Cataract    beginning   Colon polyps    GERD (gastroesophageal reflux disease)    Hematuria 10/05/2013   Hemorrhoids    Hyperlipidemia    IBS (irritable bowel syndrome)    alt diarrhea/ constipation   Rosacea   :   Past Surgical History:  Procedure Laterality Date   COLONOSCOPY  07/28/2006   :   Current Outpatient Medications:    ALPRAZolam (XANAX) 0.25 MG tablet, Take 1 tablet (0.25 mg total) by mouth 2 (two) times daily as needed for anxiety (do not drive for 6-8 hours after taking)., Disp: 30 tablet, Rfl: 2   bisoprolol (ZEBETA) 5 MG tablet, TAKE 1/2 TABLET(2.5 MG) BY MOUTH DAILY, Disp: 45 tablet, Rfl: 3   busPIRone (BUSPAR) 5 MG tablet, Take 1 tablet (5 mg total) by mouth 2 (two) times daily. (Patient taking differently: Take 5 mg by mouth daily.), Disp: 60 tablet, Rfl: 5   diphenhydramine-acetaminophen (TYLENOL PM) 25-500 MG TABS tablet, Take 1 tablet by mouth at bedtime., Disp: , Rfl:    Docusate Calcium (STOOL SOFTENER PO), Take by mouth., Disp: , Rfl:    FIBER PO, Take by mouth., Disp: , Rfl:    meloxicam (MOBIC) 7.5 MG tablet, Take 7.5 mg by mouth daily as needed., Disp: , Rfl:    multivitamin (THERAGRAN) per tablet, Take 1 tablet by mouth daily., Disp: , Rfl:    omeprazole (PRILOSEC) 20 MG capsule, Take 1 capsule (20 mg total) by mouth daily., Disp: 90 capsule, Rfl: 3   Probiotic Product (PROBIOTIC DAILY PO), Take 1 capsule by mouth daily., Disp: , Rfl:    rosuvastatin (CRESTOR) 20 MG tablet, TAKE 1 TABLET(20 MG) BY MOUTH 1 TIME A WEEK, Disp: 14 tablet, Rfl: 1  tamsulosin (FLOMAX) 0.4 MG CAPS capsule, Take 0.4 mg by mouth daily., Disp: , Rfl:    fluticasone (FLONASE) 50 MCG/ACT nasal spray, Place 2 sprays into both nostrils daily., Disp: 16 g, Rfl: 6   glycopyrrolate (ROBINUL) 2 MG tablet, TAKE 1 TABLET(2 MG) BY MOUTH TWICE DAILY AS NEEDED, Disp: 180 tablet, Rfl: 1   HYDROcodone bit-homatropine (HYCODAN) 5-1.5 MG/5ML syrup, Take 5 mLs by mouth every 8 (eight) hours as needed for cough (do not drive for 8 hours after taking). (Patient not taking: Reported on 08/13/2021), Disp: 120 mL, Rfl: 0   Omega-3 Fatty Acids (FISH OIL) 1000 MG CAPS, Take 2 capsules by mouth daily. (Patient not taking: Reported on 05/18/2021), Disp: , Rfl:    Pramoxine-HC (HYDROCORTISONE ACE-PRAMOXINE)  2.5-1 % CREA, Apply 1 application topically 2 (two) times daily as needed., Disp: 30 g, Rfl: 0:   Allergies  Allergen Reactions   Penicillins   :   Family History  Problem Relation Age of Onset   Glaucoma Mother        and father   Coronary artery disease Mother        late 58s-cabg. died at 45   Breast cancer Mother    Coronary artery disease Father 60       death 45, presumed due to MI as sudden   Stomach cancer Maternal Uncle    Colon cancer Neg Hx    Colon polyps Neg Hx    Esophageal cancer Neg Hx    Rectal cancer Neg Hx   :   Social History   Socioeconomic History   Marital status: Married    Spouse name: Not on file   Number of children: Not on file   Years of education: Not on file   Highest education level: Not on file  Occupational History   Not on file  Tobacco Use   Smoking status: Former    Packs/day: 0.75    Years: 4.00    Pack years: 3.00    Types: Cigarettes   Smokeless tobacco: Never  Substance and Sexual Activity   Alcohol use: Yes    Alcohol/week: 0.0 - 3.0 standard drinks    Comment: social   Drug use: No   Sexual activity: Not on file  Other Topics Concern   Not on file  Social History Narrative   Married (wife plans to transition to me 2020, see mother in law as well Nickola Major), 3 kids. 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 1 of his children is adopted.       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       Hobbies: biking, cycling, reading, movies   Social Determinants of Health   Financial Resource Strain: Not on file  Food Insecurity: Not on file  Transportation Needs: Not on file  Physical Activity: Not on file  Stress: Not on file  Social Connections: Not on file  Intimate Partner Violence: Not on file  :    Assessment and Plan:   67 year old with prostate cancer diagnosed in December 2022.  He was  found to have Gleason score 4+3 = 7 and a PSA of 5.38.  His case was discussed today in the prostate cancer multidisciplinary clinic and treatment choices were reviewed.  His pathology results as well as imaging studies were reviewed with the reviewing radiology and pathology specialist.  His treatment choices including primary  surgical therapy versus radiation therapy for his intermediate risk disease.  Risks and benefits of both approaches were reviewed as well as the role for systemic therapy.  At this time I see no specific role related to his stage of cancer for any additional systemic therapy.  He understands also that treatment options are equivalent and he would be an excellent candidate for either of those approaches.  He will consider both those options and make a decision in near future.  I have also recommended genetic counseling given his family history of breast cancer.  All his questions were answered to his satisfaction.   30  minutes were dedicated to this visit. The time was spent on reviewing pathology results, laboratory data, imaging studies, discussing treatment options, and answering questions regarding future plan.    A copy of this consult has been forwarded to the requesting physician.

## 2021-08-13 NOTE — Progress Notes (Signed)
[facility] Psychosocial Distress Screening Spiritual Care  Met with Tyler [name] and his wife Tyler [name] in [facility] to introduce [facility] team/resources, reviewing distress screen per protocol.  The patient scored a 7 on the Psychosocial Distress Thermometer which indicates severe distress. Also assessed for distress and other psychosocial needs.   ONCBCN DISTRESS SCREENING 08/13/2021  Screening Type Initial Screening  Distress experienced in past week (1-10) 7  Emotional problem type Nervousness/Anxiety;Adjusting to illness  Physical Problem type Loss of appetitie  Referral to support programs Yes   [name] reports no barriers to treatment, just expected distress related to diagnosis and treatment. Provided empathic listening, normalization of feelings, and encouragement to explore [name] support programming.   Follow up needed: No. Added [name] to [facility] email list per his request. He reports no other needs at this time, but knows to contact team/chaplain if needs arise or circumstances change.   [place], [place], [facility] Pager [phone] Voicemail [phone]

## 2021-08-13 NOTE — Consult Note (Signed)
[name]     08/13/2021   --------------------------------------------------------------------------------   Tyler Tyler [name]. Tyler Tyler [name]  MRN: [id number]  DOB: 12-Jun-1956, 66 year old Male  SSN: [id number]   PRIMARY CARE:  Tyler Tyler [name]. Tyler Spry, MD  REFERRING:  Tyler Hughs, MD  PROVIDER:  Louis Tyler [name], M.D.  TREATING:  Tyler Tyler [name], M.D.  LOCATION:  [facility] [phone]     --------------------------------------------------------------------------------   CC/HPI: CC: Prostate Cancer   Physician requesting consult: Dr. Burman Tyler [name]  PCP: Dr. 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 He has a history of prostatitis and a fluctuating PSA that was monitored but significantly increased in Tyler absence of symptoms of prostatitis. His PSA was 5.38 in October of 2022 and he underwent an MRI of Tyler prostate on 06/18/21 that demonstrated a PI-RADS 4 lesion of Tyler left anterior apex. He underwent an MR/US fusion biopsy on 07/05/21 and this indicated Gleason 4+3=7 adenocarcinoma with 2 out of 3 targeted biopsies positive and 1 out of 12 systematic biopsies positive.   Family history: None.   Imaging studies:  MRI (06/18/21): No EPE, SVI, LAD, and bone lesions.   PMH: He has a history of hypertension, hyperlipidemia, and GERD. He has an allergy to PCN which has caused mouth sores.  PSH: No abdominal surgeries.   TNM stage: cT1c N0 Mx  PSA: 5.38  Gleason score: 4+3=7 (GG 3)  Biopsy (07/05/21): 3/15 cores positive  Left: Benign  Right: R lateral base (30%, 4+3=7)  Targeted: 2/3 cores (3+4=7, 5%, < 5%)  Prostate volume: 41.4 cc   Nomogram  OC disease: 63%  EPE: 34%  SVI: 6%  LNI: 6%  PFS (5 year, 10 year): 72%, 57%   Urinary function: IPSS is 6.  Erectile  function: SHIM score is 24.     ALLERGIES: Penicillins - Mouth sores    MEDICATIONS: Tamsulosin Hcl 0.4 mg capsule 1 capsule PO Daily  Bisoprolol Fumarate 5 MG Oral Tablet 0 Oral  Buspirone Hcl  Crestor 20 mg tablet 0 Oral  Fiber TABS Oral  Fish Oil CAPS Oral  Glycopyrrolate 2 mg tablet 0 Oral  Multi-Day tablet Oral  Stool Softener 100 mg tablet Oral  Tylenol PM Extra Strength TABS Oral     GU PSH: Prostate Needle Biopsy - 07/05/2021       PSH Notes: History Of Prior Surgery, PT for shoulder   NON-GU PSH: Surgical Pathology, Gross And Microscopic Examination For Prostate Needle - 07/05/2021     GU PMH: Prostate Cancer - 07/21/2021 Elevated PSA - 07/05/2021, - 05/24/2021, - 05/19/2020, - 05/21/2019 Post-void dribbling - 05/24/2021, (Stable), - 05/19/2020, - 05/21/2019 Chronic prostatitis - 05/19/2020, Prostatitis, chronic, - 2017 BPH w/o LUTS, Benign enlargement of prostate - 2017 Urinary Frequency, Increased urinary frequency - 2017    NON-GU PMH: Sebaceous cyst - 2018 Encounter for general adult medical examination without abnormal findings, Encounter for preventive health examination - 2017 Personal history of other specified conditions, History of heartburn - 2015    FAMILY HISTORY: cardiac disorder - Runs In Family No Significant Family History - Runs In Family   SOCIAL HISTORY: Marital Status: Married Preferred Language: English; Ethnicity: Not Hispanic Or Latino; Race: White Current Smoking Status: Tyler [name] has never smoked.   Tobacco Use Assessment Completed: Used Tobacco in last 30 days? Does  drink.  Does not drink caffeine. 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     Notes: Former smoker, Married, Occupation, Mother's age, Caffeine use, Three children, Alcohol use, Father deceased   REVIEW OF SYSTEMS:    GU Review Male:   Tyler [name] denies frequent urination, hard to postpone urination, burning/ pain with urination, get up at night to urinate, leakage of  urine, stream starts and stops, trouble starting your streams, and have to strain to urinate .  Gastrointestinal (Lower):   Tyler [name] denies diarrhea and constipation.  Gastrointestinal (Upper):   Tyler [name] denies nausea and vomiting.  Constitutional:   Tyler [name] denies fever, night sweats, weight loss, and fatigue.  Skin:   Tyler [name] denies skin rash/ lesion and itching.  Eyes:   Tyler [name] denies blurred vision and double vision.  Ears/ Nose/ Throat:   Tyler [name] denies sore throat and sinus problems.  Hematologic/Lymphatic:   Tyler [name] denies swollen glands and easy bruising.  Cardiovascular:   Tyler [name] denies leg swelling and chest pains.  Respiratory:   Tyler [name] denies cough and shortness of breath.  Endocrine:   Tyler [name] denies excessive thirst.  Musculoskeletal:   Tyler [name] denies back pain and joint pain.  Neurological:   Tyler [name] denies headaches and dizziness.  Psychologic:   Tyler [name] denies depression and anxiety.   VITAL SIGNS: None   MULTI-SYSTEM PHYSICAL EXAMINATION:    Constitutional: Well-nourished. No physical deformities. Normally developed. Good grooming.      Complexity of Data:  Lab Test Review:   PSA  Records Review:   Pathology Reports, Previous Tyler [name] Records  X-Ray Review: MRI Prostate GSORAD: Reviewed Films.     05/19/21 05/14/20  PSA  Total PSA 5.38 ng/mL 3.36 ng/mL    PROCEDURES: None   ASSESSMENT:      ICD-10 Details  1 GU:   Prostate Cancer - C61    PLAN:           Document Letter(s):  Created for Tyler [name]: Clinical Summary         Notes:   1. Unfavorable intermediate risk prostate cancer: I had a detailed discussion with Tyler Tyler [name] and his wife today regarding treatment options for prostate cancer. He has seen Dr. Alen [name] and Dr. Tammi Tyler [name] earlier today. He is still considering all options and has been well-informed through his prior discussions with Dr. Louis Tyler [name] as well. Tyler Tyler [name] was counseled about Tyler natural history of prostate cancer and Tyler standard  treatment options that are available for prostate cancer. It was explained to him how his age and life expectancy, clinical stage, Gleason score/prognostic grade group, and PSA (and PSA density) affect his prognosis, Tyler decision to proceed with additional staging studies, as well as how that information influences recommended treatment strategies. We discussed Tyler roles for active surveillance, radiation therapy, surgical therapy, androgen deprivation, as well as ablative therapy and other investigational options for Tyler treatment of prostate cancer as appropriate to his individual cancer situation. We discussed Tyler risks and benefits of these options with regard to their impact on cancer control and also in terms of potential adverse events, complications, and impact on quality of life particularly related to urinary and sexual function. Tyler Tyler [name] was encouraged to ask questions throughout Tyler discussion today and all questions were answered to his stated satisfaction. In addition, Tyler Tyler [name] was provided with and/or directed to appropriate resources and literature for further education about prostate cancer and treatment options. We discussed surgical therapy for prostate cancer including Tyler different available surgical approaches. We discussed, in detail, Tyler risks and  expectations of surgery with regard to cancer control, urinary control, and erectile function as well as Tyler expected postoperative recovery process. Additional risks of surgery including but not limited to bleeding, infection, hernia formation, nerve damage, lymphocele formation, bowel/rectal injury potentially necessitating colostomy, damage to Tyler urinary tract resulting in urine leakage, urethral stricture, and Tyler cardiopulmonary risks such as myocardial infarction, stroke, death, venothromboembolism, etc. were explained. Tyler risk of open surgical conversion for robotic/laparoscopic prostatectomy was also discussed.   He is going to  further consider his treatment options before making a final decision. He would appear to be an appropriate candidate for a bilateral nerve sparing robot-assisted laparoscopic radical prostatectomy and bilateral pelvic lymphadenectomy. He was informed that he is in excellent hands with Dr. Louis Tyler [name] but that I would be happy to help him out if he had a specific preference. He will notify us of his final decision in Tyler near future.   CC: Dr. Garret Tyler [name]  Dr. Burman Tyler [name]  Dr. Tyler Pita  Dr. Zola Button        Next Appointment:      Next Appointment: 08/17/2021 07:45 AM    Appointment Type: Laboratory Appointment    Location: [facility] [phone]    Provider: Lab LAB    Reason for Visit: 3 mo PSA      E & M CODES: We spent 64 minutes dedicated to evaluation and management time, including face to face interaction, discussions on coordination of care, documentation, result review, and discussion with others as applicable.

## 2021-08-20 ENCOUNTER — Telehealth: Payer: Self-pay

## 2021-08-20 NOTE — Telephone Encounter (Signed)
Left message for call back to follow up from [facility] on 1/13.

## 2021-08-20 NOTE — Progress Notes (Signed)
Pt was presented at [facility] on 08/13/2021 for stage T1c adenocarcinoma of the prostate with a Gleason score of 4+3 and a PSA of 5.38.    RN followed up with patient regarding treatment decision.  Pt has decided to proceed with surgery with Dr. Louis Meckel.  MD's notified of decision, patient will be scheduled.   No further needs at this time.  RN encouraged patient to call with any questions or concerns that may arise.  Verbalized understanding.

## 2021-08-23 ENCOUNTER — Telehealth: Payer: Self-pay | Admitting: Genetic Counselor

## 2021-08-23 ENCOUNTER — Ambulatory Visit: Payer: Self-pay | Admitting: Genetic Counselor

## 2021-08-23 DIAGNOSIS — Z1379 Encounter for other screening for genetic and chromosomal anomalies: Secondary | ICD-10-CM | POA: Insufficient documentation

## 2021-08-23 NOTE — Telephone Encounter (Signed)
I contacted Mr. Lafond to discuss his genetic testing results. No pathogenic variants were identified in the 36 genes analyzed. Detailed clinic note to follow.  The test report has been scanned into EPIC and is located under the Molecular Pathology section of the Results Review tab.  A portion of the result report is included below for reference.   Lucille Passy, MS, [facility] Genetic Counselor [email or web address] (P) [phone]

## 2021-08-23 NOTE — Progress Notes (Signed)
HPI:   Mr. Kunath was previously seen in the [place] clinic due to a personal and family history of cancer and concerns regarding a hereditary predisposition to cancer. Please refer to our prior cancer genetics clinic note for more information regarding our discussion, assessment and recommendations, at the time. Mr. Calvert recent genetic test results were disclosed to him, as were recommendations warranted by these results. These results and recommendations are discussed in more detail below.  CANCER HISTORY:  Oncology History  Prostate cancer ([place])  07/05/2021 Cancer Staging   Staging form: Prostate, AJCC 8th Edition - Clinical stage from 07/05/2021: Stage IIC (cT1c, cN0, cM0, PSA: 5.4, Grade Group: 3) - Signed by Freeman Caldron, PA-C on 08/13/2021 Histopathologic type: Adenocarcinoma, NOS Stage prefix: Initial diagnosis Prostate specific antigen (PSA) range: Less than 10 Gleason primary pattern: 4 Gleason secondary pattern: 3 Gleason score: 7 Histologic grading system: 5 grade system Number of biopsy cores examined: 15 Number of biopsy cores positive: 3 Location of positive needle core biopsies: Both sides    08/13/2021 Initial Diagnosis   Prostate cancer ([place])     FAMILY HISTORY:  We obtained a detailed, 4-generation family history.  Significant diagnoses are listed below:      Family History  Problem Relation Age of Onset   Breast cancer Mother          dx. 43s   Stomach cancer Maternal Uncle     Breast cancer Paternal Aunt     Prostate cancer Paternal Grandfather          dx. 57s       Mr. Manthei mother was diagnosed with breast cancer in her 65s, she died at 31. His maternal uncle was diagnosed with stomach cancer at an unknown age, he is deceased. A second maternal uncle was diagnosed with cancer (unknown type) at an unknown age, he is deceased. His paternal aunt was diagnosed with breast cancer at an unknown age, she is deceased. His  paternal grandfather was diagnosed with prostate cancer in his 27s, he is deceased. Mr. Tremaine is unaware of previous family history of genetic testing for hereditary cancer risks.  GENETIC TEST RESULTS:  The [facility] CancerNext Panel found no pathogenic mutations.   The CancerNext gene panel offered by [employer] includes sequencing, rearrangement analysis, and RNA analysis for the following 36 genes:   APC, ATM, AXIN2, BARD1, BMPR1A, BRCA1, BRCA2, BRIP1, CDH1, CDK4, CDKN2A, CHEK2, DICER1, HOXB13, EPCAM, GREM1, MLH1, MSH2, MSH3, MSH6, MUTYH, NBN, NF1, NTHL1, PALB2, PMS2, POLD1, POLE, PTEN, RAD51C, RAD51D, RECQL, SMAD4, SMARCA4, STK11, and TP53.    The test report has been scanned into EPIC and is located under the Molecular Pathology section of the Results Review tab.  A portion of the result report is included below for reference. Genetic testing reported out on 08/21/2021.         Even though a pathogenic variant was not identified, possible explanations for his personal history of cancer may include: There may be no hereditary risk for cancer in the family. The cancers in Mr. Obst and/or his family may be due to other genetic or environmental factors. There may be a gene mutation in one of these genes that current testing methods cannot detect, but that chance is small. There could be another gene that has not yet been discovered, or that we have not yet tested, that is responsible for the cancer diagnoses in the family.   Therefore, it is important to remain in touch with  cancer genetics in the future so that we can continue to offer Mr. Schranz the most up to date genetic testing.   ADDITIONAL GENETIC TESTING:  We discussed with Mr. Poplaski that his genetic testing was fairly extensive.  If there are genes identified to increase cancer risk that can be analyzed in the future, we would be happy to discuss and coordinate this testing at that time.     CANCER SCREENING  RECOMMENDATIONS:  Mr. Callicott test result is considered negative (normal).  This means that we have not identified a hereditary cause for his personal and family history of cancer at this time.   An individual's cancer risk and medical management are not determined by genetic test results alone. Overall cancer risk assessment incorporates additional factors, including personal medical history, family history, and any available genetic information that may result in a personalized plan for cancer prevention and surveillance. Therefore, it is recommended he continue to follow the cancer management and screening guidelines provided by his oncology and primary healthcare provider.  RECOMMENDATIONS FOR FAMILY MEMBERS:   Since he did not inherit a mutation in a cancer predisposition gene included on this panel, his children could not have inherited a mutation from him in one of these genes.  FOLLOW-UP:  Cancer genetics is a rapidly advancing field and it is possible that new genetic tests will be appropriate for him and/or his family members in the future. We encouraged him to remain in contact with cancer genetics on an annual basis so we can update his personal and family histories and let him know of advances in cancer genetics that may benefit this family.   Our contact number was provided. Mr. Maqueda questions were answered to his satisfaction, and he knows he is welcome to call us at anytime with additional questions or concerns.   Lucille Passy, MS, [facility] Genetic Counselor [email or web address] (P) [phone]

## 2021-08-25 ENCOUNTER — Other Ambulatory Visit: Payer: Self-pay | Admitting: Urology

## 2021-08-27 NOTE — Progress Notes (Signed)
Oncology Nurse Navigator Documentation  Oncology Nurse Navigator Flowsheets 08/27/2021 08/20/2021 08/13/2021 08/06/2021 08/03/2021  Abnormal Finding Date - - 05/19/2021 - -  Confirmed Diagnosis Date - - 07/05/2021 - -  Diagnosis Status - - Confirmed Diagnosis Complete - -  Planned Course of Treatment Surgery Surgery (No Data) - -  Expected Surgery Date 09/23/2021 (No Data) - - -  Surgery Actual Start Date: 09/23/2021 - - - -  Navigator Follow Up Date: - 11/11/2021 08/20/2021 - -  Navigator Follow Up Reason: - MDC F/U MDC F/U - -  Navigator Location [facility]  Referral Date to RadOnc/MedOnc - - 07/29/2021 - -  Navigator Encounter Type Appt/Treatment Plan Review MDC Follow-up;Telephone Clinic/MDC Telephone Introductory Phone Call  Telephone Appt Confirmation/Clarification Clinic/MDC Follow-up;Outgoing Call;Asess Navigation Needs - Outgoing Call -  Multidisiplinary Clinic Date - - 08/13/2021 - 08/13/2021  Multidisiplinary Clinic Type - - Prostate - Prostate  Patient Visit Type - - Initial - -  Treatment Phase - Follow-up Pre-Tx/Tx Discussion - -  Barriers/Navigation Needs Coordination of Care Coordination of Care Coordination of Care;Education Coordination of Care Coordination of Care  Education - - Newly Diagnosed Cancer Education;Understanding Cancer/ Treatment Options - -  Interventions Coordination of Care Coordination of Care Coordination of Care;Education Coordination of Care Coordination of Care  Acuity Level 2-Minimal Needs (1-2 Barriers Identified) Level 2-Minimal Needs (1-2 Barriers Identified) Level 2-Minimal Needs (1-2 Barriers Identified) - -  Coordination of Care Appts Appts;Other Appts;Other Pathology Appts  Education Method - - Verbal;Written;Video - -  Time Spent with Patient 15 15 > [phone]

## 2021-09-02 NOTE — Progress Notes (Addendum)
Anesthesia Review:  PCP: DTR Garret Reddish - per pt physcial with MD on 09/21/21 .   Cardiologist : none   Chest x-ray : EKG : 09/07/21  Echo : Stress test: Cardiac Cath :  Activity level: can do a flgiht of stairswithout difficulty  Sleep Study/ CPAP : none  Fasting Blood Sugar :      / Checks Blood Sugar -- times a day:   Blood Thinner/ Instructions /Last Dose: ASA / Instructions/ Last Dose :   Covid test on 09/20/21 at 0800am  Cbc done 09/07/21 routed to Dr Louis Meckel,  Janett Billow Ward, [facility] made aware.

## 2021-09-02 NOTE — Progress Notes (Signed)
Covid test on 09/20/21.   Come thru main entrance at [facility] long.  Have a seat in the lobby on the right as you come thru the door.  Call [phone] and give them your name and let them know you are here for covid testing                 BLAIKE VICKERS  09/02/2021   Your procedure is scheduled on:    09/23/21   Report to [facility] Main  Entrance   Report to admitting at   [phone]     Call this number if you have problems the morning of surgery [phone]    Remember: Do not eat food , candy gum or mints :After Midnight. You may have clear liquids from midnight until __ 0545am  Clear liquid diet the day before surgery.  AT 12 noon day before surgery mix 1/2 of 238 g of Miralax with 32 ounces of gatorade.  Drink glass every 15-30 minutes until complete.   At 6pm mix 1/2 of 238 g of Miralax with 32 ounces of gatorade and drink every 15-30 minutes until complete.  Take 2 Colace at 12 noon with Gatorade and 2 Colace at 6 pm with Gatorade.   Colace tablets- 100 mg each  Fleets enema am of surgery    CLEAR LIQUID DIET   Foods Allowed                                                                       Coffee and tea, regular and decaf                              Plain Jell-O any favor except red or purple                                            Fruit ices (not with fruit pulp)                                      Iced Popsicles                                     Carbonated beverages, regular and diet                                    Cranberry, grape and apple juices Sports drinks like Gatorade Lightly seasoned clear broth or consume(fat free) Sugar   _____________________________________________________________________    BRUSH YOUR TEETH MORNING OF SURGERY AND RINSE YOUR MOUTH OUT, NO CHEWING GUM CANDY OR MINTS.     Take these medicines the morning of surgery with A SIP OF WATER:  bisoprolol, buspar, omeprazole, flomax   DO NOT TAKE ANY DIABETIC  MEDICATIONS DAY OF YOUR SURGERY  You may not have any metal on your body including hair pins and              piercings  Do not wear jewelry, make-up, lotions, powders or perfumes, deodorant             Do not wear nail polish on your fingernails.  Do not shave  48 hours prior to surgery.              Men may shave face and neck.   Do not bring valuables to the hospital. [place].  Contacts, dentures or bridgework may not be worn into surgery.  Leave suitcase in the car. After surgery it may be brought to your room.     Patients discharged the day of surgery will not be allowed to drive home. IF YOU ARE HAVING SURGERY AND GOING HOME THE SAME DAY, YOU MUST HAVE AN ADULT TO DRIVE YOU HOME AND BE WITH YOU FOR 24 HOURS. YOU MAY GO HOME BY TAXI OR UBER OR ORTHERWISE, BUT AN ADULT MUST ACCOMPANY YOU HOME AND STAY WITH YOU FOR 24 HOURS.  Name and phone number of your driver:  Special Instructions: N/A              Please read over the following fact sheets you were given: _____________________________________________________________________  [facility] - Preparing for Surgery Before surgery, you can play an important role.  Because skin is not sterile, your skin needs to be as free of germs as possible.  You can reduce the number of germs on your skin by washing with CHG (chlorahexidine gluconate) soap before surgery.  CHG is an antiseptic cleaner which kills germs and bonds with the skin to continue killing germs even after washing. Please DO NOT use if you have an allergy to CHG or antibacterial soaps.  If your skin becomes reddened/irritated stop using the CHG and inform your nurse when you arrive at Short Stay. Do not shave (including legs and underarms) for at least 48 hours prior to the first CHG shower.  You may shave your face/neck. Please follow these instructions carefully:  1.  Shower with CHG Soap the night  before surgery and the  morning of Surgery.  2.  If you choose to wash your hair, wash your hair first as usual with your  normal  shampoo.  3.  After you shampoo, rinse your hair and body thoroughly to remove the  shampoo.                           4.  Use CHG as you would any other liquid soap.  You can apply chg directly  to the skin and wash                       Gently with a scrungie or clean washcloth.  5.  Apply the CHG Soap to your body ONLY FROM THE NECK DOWN.   Do not use on face/ open                           Wound or open sores. Avoid contact with eyes, ears mouth and genitals (private parts).  Wash face,  Genitals (private parts) with your normal soap.             6.  Wash thoroughly, paying special attention to the area where your surgery  will be performed.  7.  Thoroughly rinse your body with warm water from the neck down.  8.  DO NOT shower/wash with your normal soap after using and rinsing off  the CHG Soap.                9.  Pat yourself dry with a clean towel.            10.  Wear clean pajamas.            11.  Place clean sheets on your bed the night of your first shower and do not  sleep with pets. Day of Surgery : Do not apply any lotions/deodorants the morning of surgery.  Please wear clean clothes to the hospital/surgery center.  FAILURE TO FOLLOW THESE INSTRUCTIONS MAY RESULT IN THE CANCELLATION OF YOUR SURGERY PATIENT SIGNATURE_________________________________  NURSE SIGNATURE__________________________________  ________________________________________________________________________

## 2021-09-07 ENCOUNTER — Encounter (HOSPITAL_COMMUNITY)
Admission: RE | Admit: 2021-09-07 | Discharge: 2021-09-07 | Disposition: A | Discharge status: Home / Self Care | Payer: Medicare Other | Source: Ambulatory Visit | Attending: Urology | Admitting: Urology

## 2021-09-07 ENCOUNTER — Other Ambulatory Visit: Payer: Self-pay

## 2021-09-07 ENCOUNTER — Encounter (HOSPITAL_COMMUNITY): Payer: Self-pay

## 2021-09-07 VITALS — BP 142/92 | HR 81 | Temp 98.1°F | Resp 16 | Ht 67.0 in | Wt 151.0 lb

## 2021-09-07 DIAGNOSIS — Z1389 Encounter for screening for other disorder: Secondary | ICD-10-CM | POA: Diagnosis not present

## 2021-09-07 DIAGNOSIS — Z01818 Encounter for other preprocedural examination: Secondary | ICD-10-CM

## 2021-09-07 HISTORY — DX: Essential (primary) hypertension: I10

## 2021-09-07 HISTORY — DX: Family history of other specified conditions: Z84.89

## 2021-09-07 HISTORY — DX: Depression, unspecified: F32.A

## 2021-09-07 LAB — COMPREHENSIVE METABOLIC PANEL
ALT: 18 U/L (ref 0–44)
AST: 21 U/L (ref 15–41)
Albumin: 4.1 g/dL (ref 3.5–5.0)
Alkaline Phosphatase: 44 U/L (ref 38–126)
Anion gap: 7 (ref 5–15)
BUN: 20 mg/dL (ref 8–23)
CO2: 25 mmol/L (ref 22–32)
Calcium: 9.1 mg/dL (ref 8.9–10.3)
Chloride: 105 mmol/L (ref 98–111)
Creatinine, Ser: 1 mg/dL (ref 0.61–1.24)
GFR, Estimated: >60 mL/min (ref >60)
Glucose, Bld: 101 mg/dL — ABNORMAL HIGH (ref 70–99)
Potassium: 3.4 mmol/L — ABNORMAL LOW (ref 3.5–5.1)
Sodium: 137 mmol/L (ref 135–145)
Total Bilirubin: 0.5 mg/dL (ref 0.3–1.2)
Total Protein: 7.1 g/dL (ref 6.5–8.1)

## 2021-09-07 LAB — CBC
HCT: 52.5 % — ABNORMAL HIGH (ref 39.0–52.0)
Hemoglobin: 17.1 g/dL — ABNORMAL HIGH (ref 13.0–17.0)
MCH: 29.1 pg (ref 26.0–34.0)
MCHC: 32.6 g/dL (ref 30.0–36.0)
MCV: 89.4 fL (ref 80.0–100.0)
Platelets: 264 10*3/uL (ref 150–400)
RBC: 5.87 MIL/uL — ABNORMAL HIGH (ref 4.22–5.81)
RDW: 13.3 % (ref 11.5–15.5)
WBC: 6.9 10*3/uL (ref 4.0–10.5)
nRBC: 0 % (ref 0.0–0.2)

## 2021-09-08 LAB — URINE CULTURE: Culture: NO GROWTH

## 2021-09-10 NOTE — Progress Notes (Incomplete)
Phone [phone] In person visit   Subjective:   Tyler [name] is a 66 y.o. year old very pleasant male patient who presents for/with See problem oriented charting No chief complaint on file.   This visit occurred during the SARS-CoV-2 public health emergency.  Safety protocols were in place, including screening questions prior to the visit, additional usage of staff PPE, and extensive cleaning of exam room while observing appropriate contact time as indicated for disinfecting solutions.   Past Medical History-  Patient Active Problem List   Diagnosis Date Noted   Genetic testing 08/23/2021   Prostate cancer ([place]) 08/13/2021   Family history of breast cancer 08/13/2021   Family history of prostate cancer 08/13/2021   Palpitations 11/22/2017   Memory difficulties 11/22/2017   Essential hypertension 11/21/2014   Allergic rhinitis 11/21/2014   BPH (benign prostatic hyperplasia) 11/21/2014   Former smoker 11/21/2014   Plantar fasciitis of left foot 10/22/2014   Metatarsalgia of both feet 09/26/2014   Hematuria 10/05/2013   External hemorrhoids 04/17/2008   Irritable bowel syndrome 04/17/2008   History of colonic polyps 04/16/2008   Anxiety state 12/27/2007   Asthma 12/27/2007   GERD 12/27/2007   Hyperlipidemia 10/26/2007   Rosacea 10/26/2007    Medications- reviewed and updated Current Outpatient Medications  Medication Sig Dispense Refill   ALPRAZolam (XANAX) 0.25 MG tablet Take 1 tablet (0.25 mg total) by mouth 2 (two) times daily as needed for anxiety (do not drive for 6-8 hours after taking). 30 tablet 2   bisoprolol (ZEBETA) 5 MG tablet TAKE 1/2 TABLET(2.5 MG) BY MOUTH DAILY 45 tablet 3   busPIRone (BUSPAR) 5 MG tablet Take 1 tablet (5 mg total) by mouth 2 (two) times daily. 60 tablet 5   diphenhydramine-acetaminophen (TYLENOL PM) 25-500 MG TABS tablet Take 1 tablet by mouth at bedtime.     docusate sodium (COLACE) 100 MG capsule Take 300 mg by mouth at bedtime.      FIBER PO Take 3 tablets by mouth daily.     fluticasone (FLONASE) 50 MCG/ACT nasal spray Place 2 sprays into both nostrils daily. (Patient taking differently: Place 2 sprays into both nostrils daily as needed for allergies.) 16 g 6   glycopyrrolate (ROBINUL) 2 MG tablet TAKE 1 TABLET(2 MG) BY MOUTH TWICE DAILY AS NEEDED 180 tablet 1   HYDROcodone bit-homatropine (HYCODAN) 5-1.5 MG/5ML syrup Take 5 mLs by mouth every 8 (eight) hours as needed for cough (do not drive for 8 hours after taking). (Patient not taking: Reported on 08/13/2021) 120 mL 0   meloxicam (MOBIC) 7.5 MG tablet Take 7.5 mg by mouth daily as needed for pain.     multivitamin (THERAGRAN) per tablet Take 1 tablet by mouth daily.     omeprazole (PRILOSEC) 20 MG capsule Take 1 capsule (20 mg total) by mouth daily. 90 capsule 3   Pramoxine-HC (HYDROCORTISONE ACE-PRAMOXINE) 2.5-1 % CREA Apply 1 application topically 2 (two) times daily as needed. 30 g 0   Probiotic Product (PROBIOTIC DAILY PO) Take 2 capsules by mouth daily. Chewables     rosuvastatin (CRESTOR) 20 MG tablet TAKE 1 TABLET(20 MG) BY MOUTH 1 TIME A WEEK 14 tablet 1   tamsulosin (FLOMAX) 0.4 MG CAPS capsule Take 0.4 mg by mouth daily.     No current facility-administered medications for this visit.     Objective:  There were no vitals taken for this visit. Gen: NAD, resting comfortably CV: RRR no murmurs rubs or gallops Lungs: CTAB no  crackles, wheeze, rhonchi Abdomen: soft/nontender/nondistended/normal bowel sounds. No rebound or guarding.  Ext: no edema Skin: warm, dry Neuro: grossly normal, moves all extremities  ***    Assessment and Plan   ***Seen by Urology 11/19/2019 started on Mobic and flomax f/u in 6 mo  # Night cramps in legs  S: 3 weeks of symptoms reported on 03/2021 visit. In middle of week has noted leg cramps though around ankle- can make it harder to sleep. Doesn't bother him in the day.  No help with mustard. Tried gatorade last week but  perhaps mild help.  -bar of soap didn't work -12 oz 3-4x a day A/P:  ***  #Unintentional  Weight loss S: Patient reported on 03/2021 visit on losing 7 pounds in last 5 to 6 weeks around the time. On our scales weight was down 7 pounds from last visit in May with covid.    Didn't have any issues until got back from vacation in July 2022. Noted decreased appetite at that time. Rapid at first but leveled off in last week or two. Had been around 145 on home scales. Still rode his bike. Did not go anywhere exotic or eat anything atypical- was only in [place], [place].    Has felt slightly more irritable, frustarated. PHQ9 of 5- scored one for feeling down, depressed, hopeless, scored 0 for little interest or pleasure in doing things. Less patience in last few weeks. Some changes at work that have increased stress. Also still had pain after a root canal last year 2021- some discomfort during the day with this- does not affect while eating or while sleeping   IBS symptoms variable- havent increased from baseline. More loose stools. Still doing fiber and stool softener.    Age-appropriate cancer screenings -Colon cancer screening- 05/27/16 with 10 year repeat -Lung Cancer screening- not a candidate -Skin cancer screening- follows with Dr. Pearline Cables of dermatology -Prostate cancer screening- PSA with Dr. Louis Meckel- sees again October  -we opted to try buspirone 5 mg with increased stressors at work-can start with just once daily and increase to twice daily if needed after 2 weeks. -If inflammatory markers are elevated could consider CT abdomen pelvis otherwise wait until that visit to decide  A/P:***   #hypertension typically with white coat element S: medication: bisoprolol 2.5 mg daily Home readings #s: *** BP Readings from Last 3 Encounters:  09/07/21 (!) 142/92  08/13/21 (!) 149/90  03/29/21 139/83  A/P: ***  # Anxiety S:Medication: Buspar 5 mg twice daily rx- doing once daily- mild improvement-  considered twice daily, -Also has Xanax 0.25 mg twice daily as needed for sparing use with flying/doctor visits/public speaking A/P: ***  #hyperlipidemia S: Medication:rosuvastatin 20 mg once a week  Lab Results  Component Value Date   CHOL 129 09/15/2020   HDL 42.40 09/15/2020   LDLCALC 69 09/15/2020   LDLDIRECT 152.5 11/18/2008   TRIG 88.0 09/15/2020   CHOLHDL 3 09/15/2020   A/P: ***  # GERD S:Medication: omeprazole 20 mg daily  B12 levels related to PPI use: Lab Results  Component Value Date   VITAMINB12 346 09/15/2020   A/P: ***    Health Maintenance Due  Topic Date Due   Zoster Vaccines- Shingrix (1 of 2) Never done   COVID-19 Vaccine (4 - Booster for Pfizer series) 09/25/2020   INFLUENZA VACCINE  03/01/2021   Pneumonia Vaccine 34+ Years old (2 - PCV) 09/15/2021   Recommended follow up: No follow-ups on file. Future Appointments  Date Time  Provider [place]  09/20/2021  8:00 AM [facility] None  09/21/2021  8:20 AM Marin Olp, MD [facility]    Lab/Order associations: No diagnosis found.  No orders of the defined types were placed in this encounter.   I,Jada Bradford,acting as a scribe for Garret Reddish, MD.,have documented all relevant documentation on the behalf of Garret Reddish, MD,as directed by  Garret Reddish, MD while in the presence of Garret Reddish, MD.  *** Return precautions advised.  Burnett Corrente

## 2021-09-16 ENCOUNTER — Encounter: Payer: Self-pay | Admitting: Family Medicine

## 2021-09-17 ENCOUNTER — Other Ambulatory Visit: Payer: Self-pay

## 2021-09-17 MED ORDER — ALPRAZOLAM 0.25 MG PO TABS: 0.2500 mg | ORAL_TABLET | Freq: Two times a day (BID) | ORAL | 2 refills | Status: DC | PRN

## 2021-09-17 NOTE — Telephone Encounter (Signed)
..   Encourage patient to contact the pharmacy for refills or they can request refills through [place]:  06/21/21  NEXT APPOINTMENT DATE: 01/06/22 MEDICATION:  alprazolam   Is the patient out of medication?   PHARMACY:  [facility] at [facility] [employer] patient know to contact pharmacy at the end of the day to make sure medication is ready.  Please notify patient to allow 48-72 hours to process  CLINICAL FILLS OUT ALL BELOW:   LAST REFILL:  QTY:  REFILL DATE:    OTHER COMMENTS:    Okay for refill?  Please advise

## 2021-09-20 ENCOUNTER — Encounter (HOSPITAL_COMMUNITY)
Admission: RE | Admit: 2021-09-20 | Discharge: 2021-09-20 | Disposition: A | Discharge status: Home / Self Care | Payer: Medicare Other | Source: Ambulatory Visit | Attending: Urology | Admitting: Urology

## 2021-09-20 ENCOUNTER — Other Ambulatory Visit: Payer: Self-pay

## 2021-09-20 DIAGNOSIS — Z01812 Encounter for preprocedural laboratory examination: Secondary | ICD-10-CM | POA: Insufficient documentation

## 2021-09-20 DIAGNOSIS — Z20822 Contact with and (suspected) exposure to covid-19: Secondary | ICD-10-CM | POA: Insufficient documentation

## 2021-09-20 LAB — SARS CORONAVIRUS 2 (TAT 6-24 HRS): SARS Coronavirus 2: NEGATIVE

## 2021-09-21 ENCOUNTER — Ambulatory Visit: Payer: Medicare Other | Admitting: Family Medicine

## 2021-09-21 DIAGNOSIS — K219 Gastro-esophageal reflux disease without esophagitis: Secondary | ICD-10-CM

## 2021-09-21 DIAGNOSIS — I10 Essential (primary) hypertension: Secondary | ICD-10-CM

## 2021-09-21 DIAGNOSIS — R634 Abnormal weight loss: Secondary | ICD-10-CM

## 2021-09-21 DIAGNOSIS — E785 Hyperlipidemia, unspecified: Secondary | ICD-10-CM

## 2021-09-21 DIAGNOSIS — F411 Generalized anxiety disorder: Secondary | ICD-10-CM

## 2021-09-22 NOTE — Anesthesia Preprocedure Evaluation (Addendum)
Anesthesia Evaluation  Patient identified by MRN, date of birth, ID band Patient awake    Reviewed: Allergy & Precautions, NPO status , Patient's Chart, lab work & pertinent test results  Airway Mallampati: I       Dental no notable dental hx.    Pulmonary former smoker,    Pulmonary exam normal        Cardiovascular hypertension, Pt. on home beta blockers Normal cardiovascular exam     Neuro/Psych Anxiety Depression    GI/Hepatic Neg liver ROS, GERD  Medicated,  Endo/Other  negative endocrine ROS  Renal/GU negative Renal ROS  negative genitourinary   Musculoskeletal negative musculoskeletal ROS (+)   Abdominal Normal abdominal exam  (+)   Peds  Hematology   Anesthesia Other Findings   Reproductive/Obstetrics                            Anesthesia Physical Anesthesia Plan  ASA: 2  Anesthesia Plan: General   Post-op Pain Management: Dilaudid IV   Induction: Intravenous  PONV Risk Score and Plan: 4 or greater and Ondansetron, Dexamethasone, Midazolam and Treatment may vary due to age or medical condition  Airway Management Planned: Oral ETT  Additional Equipment: None  Intra-op Plan:   Post-operative Plan: Extubation in OR  Informed Consent: I have reviewed the patients History and Physical, chart, labs and discussed the procedure including the risks, benefits and alternatives for the proposed anesthesia with the patient or authorized representative who has indicated his/her understanding and acceptance.     Dental advisory given  Plan Discussed with: CRNA  Anesthesia Plan Comments:        Anesthesia Quick Evaluation

## 2021-09-23 ENCOUNTER — Encounter (HOSPITAL_COMMUNITY)
Admission: RE | Disposition: A | Discharge status: Home / Self Care | Payer: Self-pay | Source: Ambulatory Visit | Attending: Urology

## 2021-09-23 ENCOUNTER — Encounter (HOSPITAL_COMMUNITY): Payer: Self-pay | Admitting: Urology

## 2021-09-23 ENCOUNTER — Other Ambulatory Visit: Payer: Self-pay

## 2021-09-23 ENCOUNTER — Observation Stay (HOSPITAL_COMMUNITY)
Admission: RE | Admit: 2021-09-23 | Discharge: 2021-09-24 | Disposition: A | Discharge status: Home / Self Care | Payer: Medicare Other | Source: Ambulatory Visit | Attending: Urology | Admitting: Urology

## 2021-09-23 ENCOUNTER — Ambulatory Visit (HOSPITAL_COMMUNITY): Payer: Medicare Other | Admitting: Physician Assistant

## 2021-09-23 ENCOUNTER — Ambulatory Visit (HOSPITAL_BASED_OUTPATIENT_CLINIC_OR_DEPARTMENT_OTHER): Payer: Medicare Other | Admitting: Anesthesiology

## 2021-09-23 DIAGNOSIS — Z8546 Personal history of malignant neoplasm of prostate: Secondary | ICD-10-CM | POA: Diagnosis present

## 2021-09-23 DIAGNOSIS — C61 Malignant neoplasm of prostate: Principal | ICD-10-CM | POA: Insufficient documentation

## 2021-09-23 DIAGNOSIS — I10 Essential (primary) hypertension: Secondary | ICD-10-CM | POA: Diagnosis not present

## 2021-09-23 DIAGNOSIS — Z79899 Other long term (current) drug therapy: Secondary | ICD-10-CM | POA: Insufficient documentation

## 2021-09-23 DIAGNOSIS — Z01818 Encounter for other preprocedural examination: Secondary | ICD-10-CM

## 2021-09-23 HISTORY — PX: ROBOT ASSISTED LAPAROSCOPIC RADICAL PROSTATECTOMY: SHX5141

## 2021-09-23 HISTORY — PX: LYMPH NODE DISSECTION: SHX5087

## 2021-09-23 LAB — HEMOGLOBIN AND HEMATOCRIT, BLOOD
HCT: 49.4 % (ref 39.0–52.0)
Hemoglobin: 16.1 g/dL (ref 13.0–17.0)

## 2021-09-23 LAB — ABO/RH: ABO/RH(D): B POS

## 2021-09-23 LAB — TYPE AND SCREEN
ABO/RH(D): B POS
Antibody Screen: NEGATIVE

## 2021-09-23 SURGERY — PROSTATECTOMY, RADICAL, ROBOT-ASSISTED, LAPAROSCOPIC
Anesthesia: General

## 2021-09-23 MED ORDER — PROPOFOL 10 MG/ML IV BOLUS
INTRAVENOUS | Status: AC
  Filled 2021-09-23: qty 20

## 2021-09-23 MED ORDER — ONDANSETRON HCL 4 MG/2ML IJ SOLN
INTRAMUSCULAR | Status: DC | PRN
  Administered 2021-09-23: 4 mg via INTRAVENOUS

## 2021-09-23 MED ORDER — FENTANYL CITRATE (PF) 100 MCG/2ML IJ SOLN
INTRAMUSCULAR | Status: AC
  Filled 2021-09-23: qty 2

## 2021-09-23 MED ORDER — ROCURONIUM BROMIDE 10 MG/ML (PF) SYRINGE
PREFILLED_SYRINGE | INTRAVENOUS | Status: AC
  Filled 2021-09-23: qty 10

## 2021-09-23 MED ORDER — BUSPIRONE HCL 5 MG PO TABS
5.0000 mg | ORAL_TABLET | Freq: Two times a day (BID) | ORAL | Status: DC
Start: 2021-09-23 — End: 2021-09-24
  Administered 2021-09-23 – 2021-09-24 (×2): 5 mg via ORAL
  Filled 2021-09-23 (×2): qty 1

## 2021-09-23 MED ORDER — KETOROLAC TROMETHAMINE 15 MG/ML IJ SOLN
15.0000 mg | Freq: Four times a day (QID) | INTRAMUSCULAR | Status: DC
Start: 2021-09-23 — End: 2021-09-24
  Administered 2021-09-23 – 2021-09-24 (×3): 15 mg via INTRAVENOUS
  Filled 2021-09-23 (×3): qty 1

## 2021-09-23 MED ORDER — TRAMADOL HCL 50 MG PO TABS: 50.0000 mg | ORAL_TABLET | Freq: Four times a day (QID) | ORAL | 0 refills | Status: DC | PRN

## 2021-09-23 MED ORDER — CEFAZOLIN SODIUM-DEXTROSE 1-4 GM/50ML-% IV SOLN
1.0000 g | Freq: Three times a day (TID) | INTRAVENOUS | Status: DC
Start: 2021-09-23 — End: 2021-09-24
  Administered 2021-09-23 – 2021-09-24 (×3): 1 g via INTRAVENOUS
  Filled 2021-09-23 (×3): qty 50

## 2021-09-23 MED ORDER — HYDROMORPHONE HCL 1 MG/ML IJ SOLN
INTRAMUSCULAR | Status: AC
  Filled 2021-09-23: qty 2

## 2021-09-23 MED ORDER — BACITRACIN-NEOMYCIN-POLYMYXIN 400-5-5000 EX OINT: 1.0000 "application " | TOPICAL_OINTMENT | Freq: Three times a day (TID) | CUTANEOUS | Status: DC | PRN

## 2021-09-23 MED ORDER — STERILE WATER FOR IRRIGATION IR SOLN
Status: DC | PRN
  Administered 2021-09-23: 1000 mL

## 2021-09-23 MED ORDER — BUPIVACAINE LIPOSOME 1.3 % IJ SUSP
INTRAMUSCULAR | Status: AC
  Filled 2021-09-23: qty 20

## 2021-09-23 MED ORDER — OXYBUTYNIN CHLORIDE 5 MG PO TABS
5.0000 mg | ORAL_TABLET | Freq: Three times a day (TID) | ORAL | Status: DC | PRN
  Administered 2021-09-24: 5 mg via ORAL
  Filled 2021-09-23: qty 1

## 2021-09-23 MED ORDER — CLINDAMYCIN PHOSPHATE 900 MG/50ML IV SOLN
INTRAVENOUS | Status: DC | PRN
  Administered 2021-09-23: 900 mg via INTRAVENOUS

## 2021-09-23 MED ORDER — LIDOCAINE HCL (PF) 2 % IJ SOLN
INTRAMUSCULAR | Status: AC
  Filled 2021-09-23: qty 5

## 2021-09-23 MED ORDER — DIPHENHYDRAMINE HCL 50 MG/ML IJ SOLN
12.5000 mg | Freq: Four times a day (QID) | INTRAMUSCULAR | Status: DC | PRN
Start: 2021-09-23 — End: 2021-09-24

## 2021-09-23 MED ORDER — HYDROMORPHONE HCL 1 MG/ML IJ SOLN
0.5000 mg | INTRAMUSCULAR | Status: DC | PRN
  Administered 2021-09-23 (×2): 1 mg via INTRAVENOUS
  Filled 2021-09-23 (×2): qty 1

## 2021-09-23 MED ORDER — CLINDAMYCIN PHOSPHATE 900 MG/50ML IV SOLN
INTRAVENOUS | Status: AC
  Filled 2021-09-23: qty 50

## 2021-09-23 MED ORDER — ACETAMINOPHEN 10 MG/ML IV SOLN
1000.0000 mg | Freq: Four times a day (QID) | INTRAVENOUS | Status: DC
  Administered 2021-09-23 – 2021-09-24 (×3): 1000 mg via INTRAVENOUS
  Filled 2021-09-23 (×4): qty 100

## 2021-09-23 MED ORDER — LIDOCAINE 2% (20 MG/ML) 5 ML SYRINGE
INTRAMUSCULAR | Status: DC | PRN
  Administered 2021-09-23: 60 mg via INTRAVENOUS

## 2021-09-23 MED ORDER — ROCURONIUM BROMIDE 10 MG/ML (PF) SYRINGE
PREFILLED_SYRINGE | INTRAVENOUS | Status: DC | PRN
  Administered 2021-09-23: 30 mg via INTRAVENOUS
  Administered 2021-09-23: 40 mg via INTRAVENOUS
  Administered 2021-09-23 (×2): 20 mg via INTRAVENOUS
  Administered 2021-09-23: 60 mg via INTRAVENOUS

## 2021-09-23 MED ORDER — DEXAMETHASONE SODIUM PHOSPHATE 10 MG/ML IJ SOLN
INTRAMUSCULAR | Status: DC | PRN
  Administered 2021-09-23: 4 mg via INTRAVENOUS

## 2021-09-23 MED ORDER — DIPHENHYDRAMINE HCL 12.5 MG/5ML PO ELIX: 12.5000 mg | ORAL_SOLUTION | Freq: Four times a day (QID) | ORAL | Status: DC | PRN

## 2021-09-23 MED ORDER — CHLORHEXIDINE GLUCONATE 0.12 % MT SOLN
15.0000 mL | Freq: Once | OROMUCOSAL | Status: AC
  Administered 2021-09-23: 15 mL via OROMUCOSAL

## 2021-09-23 MED ORDER — BISOPROLOL FUMARATE 5 MG PO TABS
2.5000 mg | ORAL_TABLET | Freq: Every day | ORAL | Status: DC
  Administered 2021-09-24: 2.5 mg via ORAL
  Filled 2021-09-23: qty 1

## 2021-09-23 MED ORDER — DEXTROSE-NACL 5-0.45 % IV SOLN: INTRAVENOUS | Status: DC

## 2021-09-23 MED ORDER — ALPRAZOLAM 0.25 MG PO TABS: 0.2500 mg | ORAL_TABLET | Freq: Two times a day (BID) | ORAL | Status: DC | PRN

## 2021-09-23 MED ORDER — BUPIVACAINE-EPINEPHRINE (PF) 0.5% -1:200000 IJ SOLN
INTRAMUSCULAR | Status: AC
  Filled 2021-09-23: qty 30

## 2021-09-23 MED ORDER — LACTATED RINGERS IV SOLN: INTRAVENOUS | Status: DC

## 2021-09-23 MED ORDER — DEXAMETHASONE SODIUM PHOSPHATE 10 MG/ML IJ SOLN
INTRAMUSCULAR | Status: AC
  Filled 2021-09-23: qty 1

## 2021-09-23 MED ORDER — PANTOPRAZOLE SODIUM 40 MG PO TBEC
40.0000 mg | DELAYED_RELEASE_TABLET | Freq: Every day | ORAL | Status: DC
Start: 2021-09-24 — End: 2021-09-24
  Administered 2021-09-24: 40 mg via ORAL
  Filled 2021-09-23: qty 1

## 2021-09-23 MED ORDER — HEMOSTATIC AGENTS (NO CHARGE) OPTIME
TOPICAL | Status: DC | PRN
  Administered 2021-09-23: 1 via TOPICAL

## 2021-09-23 MED ORDER — MIDAZOLAM HCL 2 MG/2ML IJ SOLN
INTRAMUSCULAR | Status: AC
  Filled 2021-09-23: qty 2

## 2021-09-23 MED ORDER — BUPIVACAINE-EPINEPHRINE (PF) 0.5% -1:200000 IJ SOLN
INTRAMUSCULAR | Status: DC | PRN
  Administered 2021-09-23: 30 mL

## 2021-09-23 MED ORDER — DOCUSATE SODIUM 100 MG PO CAPS: 300.0000 mg | ORAL_CAPSULE | Freq: Every day | ORAL | Status: DC

## 2021-09-23 MED ORDER — PHENYLEPHRINE 40 MCG/ML (10ML) SYRINGE FOR IV PUSH (FOR BLOOD PRESSURE SUPPORT)
PREFILLED_SYRINGE | INTRAVENOUS | Status: DC | PRN
  Administered 2021-09-23: 120 ug via INTRAVENOUS

## 2021-09-23 MED ORDER — FENTANYL CITRATE (PF) 100 MCG/2ML IJ SOLN
INTRAMUSCULAR | Status: DC | PRN
Start: 2021-09-23 — End: 2021-09-23
  Administered 2021-09-23: 25 ug via INTRAVENOUS
  Administered 2021-09-23 (×3): 50 ug via INTRAVENOUS
  Administered 2021-09-23: 100 ug via INTRAVENOUS
  Administered 2021-09-23: 25 ug via INTRAVENOUS

## 2021-09-23 MED ORDER — SODIUM CHLORIDE 0.9 % IV BOLUS
1000.0000 mL | Freq: Once | INTRAVENOUS | Status: DC
Start: 2021-09-23 — End: 2021-09-23

## 2021-09-23 MED ORDER — HYDROMORPHONE HCL 1 MG/ML IJ SOLN
0.2500 mg | INTRAMUSCULAR | Status: DC | PRN
  Administered 2021-09-23 (×2): 0.5 mg via INTRAVENOUS

## 2021-09-23 MED ORDER — ONDANSETRON HCL 4 MG/2ML IJ SOLN
INTRAMUSCULAR | Status: AC
  Filled 2021-09-23: qty 2

## 2021-09-23 MED ORDER — BUSPIRONE HCL 5 MG PO TABS: 5.0000 mg | ORAL_TABLET | Freq: Two times a day (BID) | ORAL | Status: DC

## 2021-09-23 MED ORDER — LACTATED RINGERS IR SOLN
Status: DC | PRN
  Administered 2021-09-23: 1000 mL

## 2021-09-23 MED ORDER — ORAL CARE MOUTH RINSE: 15.0000 mL | Freq: Once | OROMUCOSAL | Status: AC

## 2021-09-23 MED ORDER — PROPOFOL 10 MG/ML IV BOLUS
INTRAVENOUS | Status: DC | PRN
  Administered 2021-09-23: 200 mg via INTRAVENOUS

## 2021-09-23 MED ORDER — KETOROLAC TROMETHAMINE 30 MG/ML IJ SOLN
INTRAMUSCULAR | Status: AC
  Filled 2021-09-23: qty 1

## 2021-09-23 MED ORDER — KETOROLAC TROMETHAMINE 30 MG/ML IJ SOLN
30.0000 mg | Freq: Once | INTRAMUSCULAR | Status: AC | PRN
  Administered 2021-09-23: 30 mg via INTRAVENOUS

## 2021-09-23 MED ORDER — MEPERIDINE HCL 50 MG/ML IJ SOLN: 6.2500 mg | INTRAMUSCULAR | Status: DC | PRN

## 2021-09-23 MED ORDER — BUPIVACAINE LIPOSOME 1.3 % IJ SUSP
INTRAMUSCULAR | Status: DC | PRN
  Administered 2021-09-23: 20 mL

## 2021-09-23 MED ORDER — MIDAZOLAM HCL 2 MG/2ML IJ SOLN
INTRAMUSCULAR | Status: DC | PRN
  Administered 2021-09-23: 2 mg via INTRAVENOUS

## 2021-09-23 MED ORDER — TRAMADOL HCL 50 MG PO TABS
50.0000 mg | ORAL_TABLET | Freq: Four times a day (QID) | ORAL | Status: DC | PRN
  Administered 2021-09-23: 100 mg via ORAL
  Administered 2021-09-24: 50 mg via ORAL
  Filled 2021-09-23: qty 1
  Filled 2021-09-23: qty 2

## 2021-09-23 MED ORDER — PROMETHAZINE HCL 25 MG/ML IJ SOLN: 6.2500 mg | INTRAMUSCULAR | Status: DC | PRN

## 2021-09-23 MED ORDER — ONDANSETRON HCL 4 MG/2ML IJ SOLN
4.0000 mg | INTRAMUSCULAR | Status: DC | PRN
  Administered 2021-09-23 (×2): 4 mg via INTRAVENOUS
  Filled 2021-09-23 (×2): qty 2

## 2021-09-23 MED ORDER — SUGAMMADEX SODIUM 200 MG/2ML IV SOLN
INTRAVENOUS | Status: DC | PRN
  Administered 2021-09-23: 150 mg via INTRAVENOUS

## 2021-09-23 MED ORDER — SULFAMETHOXAZOLE-TRIMETHOPRIM 800-160 MG PO TABS: 1.0000 | ORAL_TABLET | Freq: Two times a day (BID) | ORAL | 0 refills | Status: DC

## 2021-09-23 SURGICAL SUPPLY — 59 items
APPLICATOR COTTON TIP 6 STRL (MISCELLANEOUS) ×2 IMPLANT
APPLICATOR COTTON TIP 6IN STRL (MISCELLANEOUS) ×3
APPLICATOR SURGIFLO ENDO (HEMOSTASIS) ×1 IMPLANT
BAG COUNTER SPONGE SURGICOUNT (BAG) IMPLANT
CATH FOLEY 2WAY SLVR 18FR 30CC (CATHETERS) ×3 IMPLANT
CATH TIEMANN FOLEY 18FR 5CC (CATHETERS) ×3 IMPLANT
CHLORAPREP W/TINT 26 (MISCELLANEOUS) ×3 IMPLANT
CLIP LIGATING HEM O LOK PURPLE (MISCELLANEOUS) ×6 IMPLANT
COVER SURGICAL LIGHT HANDLE (MISCELLANEOUS) ×3 IMPLANT
COVER TIP SHEARS 8 DVNC (MISCELLANEOUS) ×2 IMPLANT
COVER TIP SHEARS 8MM DA VINCI (MISCELLANEOUS) ×3
CUTTER ECHEON FLEX ENDO 45 340 (ENDOMECHANICALS) ×3 IMPLANT
DERMABOND ADVANCED (GAUZE/BANDAGES/DRESSINGS) ×1
DERMABOND ADVANCED .7 DNX12 (GAUZE/BANDAGES/DRESSINGS) ×2 IMPLANT
DRAIN CHANNEL RND F F (WOUND CARE) ×1 IMPLANT
DRAPE ARM DVNC X/XI (DISPOSABLE) ×8 IMPLANT
DRAPE COLUMN DVNC XI (DISPOSABLE) ×2 IMPLANT
DRAPE DA VINCI XI ARM (DISPOSABLE) ×12
DRAPE DA VINCI XI COLUMN (DISPOSABLE) ×3
DRAPE SURG IRRIG POUCH 19X23 (DRAPES) ×3 IMPLANT
DRSG TEGADERM 4X4.75 (GAUZE/BANDAGES/DRESSINGS) ×3 IMPLANT
ELECT PENCIL ROCKER SW 15FT (MISCELLANEOUS) ×3 IMPLANT
ELECT REM PT RETURN 15FT ADLT (MISCELLANEOUS) ×3 IMPLANT
GAUZE 4X4 16PLY ~~LOC~~+RFID DBL (SPONGE) IMPLANT
GAUZE SPONGE 2X2 8PLY STRL LF (GAUZE/BANDAGES/DRESSINGS) IMPLANT
GAUZE SPONGE 4X4 12PLY STRL (GAUZE/BANDAGES/DRESSINGS) ×3 IMPLANT
GLOVE SURG ENC MOIS LTX SZ6.5 (GLOVE) ×3 IMPLANT
GLOVE SURG ENC TEXT LTX SZ7.5 (GLOVE) ×6 IMPLANT
GOWN STRL REUS W/TWL LRG LVL3 (GOWN DISPOSABLE) ×3 IMPLANT
GOWN STRL REUS W/TWL XL LVL3 (GOWN DISPOSABLE) ×6 IMPLANT
HOLDER FOLEY CATH W/STRAP (MISCELLANEOUS) ×3 IMPLANT
IRRIG SUCT STRYKERFLOW 2 WTIP (MISCELLANEOUS) ×3
IRRIGATION SUCT STRKRFLW 2 WTP (MISCELLANEOUS) ×2 IMPLANT
IV LACTATED RINGERS 1000ML (IV SOLUTION) ×3 IMPLANT
KIT TURNOVER KIT A (KITS) IMPLANT
PACK ROBOT UROLOGY CUSTOM (CUSTOM PROCEDURE TRAY) ×3 IMPLANT
PAD POSITIONING PINK XL (MISCELLANEOUS) ×3 IMPLANT
RELOAD STAPLE 45 4.1 GRN THCK (STAPLE) ×2 IMPLANT
SEAL CANN UNIV 5-8 DVNC XI (MISCELLANEOUS) ×8 IMPLANT
SEAL XI 5MM-8MM UNIVERSAL (MISCELLANEOUS) ×12
SET TUBE SMOKE EVAC HIGH FLOW (TUBING) ×3 IMPLANT
SOLUTION ELECTROLUBE (MISCELLANEOUS) ×3 IMPLANT
SPIKE FLUID TRANSFER (MISCELLANEOUS) ×3 IMPLANT
SPONGE GAUZE 2X2 STER 10/PKG (GAUZE/BANDAGES/DRESSINGS)
STAPLE RELOAD 45 GRN (STAPLE) ×2 IMPLANT
STAPLE RELOAD 45MM GREEN (STAPLE) ×3
SURGIFLO W/THROMBIN 8M KIT (HEMOSTASIS) ×1 IMPLANT
SUT ETHILON 3 0 PS 1 (SUTURE) ×3 IMPLANT
SUT MNCRL AB 4-0 PS2 18 (SUTURE) ×6 IMPLANT
SUT V-LOC BARB 180 2/0GR6 GS22 (SUTURE) ×3
SUT VIC AB 0 CT1 27 (SUTURE) ×3
SUT VIC AB 0 CT1 27XBRD ANTBC (SUTURE) ×2 IMPLANT
SUT VICRYL 0 UR6 27IN ABS (SUTURE) ×7 IMPLANT
SUT VLOC BARB 180 ABS3/0GR12 (SUTURE) ×6
SUTURE V-LC BRB 180 2/0GR6GS22 (SUTURE) ×2 IMPLANT
SUTURE VLOC BRB 180 ABS3/0GR12 (SUTURE) ×4 IMPLANT
TOWEL OR NON WOVEN STRL DISP B (DISPOSABLE) ×3 IMPLANT
TROCAR XCEL NON-BLD 5MMX100MML (ENDOMECHANICALS) IMPLANT
WATER STERILE IRR 1000ML POUR (IV SOLUTION) ×3 IMPLANT

## 2021-09-23 NOTE — Anesthesia Postprocedure Evaluation (Signed)
Anesthesia Post Note  Patient: Tyler [name]  Procedure(s) Performed: XI ROBOTIC ASSISTED LAPAROSCOPIC RADICAL PROSTATECTOMY PELVIC LYMPH NODE DISSECTION (Bilateral)     Patient location during evaluation: PACU Anesthesia Type: General Level of consciousness: awake Pain management: pain level controlled Vital Signs Assessment: post-procedure vital signs reviewed and stable Respiratory status: spontaneous breathing Cardiovascular status: stable Postop Assessment: no apparent nausea or vomiting Anesthetic complications: no   No notable events documented.  Last Vitals:  Vitals:   09/23/21 1215 09/23/21 1230  BP: (!) 143/80 (!) 145/85  Pulse: 97 88  Resp: 18 15  Temp:  36.6 C  SpO2: 96% 97%    Last Pain:  Vitals:   09/23/21 1230  TempSrc:   PainSc: [place]

## 2021-09-23 NOTE — Anesthesia Procedure Notes (Signed)
Procedure Name: Intubation Date/Time: 09/23/2021 8:24 AM Performed by: Niel Hummer, CRNA Pre-anesthesia Checklist: Patient identified, Emergency Drugs available, Suction available and Patient being monitored Patient Re-evaluated:Patient Re-evaluated prior to induction Oxygen Delivery Method: Circle system utilized Preoxygenation: Pre-oxygenation with 100% oxygen Induction Type: IV induction Ventilation: Mask ventilation without difficulty Laryngoscope Size: Mac and 4 Grade View: Grade I Tube type: Oral Tube size: 7.5 mm Number of attempts: 1 Airway Equipment and Method: Stylet Placement Confirmation: ETT inserted through vocal cords under direct vision, positive ETCO2 and breath sounds checked- equal and bilateral Secured at: 23 cm Tube secured with: Tape Dental Injury: Teeth and Oropharynx as per pre-operative assessment

## 2021-09-23 NOTE — Discharge Instructions (Signed)

## 2021-09-23 NOTE — Transfer of Care (Signed)
Immediate Anesthesia Transfer of Care Note  Patient: Tyler [name]  Procedure(s) Performed: XI ROBOTIC ASSISTED LAPAROSCOPIC RADICAL PROSTATECTOMY PELVIC LYMPH NODE DISSECTION (Bilateral)  Patient Location: PACU  Anesthesia Type:General  Level of Consciousness: awake, alert  and oriented  Airway & Oxygen Therapy: Patient Spontanous Breathing and Patient connected to face mask oxygen  Post-op Assessment: Report given to RN, Post -op Vital signs reviewed and stable and Patient moving all extremities X 4  Post vital signs: Reviewed and stable  Last Vitals:  Vitals Value Taken Time  BP 142/77 09/23/21 1212  Temp    Pulse 114 09/23/21 1211  Resp 29 09/23/21 1215  SpO2 98 % 09/23/21 1211  Vitals shown include unvalidated device data.  Last Pain:  Vitals:   09/23/21 0721  TempSrc: Oral  PainSc:       Patients Stated Pain Goal: 4 (96/43/83 8184)  Complications: No notable events documented.

## 2021-09-23 NOTE — H&P (Signed)
09/15/2021: Pre-op visit today before undergoing robotic prostatectomy with BLPND on 63/08/6008 with Dr Louis Meckel. Denies any changes in past medical history, prescription medications taken on daily basis, no interval surgical intervention. He did have a tooth pulled 3 weeks ago but he has completely recovered from such. No changes in baseline lower urinary tract symptoms. He denies any interval dysuria or gross hematuria, no interval treatment for UTI. He denies any recent fevers or chills, nausea/vomiting, no interval chest pain or shortness of breath, lightheadedness or dizziness, numbness or tingling in the extremities. He endorses having normal daily bowel movements.     CC: Prostate Cancer   Physician requesting consult: Dr. Burman Nieves  PCP: Dr. 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 He has a history of prostatitis and a fluctuating PSA that was monitored but significantly increased in the absence of symptoms of prostatitis. His PSA was 5.38 in October of 2022 and he underwent an MRI of the prostate on 06/18/21 that demonstrated a PI-RADS 4 lesion of the left anterior apex. He underwent an MR/US fusion biopsy on 07/05/21 and this indicated Gleason 4+3=7 adenocarcinoma with 2 out of 3 targeted biopsies positive and 1 out of 12 systematic biopsies positive.   Family history: None.   Imaging studies:  MRI (06/18/21): No EPE, SVI, LAD, and bone lesions.   PMH: He has a history of hypertension, hyperlipidemia, and GERD. He has an allergy to PCN which has caused mouth sores.  PSH: No abdominal surgeries.   TNM stage: cT1c N0 Mx  PSA: 5.38  Gleason score: 4+3=7 (GG 3)  Biopsy (07/05/21): 3/15 cores positive  Left: Benign  Right: R lateral base (30%, 4+3=7)  Targeted: 2/3 cores (3+4=7, 5%, < 5%)  Prostate  volume: 41.4 cc   Nomogram  OC disease: 67%  EPE: 34%  SVI: 6%  LNI: 6%  PFS (5 year, 10 year): 67%, 67%   Urinary function: IPSS is 6.  Erectile function: SHIM score is 24.     ALLERGIES: Penicillins - Mouth sores    MEDICATIONS: Tamsulosin Hcl 0.4 mg capsule 1 capsule PO Daily  Bisoprolol Fumarate 5 MG Oral Tablet 0 Oral  Buspirone Hcl  Crestor 20 mg tablet 0 Oral  Fiber TABS Oral  Fish Oil CAPS Oral  Glycopyrrolate 2 mg tablet 0 Oral  Multi-Day tablet Oral  Stool Softener 100 mg tablet Oral  Tylenol PM Extra Strength TABS Oral     GU PSH: Prostate Needle Biopsy - 07/05/2021       PSH Notes: History Of Prior Surgery, PT for shoulder   NON-GU PSH: Surgical Pathology, Gross And Microscopic Examination For Prostate Needle - 07/05/2021     GU PMH: Post-void dribbling - 09/01/2021, - 05/24/2021 (Stable), - 05/19/2020, - 05/21/2019 Prostate Cancer - 09/01/2021, - 08/13/2021, - 07/21/2021 Elevated PSA - 07/05/2021, - 05/24/2021, - 05/19/2020, - 05/21/2019 Chronic prostatitis - 05/19/2020, Prostatitis, chronic, - 2017 BPH w/o LUTS, Benign enlargement of prostate - 2017 Urinary Frequency, Increased urinary frequency - 2017    NON-GU PMH: Other lack of coordination - 09/01/2021 Sebaceous cyst - 2018 Encounter for general adult medical examination without abnormal findings, Encounter for preventive health examination - 2017 Personal history of other specified conditions, History of heartburn - 2015    FAMILY HISTORY: cardiac disorder - Runs In Family No Significant Family History -  Runs In Family   SOCIAL HISTORY: Marital Status: Married Preferred Language: English; Ethnicity: Not Hispanic Or Latino; Race: White Current Smoking Status: Patient has never smoked.   Tobacco Use Assessment Completed: Used Tobacco in last 30 days? Does drink.  Does not drink caffeine. 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     Notes: Former smoker, Married, Occupation, Mother's age,  Caffeine use, Three children, Alcohol use, Father deceased   REVIEW OF SYSTEMS:    GU Review Male:   Patient denies frequent urination, hard to postpone urination, burning/ pain with urination, get up at night to urinate, leakage of urine, stream starts and stops, trouble starting your stream, have to strain to urinate , erection problems, and penile pain.  Gastrointestinal (Upper):   Patient denies nausea, vomiting, and indigestion/ heartburn.  Gastrointestinal (Lower):   Patient denies diarrhea and constipation.  Constitutional:   Patient denies fever, night sweats, weight loss, and fatigue.  Skin:   Patient denies skin rash/ lesion and itching.  Eyes:   Patient denies blurred vision and double vision.  Ears/ Nose/ Throat:   Patient denies sore throat and sinus problems.  Hematologic/Lymphatic:   Patient denies swollen glands and easy bruising.  Cardiovascular:   Patient denies leg swelling and chest pains.  Respiratory:   Patient denies cough and shortness of breath.  Endocrine:   Patient denies excessive thirst.  Musculoskeletal:   Patient denies back pain and joint pain.  Neurological:   Patient denies headaches and dizziness.  Psychologic:   Patient denies depression and anxiety.   VITAL SIGNS:      09/15/2021 08:10 AM  Weight 148 lb / 67.13 kg  Height 67 in / 170.18 cm  BP 133/91 mmHg  Pulse 81 /min  Temperature 97.8 F / 36.5 C  BMI 23.2 kg/m   MULTI-SYSTEM PHYSICAL EXAMINATION:    Constitutional: Well-nourished. No physical deformities. Normally developed. Good grooming.  Neck: Neck symmetrical, not swollen. Normal tracheal position.  Respiratory: No labored breathing, no use of accessory muscles.   Cardiovascular: Normal temperature, normal extremity pulses, no swelling, no varicosities.  Skin: No paleness, no jaundice, no cyanosis. No lesion, no ulcer, no rash.  Neurologic / Psychiatric: Oriented to time, oriented to place, oriented to person. No depression, no anxiety, no  agitation.  Gastrointestinal: No mass, no tenderness, no rigidity, non obese abdomen.  Musculoskeletal: Normal gait and station of head and neck.     Complexity of Data:  Source Of History:  Patient, Medical Record Summary  Lab Test Review:   PSA  Records Review:   Pathology Reports, Previous Doctor Records, Previous Hospital Records, Previous Patient Records  Urine Test Review:   Urinalysis   05/19/21 05/14/20  PSA  Total PSA 5.38 ng/mL 3.36 ng/mL    09/15/21  Urinalysis  Urine Appearance Clear   Urine Color Yellow   Urine Glucose Neg mg/dL  Urine Bilirubin Neg mg/dL  Urine Ketones Neg mg/dL  Urine Specific Gravity 1.025   Urine Blood Trace ery/uL  Urine pH 5.5   Urine Protein Neg mg/dL  Urine Urobilinogen 0.2 mg/dL  Urine Nitrites Neg   Urine Leukocyte Esterase Neg leu/uL  Urine WBC/hpf 0 - 5/hpf   Urine RBC/hpf 0 - 2/hpf   Urine Epithelial Cells 0 - 5/hpf   Urine Bacteria NS (Not Seen)   Urine Mucous Not Present   Urine Yeast NS (Not Seen)   Urine Trichomonas Not Present   Urine Cystals NS (Not Seen)   Urine Casts NS (Not Seen)  Urine Sperm Not Present    PROCEDURES:          Urinalysis w/Scope - 81001 Dipstick Dipstick Cont'd Micro  Color: Yellow Bilirubin: Neg WBC/hpf: 0 - 5/hpf  Appearance: Clear Ketones: Neg RBC/hpf: 0 - 2/hpf  Specific Gravity: 1.025 Blood: Trace Bacteria: NS (Not Seen)  pH: 5.5 Protein: Neg Cystals: NS (Not Seen)  Glucose: Neg Urobilinogen: 0.2 Casts: NS (Not Seen)    Nitrites: Neg Trichomonas: Not Present    Leukocyte Esterase: Neg Mucous: Not Present      Epithelial Cells: 0 - 5/hpf      Yeast: NS (Not Seen)      Sperm: Not Present    Notes:      ASSESSMENT:      ICD-10 Details  1 GU:   Prostate Cancer - C61 Chronic, Threat to Bodily Function  2 NON-GU:   Encounter for other preprocedural examination - Z01.818 Undiagnosed New Problem   PLAN:           Schedule Return Visit/Planned Activity: Keep Scheduled Appointment -  Follow up MD, Schedule Surgery          Document Letter(s):  Created for Patient: Clinical Summary         Notes:   All questions answered to the best of my ability regarding the upcoming procedure   Postoperative course with understanding expressed by the patient. Precautionary urine culture sent today as baseline. He will proceed with previously scheduled robotic prostatectomy on 2/23 with his urologist.

## 2021-09-23 NOTE — Op Note (Signed)
Preoperative diagnosis:  Prostate Cancer   Postoperative diagnosis:  same   Procedure: Robotic assisted laparoscopic radical prostatectomy Bilateral pelvic lymph node dissection  Surgeon: Ardis Hughs, MD First Assistant: Debbrah Alar, PA  Anesthesia: General  Complications: None  Intraoperative findings:  #1 - sticky adherent rectal plane, good nerve spare on the patient's left side, right nerve spare less robust from adherent tissue planes. #2 - nice tight bladder urethral anastamosis  EBL: 150cc  Specimens:  #1.  Prostate and seminal vesicals #2.  Bilateral pelvic lymph nodes #3.  Left posterior prostate base  Indication: Tyler [name] is a 66 y.o. patient with prostate cancer.  After reviewing the management options for treatment, he elected to proceed with the removal of his prostate. We have discussed the potential benefits and risks of the procedure, side effects of the proposed treatment, the likelihood of the patient achieving the goals of the procedure, and any potential problems that might occur during the procedure or recuperation. Informed consent has been obtained.  Description of procedure:  The patient was consented in the preoperative holding area. He was in brought back to the operating room placed the table in supine position. General anesthesia was then induced and endotracheal tube was inserted. He was then placed in dorsolithotomy position and placed in steep Trendelenburg. He was then prepped and draped in the routine sterile fashion. We, the first assistant and I, then began by making a 10 mm incision supraumbilical midline incision the skin down through into the peritoneum. Then placed a 8 mm trocar. I then inflated the abdomen and inserted the 0 robotic lens. We then placed 2 additional a 8 millimeter trochars in the patient's left lower abdomen proximally 9 cm apart and 2 trochars on the patient's right lower abdomen, one was an 8 mm trocar and  the one most lateral was a 12 mm trocar which was used as the assistant port. A 5 mm trocar was placed by triangulating the 2 right lateral ports as a second assistant port. These ports were all placed under visual guidance. Once the ports were noted to be satisfactory position the robot was docked. We started with the 0 lens, monopolar scissors in the right hand and the [place] forceps the left hand as well as a fenestrated grasper as the third arm on the left-hand side.   We, the first assistant and I,  began our dissection the posterior plane incising the peritoneum at the level of the vas deferens. Isolated the left vas deferens and dissected it proximally towards the spermatic cord for 5 cm prior to ligating it. Then used this as traction to isolate the left the seminal vesicle which was then undressed bluntly and completely dissected out, all vessels were cauterized with a combination of bipolar and the monopolar scissors. We then turned our attention to the right side and similarly dissected out the right vas deferens and seminal vesicle. Once the SVs had been freed, we turned our attention to the posterior plane and bluntly dissected the tissue between the rectum and the posterior wall of the prostate bluntly out towards the apex.    At this point the bladder was taken down starting at the urachal remnant with a combination of both blunt dissection and sharp dissection using monopolar cautery the bladder was dropped down in the usual fashion to the medial umbilical ligaments laterally and the dorsal vein of the prostate anteriorly creating our space of Retzius. We then turned our attention to the endopelvic  fascia which was incised laterally starting on the patient's right-hand side the levator muscles were pushed off the prostate laterally up towards the dorsal vein complex on the right-hand side. This process was then repeated on the left-hand side and a nice notch was created for the dorsal vein. I  then used a 83mm stapler to staple the dorsal vein.   We,the first assistant and I, then located the bladder neck at the vesicoprostatic junction and using the monopolar scissors dissected down through the perivesical tissues and the bladder neck down to the prostatic urethra. The catheter was then deflated and pulled through our urethral opening and then used to retract the prostate anteriorly for the posterior bladder neck dissection. Once through the bladder neck and into the posterior plane of the prostate, the SVs were brought through the opening. The left pedicle was then isolated and systematically ligated with Weck clips and scissors. The nerve bundle was then peeled off the posterior lateral aspect of the prostate and bluntly dissected away off the prostate.  This was then repeated on the right side.    I then came down through the dorsal venous complex anteriorly down to the membranous urethra using the monopolar. Once down to the urethra, it was transected sharply and the apex of the prostate was then dissected off the levator and rectourethralis muscles. Once the apex of the prostate had been dissected free we came back to the base of the prostate and bluntly push the rectum and nerve vascular bundle off the prostate the patient's left and used clips on the patient's right to free the prostate. Once the prostate was free it was placed off to the side. The pelvis was then irrigated with normal saline and noted to be relatively hemostatic.  Attention was then turned to the right pelvic sidewall. The fibrofatty tissue between the external iliac vein, confluence of the iliac vessels, hypogastric artery, and Cooper's ligament was dissected free from the pelvic sidewall with care to preserve the obturator nerve. Weck clips were used for lymphostasis and hemostasis. An identical procedure was performed on the contralateral side and the lymphatic packets were removed for permanent pathologic  analysis.  The prostate and both lymph node tissues were placed in the Endo Catch bag and the string brought to the 5 mm port.    The vesicourethral anastomosis was then completed with 2 interlocking 3-0 V. lock sutures running the anastomosis in the 6:00 position to the 12:00 position on each side and then tying it off on the top. The final catheter was then passed through the patient's urethra and into the bladder and 120 cc was instilled into the bladder to test the anastomosis. As there was no leak a 88 [place] Blake drain was passed through the left lateral port and placed around the vesicourethral anastomosis. A 12 mm assistant port on the right lateral side was then closed with 0 Vicryl with the help of the [employer] needle. The 12 mm midline infraumbilical incision was then extended another centimeter taken down and the fascia opened to remove the Endo Catch bag with the prostate specimen. The fascia was then closed with a 0 Vicryl and all skin ports were closed with 4-0 Monocryl in a subcutaneous fashion. Dermabond glue was then applied to the incisions. The drain was then secured to the skin with a 0 nylon stitch and dressing applied.   At the end of the case all laps needles and sponges had been accounted for. There no immediate  complications. The patient returned to the PACU in stable condition.

## 2021-09-23 NOTE — Interval H&P Note (Signed)
History and Physical Interval Note:  09/23/2021 7:14 AM  Tyler [name]  has presented today for surgery, with the diagnosis of PROSTATE CANCER.  The various methods of treatment have been discussed with the patient and family. After consideration of risks, benefits and other options for treatment, the patient has consented to  Procedure(s) with comments: XI [place] (N/A) - 4.5 HRS PELVIC LYMPH NODE DISSECTION (Bilateral) as a surgical intervention.  The patient's history has been reviewed, patient examined, no change in status, stable for surgery.  I have reviewed the patient's chart and labs.  Questions were answered to the patient's satisfaction.     Ardis Hughs

## 2021-09-24 ENCOUNTER — Encounter (HOSPITAL_COMMUNITY): Payer: Self-pay | Admitting: Urology

## 2021-09-24 DIAGNOSIS — C61 Malignant neoplasm of prostate: Secondary | ICD-10-CM | POA: Diagnosis not present

## 2021-09-24 LAB — BASIC METABOLIC PANEL
Anion gap: 6 (ref 5–15)
BUN: 10 mg/dL (ref 8–23)
CO2: 27 mmol/L (ref 22–32)
Calcium: 8.4 mg/dL — ABNORMAL LOW (ref 8.9–10.3)
Chloride: 103 mmol/L (ref 98–111)
Creatinine, Ser: 1.1 mg/dL (ref 0.61–1.24)
GFR, Estimated: >60 mL/min (ref >60)
Glucose, Bld: 123 mg/dL — ABNORMAL HIGH (ref 70–99)
Potassium: 4.1 mmol/L (ref 3.5–5.1)
Sodium: 136 mmol/L (ref 135–145)

## 2021-09-24 LAB — HEMOGLOBIN AND HEMATOCRIT, BLOOD
HCT: 46.6 % (ref 39.0–52.0)
Hemoglobin: 15.2 g/dL (ref 13.0–17.0)

## 2021-09-24 MED ORDER — ONDANSETRON HCL 4 MG PO TABS: 4.0000 mg | ORAL_TABLET | Freq: Three times a day (TID) | ORAL | 0 refills | Status: DC | PRN

## 2021-09-24 NOTE — TOC Transition Note (Signed)
Transition of Care [facility]) - CM/SW Discharge Note   Patient Details  Name: KHAMARION BJELLAND MRN: [id number] Date of Birth: [birth year]  [facility] CM/SW Contact:  Leeroy Cha, RN Phone Number: 09/24/2021, 8:44 AM   Clinical Narrative:    Patient dcd to return to home with self care   Final next level of care: Home/Self Care Barriers to Discharge: No Barriers Identified   Patient Goals and CMS Choice Patient states their goals for this hospitalization and ongoing recovery are:: to go home CMS Medicare.gov Compare Post Acute Care list provided to:: Patient Choice offered to / list presented to : Patient  Discharge Placement                       Discharge Plan and Services   Discharge Planning Services: CM Consult                                 Social Determinants of Health (SDOH) Interventions     Readmission Risk Interventions No flowsheet data found.

## 2021-09-24 NOTE — Progress Notes (Signed)
Pt D/C to home, instructions reviewed with pt, acknowledged understanding of instructions. Demonstrate proper technique of changing leg bag to foley and emptying foley bag. Reviewed wound care and send home supplies to to use. SRP RN

## 2021-09-24 NOTE — Discharge Summary (Signed)
Date of admission: 09/23/2021  Date of discharge: 09/24/2021  Admission diagnosis: Prostate cancer  Discharge diagnosis: same  Secondary diagnoses:  Patient Active Problem List   Diagnosis Date Noted   Genetic testing 08/23/2021   Prostate cancer ([place]) 08/13/2021   Family history of breast cancer 08/13/2021   Family history of prostate cancer 08/13/2021   Palpitations 11/22/2017   Memory difficulties 11/22/2017   Essential hypertension 11/21/2014   Allergic rhinitis 11/21/2014   BPH (benign prostatic hyperplasia) 11/21/2014   Former smoker 11/21/2014   Plantar fasciitis of left foot 10/22/2014   Metatarsalgia of both feet 09/26/2014   Hematuria 10/05/2013   External hemorrhoids 04/17/2008   Irritable bowel syndrome 04/17/2008   History of colonic polyps 04/16/2008   Anxiety state 12/27/2007   Asthma 12/27/2007   GERD 12/27/2007   Hyperlipidemia 10/26/2007   Rosacea 10/26/2007    Procedures performed: Procedure(s): XI ROBOTIC ASSISTED LAPAROSCOPIC RADICAL PROSTATECTOMY PELVIC LYMPH NODE DISSECTION  History and Physical: For full details, please see admission history and physical. Briefly, Tyler [name] is a 66 y.o. year old patient with history of prostate cancer.   Hospital Course: Patient tolerated the procedure well.  He was then transferred to the floor after an uneventful PACU stay.  His hospital course was uncomplicated.  On POD#1 he had met discharge criteria: was eating a regular diet, was up and ambulating independently,  pain was well controlled, and was ready to for discharge.  PE on day of discharge: NAD Vitals:   09/23/21 1300 09/23/21 1324 09/23/21 2149 09/24/21 0448  BP: (!) 152/85 (!) 142/73 129/81 (!) 147/87  Pulse: 82 77 76 75  Resp: _0 Temp:  98.3 F (36.8 C) 98 F (36.7 C) 97.8 F (36.6 C)  TempSrc:  Oral  Oral  SpO2: 96% 100% 97% 99%  Weight:      Height:        Intake/Output Summary (Last 24 hours) at 09/24/2021 0739 Last  data filed at 09/24/2021 0448 Gross per 24 hour  Intake 1721.15 ml  Output 2420 ml  Net -698.85 ml   Non-labored breathing Abdomen is soft, slight drainage from midline incision Foley draining blood tinged urine Extremities symmetric  Laboratory values:  Recent Labs    09/23/21 1237 09/24/21 0339  HGB 16.1 15.2  HCT 49.4 46.6   Recent Labs    09/24/21 0339  NA 136  K 4.1  CL 103  CO2 27  GLUCOSE 123*  BUN 10  CREATININE 1.10  CALCIUM 8.4*   No results for input(s): LABPT, INR in the last 72 hours. No results for input(s): LABURIN in the last 72 hours. Results for orders placed or performed during the hospital encounter of 09/20/21  SARS CORONAVIRUS 2 (TAT 6-24 HRS) Nasopharyngeal Nasopharyngeal Swab     Status: None   Collection Time: 09/20/21  6:51 AM   Specimen: Nasopharyngeal Swab  Result Value Ref Range Status   SARS Coronavirus 2 NEGATIVE NEGATIVE Final    Comment: (NOTE) SARS-CoV-2 target nucleic acids are NOT DETECTED.  The SARS-CoV-2 RNA is generally detectable in upper and lower respiratory specimens during the acute phase of infection. Negative results do not preclude SARS-CoV-2 infection, do not rule out co-infections with other pathogens, and should not be used as the sole basis for treatment or other patient management decisions. Negative results must be combined with clinical observations, patient history, and epidemiological information. The expected result is Negative.  Fact Sheet for Patients: SugarRoll.be  Fact  Sheet for Healthcare Providers: °https://www.fda.gov/media/138095/download ° °This test is not yet approved or cleared by the [place] FDA and  °has been authorized for detection and/or diagnosis of SARS-CoV-2 by °FDA under an Emergency Use Authorization (EUA). This EUA will remain  °in effect (meaning this test can be used) for the duration of the °COVID-19 declaration under Se ction 564(b)(1) of the Act,  21 U.S.C. °section 360bbb-3(b)(1), unless the authorization is terminated or °revoked sooner. ° °Performed at [facility], [address] [place], [place] °[address] °  ° ° °Disposition: Home ° °Discharge instruction: The patient was instructed to be ambulatory but told to refrain from heavy lifting, strenuous activity, or driving.  ° °Discharge medications:  °Allergies as of 09/24/2021   ° °   Reactions  ° Penicillins   ° Mouth sores  ° °  ° °  °Medication List  °  ° °STOP taking these medications   ° °diphenhydramine-acetaminophen 25-500 MG Tabs tablet °Commonly known as: TYLENOL PM °  °HYDROcodone bit-homatropine 5-1.5 MG/5ML syrup °Commonly known as: HYCODAN °  °meloxicam 7.5 MG tablet °Commonly known as: MOBIC °  °multivitamin per tablet °  °PROBIOTIC DAILY PO °  °tamsulosin 0.4 MG Caps capsule °Commonly known as: FLOMAX °  ° °  ° °TAKE these medications   ° °ALPRAZolam 0.25 MG tablet °Commonly known as: XANAX °Take 1 tablet (0.25 mg total) by mouth 2 (two) times daily as needed for anxiety (do not drive for 6-8 hours after taking). °  °bisoprolol 5 MG tablet °Commonly known as: ZEBETA °TAKE 1/2 TABLET(2.5 MG) BY MOUTH DAILY °  °busPIRone 5 MG tablet °Commonly known as: BUSPAR °Take 1 tablet (5 mg total) by mouth 2 (two) times daily. °  °docusate sodium 100 MG capsule °Commonly known as: COLACE °Take 300 mg by mouth at bedtime. °  °FIBER PO °Take 3 tablets by mouth daily. °  °fluticasone 50 MCG/ACT nasal spray °Commonly known as: FLONASE °Place 2 sprays into both nostrils daily. °What changed:  °when to take this °reasons to take this °  °glycopyrrolate 2 MG tablet °Commonly known as: ROBINUL °TAKE 1 TABLET(2 MG) BY MOUTH TWICE DAILY AS NEEDED °  °Hydrocortisone Ace-Pramoxine 2.5-1 % Crea °Apply 1 application topically 2 (two) times daily as needed. °  °omeprazole 20 MG capsule °Commonly known as: PRILOSEC °Take 1 capsule (20 mg total) by mouth daily. °  °ondansetron 4 MG tablet °Commonly known as:  Zofran °Take 1 tablet (4 mg total) by mouth every 8 (eight) hours as needed for nausea or vomiting. °  °rosuvastatin 20 MG tablet °Commonly known as: CRESTOR °TAKE 1 TABLET(20 MG) BY MOUTH 1 TIME A WEEK °  °sulfamethoxazole-trimethoprim 800-160 MG tablet °Commonly known as: BACTRIM DS °Take 1 tablet by mouth 2 (two) times daily. Start the day prior to foley removal appointment °  °traMADol 50 MG tablet °Commonly known as: Ultram °Take 1-2 tablets (50-100 mg total) by mouth every 6 (six) hours as needed for moderate pain or severe pain. °  ° °  ° ° °Followup:  ° Follow-up Information   ° ° Gibson, Larry Ryan, NP Follow up on 09/30/2021.   °Specialty: Nurse Practitioner °Why: at 8:15 °Contact information: °[address] °[address] ° [place] [address] °[phone] ° ° °  °  ° °  °  ° °  ° °  °

## 2021-09-29 LAB — SURGICAL PATHOLOGY

## 2021-09-30 ENCOUNTER — Ambulatory Visit: Payer: Medicare Other | Admitting: Family Medicine

## 2021-10-04 ENCOUNTER — Other Ambulatory Visit: Payer: Self-pay

## 2021-10-11 NOTE — Progress Notes (Signed)
This encounter was created in error - please disregard.

## 2021-10-31 ENCOUNTER — Other Ambulatory Visit: Payer: Self-pay | Admitting: Family Medicine

## 2021-11-07 ENCOUNTER — Other Ambulatory Visit: Payer: Self-pay | Admitting: Family Medicine

## 2021-11-12 NOTE — Progress Notes (Signed)
Patient is s/p robotic prostatectomy with BLPND on 88/89/1694 with Dr Louis Meckel.  ? ?Pt has been seen post operatively @ [facility] and next appointment is 4/25 with Dr. Louis Meckel.  No barriers to identify at this time. ?

## 2021-11-23 ENCOUNTER — Other Ambulatory Visit: Payer: Self-pay | Admitting: Family Medicine

## 2021-12-23 ENCOUNTER — Ambulatory Visit (INDEPENDENT_AMBULATORY_CARE_PROVIDER_SITE_OTHER): Payer: Medicare Other

## 2021-12-23 DIAGNOSIS — Z0000 Encounter for general adult medical examination without abnormal findings: Secondary | ICD-10-CM | POA: Diagnosis not present

## 2021-12-23 NOTE — Patient Instructions (Signed)
[name] , Thank you for taking time to come for your Medicare Wellness Visit. I appreciate your ongoing commitment to your health goals. Please review the following plan we discussed and let me know if I can assist you in the future.   Screening recommendations/referrals: Colonoscopy: done 05/27/16 repeat every 10 years  Recommended yearly ophthalmology/optometry visit for glaucoma screening and checkup Recommended yearly dental visit for hygiene and checkup  Vaccinations: Influenza vaccine: Done 08/04/20 repeat every year  Pneumococcal vaccine: due  Tdap vaccine: Done 12/18/17 repeat every 10 years  Shingles vaccine: Shingrix discussed. Please contact your pharmacy for coverage information.    Covid-19: Completed 1/21, 2/10, 07/31/20  Advanced directives: Please bring a copy of your health care power of attorney and living will to the office at your convenience.  Conditions/risks identified: None at this time   Next appointment: Follow up in one year for your annual wellness visit.   Preventive Care 67 Years and Older, Male Preventive care refers to lifestyle choices and visits with your health care provider that can promote health and wellness. What does preventive care include? A yearly physical exam. This is also called an annual well check. Dental exams once or twice a year. Routine eye exams. Ask your health care provider how often you should have your eyes checked. Personal lifestyle choices, including: Daily care of your teeth and gums. Regular physical activity. Eating a healthy diet. Avoiding tobacco and drug use. Limiting alcohol use. Practicing safe sex. Taking low doses of aspirin every day. Taking vitamin and mineral supplements as recommended by your health care provider. What happens during an annual well check? The services and screenings done by your health care provider during your annual well check will depend on your age, overall health, lifestyle risk  factors, and family history of disease. Counseling  Your health care provider may ask you questions about your: Alcohol use. Tobacco use. Drug use. Emotional well-being. Home and relationship well-being. Sexual activity. Eating habits. History of falls. Memory and ability to understand (cognition). Work and work Statistician. Screening  You may have the following tests or measurements: Height, weight, and BMI. Blood pressure. Lipid and cholesterol levels. These may be checked every 5 years, or more frequently if you are over 50 years old. Skin check. Lung cancer screening. You may have this screening every year starting at age 67 if you have a 30-pack-year history of smoking and currently smoke or have quit within the past 15 years. Fecal occult blood test (FOBT) of the stool. You may have this test every year starting at age 67. Flexible sigmoidoscopy or colonoscopy. You may have a sigmoidoscopy every 5 years or a colonoscopy every 10 years starting at age 67. Prostate cancer screening. Recommendations will vary depending on your family history and other risks. Hepatitis C blood test. Hepatitis B blood test. Sexually transmitted disease (STD) testing. Diabetes screening. This is done by checking your blood sugar (glucose) after you have not eaten for a while (fasting). You may have this done every 1-3 years. Abdominal aortic aneurysm (AAA) screening. You may need this if you are a current or former smoker. Osteoporosis. You may be screened starting at age 48 if you are at high risk. Talk with your health care provider about your test results, treatment options, and if necessary, the need for more tests. Vaccines  Your health care provider may recommend certain vaccines, such as: Influenza vaccine. This is recommended every year. Tetanus, diphtheria, and acellular pertussis (Tdap, Td) vaccine.  You may need a Td booster every 10 years. Zoster vaccine. You may need this after age  9. Pneumococcal 13-valent conjugate (PCV13) vaccine. One dose is recommended after age 39. Pneumococcal polysaccharide (PPSV23) vaccine. One dose is recommended after age 40. Talk to your health care provider about which screenings and vaccines you need and how often you need them. This information is not intended to replace advice given to you by your health care provider. Make sure you discuss any questions you have with your health care provider. Document Released: 08/14/2015 Document Revised: 04/06/2016 Document Reviewed: 05/19/2015 Elsevier Interactive Patient Education  2017 [place] Prevention in the Home Falls can cause injuries. They can happen to people of all ages. There are many things you can do to make your home safe and to help prevent falls. What can I do on the outside of my home? Regularly fix the edges of walkways and driveways and fix any cracks. Remove anything that might make you trip as you walk through a door, such as a raised step or threshold. Trim any bushes or trees on the path to your home. Use bright outdoor lighting. Clear any walking paths of anything that might make someone trip, such as rocks or tools. Regularly check to see if handrails are loose or broken. Make sure that both sides of any steps have handrails. Any raised decks and porches should have guardrails on the edges. Have any leaves, snow, or ice cleared regularly. Use sand or salt on walking paths during winter. Clean up any spills in your garage right away. This includes oil or grease spills. What can I do in the bathroom? Use night lights. Install grab bars by the toilet and in the tub and shower. Do not use towel bars as grab bars. Use non-skid mats or decals in the tub or shower. If you need to sit down in the shower, use a plastic, non-slip stool. Keep the floor dry. Clean up any water that spills on the floor as soon as it happens. Remove soap buildup in the tub or shower  regularly. Attach bath mats securely with double-sided non-slip rug tape. Do not have throw rugs and other things on the floor that can make you trip. What can I do in the bedroom? Use night lights. Make sure that you have a light by your bed that is easy to reach. Do not use any sheets or blankets that are too big for your bed. They should not hang down onto the floor. Have a firm chair that has side arms. You can use this for support while you get dressed. Do not have throw rugs and other things on the floor that can make you trip. What can I do in the kitchen? Clean up any spills right away. Avoid walking on wet floors. Keep items that you use a lot in easy-to-reach places. If you need to reach something above you, use a strong step stool that has a grab bar. Keep electrical cords out of the way. Do not use floor polish or wax that makes floors slippery. If you must use wax, use non-skid floor wax. Do not have throw rugs and other things on the floor that can make you trip. What can I do with my stairs? Do not leave any items on the stairs. Make sure that there are handrails on both sides of the stairs and use them. Fix handrails that are broken or loose. Make sure that handrails are as long as  the stairways. Check any carpeting to make sure that it is firmly attached to the stairs. Fix any carpet that is loose or worn. Avoid having throw rugs at the top or bottom of the stairs. If you do have throw rugs, attach them to the floor with carpet tape. Make sure that you have a light switch at the top of the stairs and the bottom of the stairs. If you do not have them, ask someone to add them for you. What else can I do to help prevent falls? Wear shoes that: Do not have high heels. Have rubber bottoms. Are comfortable and fit you well. Are closed at the toe. Do not wear sandals. If you use a stepladder: Make sure that it is fully opened. Do not climb a closed stepladder. Make sure that  both sides of the stepladder are locked into place. Ask someone to hold it for you, if possible. Clearly mark and make sure that you can see: Any grab bars or handrails. First and last steps. Where the edge of each step is. Use tools that help you move around (mobility aids) if they are needed. These include: Canes. Walkers. Scooters. Crutches. Turn on the lights when you go into a dark area. Replace any light bulbs as soon as they burn out. Set up your furniture so you have a clear path. Avoid moving your furniture around. If any of your floors are uneven, fix them. If there are any pets around you, be aware of where they are. Review your medicines with your doctor. Some medicines can make you feel dizzy. This can increase your chance of falling. Ask your doctor what other things that you can do to help prevent falls. This information is not intended to replace advice given to you by your health care provider. Make sure you discuss any questions you have with your health care provider. Document Released: 05/14/2009 Document Revised: 12/24/2015 Document Reviewed: 08/22/2014 Elsevier Interactive Patient Education  2017 [employer].

## 2021-12-23 NOTE — Progress Notes (Signed)
Virtual Visit via Telephone Note  I connected with  Tyler [name] on 12/23/21 at 12:00 PM EDT by telephone and verified that I am speaking with the correct person using two identifiers.  Medicare Annual Wellness visit completed telephonically due to Covid-19 pandemic.   Persons participating in this call: This Health Coach and this patient.   Location: Patient: Home Provider: Office   I discussed the limitations, risks, security and privacy concerns of performing an evaluation and management service by telephone and the availability of in person appointments. The patient expressed understanding and agreed to proceed.  Unable to perform video visit due to video visit attempted and failed and/or patient does not have video capability.   Some vital signs may be absent or patient reported.   Willette Brace, LPN   Subjective:   Tyler [name] is a 66 y.o. male who presents for an Initial Medicare Annual Wellness Visit.  Review of Systems     Cardiac Risk Factors include: advanced age (>43mn, >>7women);male gender;dyslipidemia;hypertension     Objective:    There were no vitals filed for this visit. There is no height or weight on file to calculate BMI.     12/23/2021   11:58 AM 09/23/2021    4:00 PM 09/07/2021    9:12 AM 09/12/2020    3:44 PM 05/12/2016    8:03 AM 10/21/2014    3:22 PM 09/19/2014   12:11 PM  Advanced Directives  Does Patient Have a Medical Advance Directive? Yes Yes No No No No Yes  Type of Advance Directive Healthcare Power of [place] Does patient want to make changes to medical advance directive?  No - Patient declined       Copy of [place] Chart? No - copy requested        Would patient like information on creating a medical advance directive?      No - patient declined information     Current Medications (verified) Outpatient Encounter Medications as of 12/23/2021  Medication  Sig   ALPRAZolam (XANAX) 0.25 MG tablet Take 1 tablet (0.25 mg total) by mouth 2 (two) times daily as needed for anxiety (do not drive for 6-8 hours after taking).   bisoprolol (ZEBETA) 5 MG tablet TAKE 1/2 TABLET(2.5 MG) BY MOUTH DAILY   busPIRone (BUSPAR) 5 MG tablet TAKE 1 TABLET(5 MG) BY MOUTH TWICE DAILY   docusate sodium (COLACE) 100 MG capsule Take 300 mg by mouth at bedtime.   FIBER PO Take 3 tablets by mouth daily.   fluticasone (FLONASE) 50 MCG/ACT nasal spray Place 2 sprays into both nostrils daily. (Patient taking differently: Place 2 sprays into both nostrils daily as needed for allergies.)   omeprazole (PRILOSEC) 20 MG capsule TAKE 1 CAPSULE(20 MG) BY MOUTH DAILY   Pramoxine-HC (HYDROCORTISONE ACE-PRAMOXINE) 2.5-1 % CREA Apply 1 application topically 2 (two) times daily as needed.   rosuvastatin (CRESTOR) 20 MG tablet TAKE 1 TABLET(20 MG) BY MOUTH 1 TIME A WEEK   sildenafil (REVATIO) 20 MG tablet Take 20 mg by mouth at bedtime.   [DISCONTINUED] glycopyrrolate (ROBINUL) 2 MG tablet TAKE 1 TABLET(2 MG) BY MOUTH TWICE DAILY AS NEEDED   [DISCONTINUED] ondansetron (ZOFRAN) 4 MG tablet Take 1 tablet (4 mg total) by mouth every 8 (eight) hours as needed for nausea or vomiting.   [DISCONTINUED] sulfamethoxazole-trimethoprim (BACTRIM DS) 800-160 MG tablet Take 1 tablet by mouth 2 (two)  times daily. Start the day prior to foley removal appointment   [DISCONTINUED] traMADol (ULTRAM) 50 MG tablet Take 1-2 tablets (50-100 mg total) by mouth every 6 (six) hours as needed for moderate pain or severe pain.   No facility-administered encounter medications on file as of 12/23/2021.    Allergies (verified) Penicillins   History: Past Medical History:  Diagnosis Date   Allergy    Anxiety    Asthma    Cataract    beginning   Colon polyps    Depression    Family history of adverse reaction to anesthesia    GERD (gastroesophageal reflux disease)    Hematuria 10/05/2013   Hemorrhoids     Hyperlipidemia    Hypertension    IBS (irritable bowel syndrome)    alt diarrhea/ constipation   Rosacea    Past Surgical History:  Procedure Laterality Date   cataract surgery      COLONOSCOPY  07/28/2006   LYMPH NODE DISSECTION Bilateral 09/23/2021   Procedure: PELVIC LYMPH NODE DISSECTION;  Surgeon: Ardis Hughs, MD;  Location: [facility];  Service: Urology;  Laterality: Bilateral;   ROBOT ASSISTED LAPAROSCOPIC RADICAL PROSTATECTOMY N/A 09/23/2021   Procedure: XI ROBOTIC ASSISTED LAPAROSCOPIC RADICAL PROSTATECTOMY;  Surgeon: Ardis Hughs, MD;  Location: [facility];  Service: Urology;  Laterality: N/A;  4.5 HRS   Family History  Problem Relation Age of Onset   Glaucoma Mother        and father   Coronary artery disease Mother        late 33s-cabg. died at 65   Breast cancer Mother        dx. 15s   Coronary artery disease Father 66       death 90, presumed due to MI as sudden   Stomach cancer Maternal Uncle    Breast cancer Paternal Aunt    Prostate cancer Paternal Grandfather        dx. 80s   Colon cancer Neg Hx    Colon polyps Neg Hx    Esophageal cancer Neg Hx    Rectal cancer Neg Hx    Social History   Socioeconomic History   Marital status: Married    Spouse name: Not on file   Number of children: Not on file   Years of education: Not on file   Highest education level: Not on file  Occupational History   Not on file  Tobacco Use   Smoking status: Former    Packs/day: 0.75    Years: 4.00    Pack years: 3.00    Types: Cigarettes   Smokeless tobacco: Never  Vaping Use   Vaping Use: Never used  Substance and Sexual Activity   Alcohol use: Yes    Alcohol/week: 0.0 - 3.0 standard drinks    Comment: social   Drug use: No   Sexual activity: Not on file  Other Topics Concern   Not on file  Social History Narrative   Married (wife plans to transition to me 2020, see mother in law as well Nickola Major), 3 kids. 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 No  grandkids. 1 of his children is adopted.       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       Hobbies: biking, cycling, reading, movies   Social Determinants of Health   Financial Resource Strain: Low Risk    Difficulty of  Paying Living Expenses: Not hard at all  Food Insecurity: No Food Insecurity   Worried About [place] in the Last Year: Never true   Ran Out of Food in the Last Year: Never true  Transportation Needs: No Transportation Needs   Lack of Transportation (Medical): No   Lack of Transportation (Non-Medical): No  Physical Activity: Insufficiently Active   Days of Exercise per Week: 2 days   Minutes of Exercise per Session: 30 min  Stress: No Stress Concern Present   Feeling of Stress : Not at all  Social Connections: Socially Integrated   Frequency of Communication with Friends and Family: More than three times a week   Frequency of Social Gatherings with Friends and Family: More than three times a week   Attends Religious Services: 1 to 4 times per year   Active Member of [employer] or Organizations: Yes   Attends Archivist Meetings: 1 to 4 times per year   Marital Status: Married    Tobacco Counseling Counseling given: Not Answered   Clinical Intake:  Pre-visit preparation completed: Yes  Pain : No/denies pain     Diabetes: No  How often do you need to have someone help you when you read instructions, pamphlets, or other written materials from your doctor or pharmacy?: 1 - Never  Diabetic?no  Interpreter Needed?: No  Information entered by :: Charlott Rakes, LPN   Activities of Daily Living    12/23/2021   12:00 PM 09/23/2021    4:00 PM  In your present state of health, do you have any difficulty performing the following activities:  Hearing? 0 0  Vision? 0 0  Difficulty concentrating or making decisions? 0 0  Walking or  climbing stairs? 0 0  Dressing or bathing? 0 0  Doing errands, shopping? 0 0  Preparing Food and eating ? N   Using the Toilet? N   Managing your Medications? N   Managing your Finances? N   Housekeeping or managing your Housekeeping? N     Patient Care Team: Marin Olp, MD as PCP - General (Family Medicine) Ardis Hughs, MD as Attending Physician (Urology) Katheren Puller, RN as Oncology Nurse Navigator  Indicate any recent Medical Services you may have received from other than [facility] providers in the past year (date may be approximate).     Assessment:   This is a routine wellness examination for [place].  Hearing/Vision screen Hearing Screening - Comments:: Pt denies any hearing issues  Vision Screening - Comments:: Pt follows up with Dr Peter Garter for annual eye 4xams   Dietary issues and exercise activities discussed: Current Exercise Habits: Home exercise routine, Type of exercise: Other - see comments, Time (Minutes): 30, Frequency (Times/Week): 2, Weekly Exercise (Minutes/Week): 60   Goals Addressed             This Visit's Progress    Patient Stated       None at this time        Depression Screen    12/23/2021   11:57 AM 03/29/2021    3:30 PM 09/15/2020    8:34 AM 03/26/2019    9:09 AM 08/18/2017    8:04 AM 12/25/2015    9:11 AM 10/21/2014    3:22 PM  PHQ 2/9 Scores  PHQ - 2 Score 0 1 0 0 0 0 0  PHQ- 9 Score  5 0      Exception Documentation       Other- indicate  reason in comment box    Fall Risk    12/23/2021   12:00 PM 03/29/2021   12:58 PM 09/15/2020    8:34 AM 08/18/2017    8:04 AM 12/25/2015    9:11 AM  Fall Risk   Falls in the past year? 0 0 1 No No  Number falls in past yr: 0 0 0    Injury with Fall? 0 0 1    Risk for fall due to : Impaired vision No Fall Risks Impaired balance/gait    Follow up Falls prevention discussed  Falls evaluation completed      FALL RISK PREVENTION PERTAINING TO THE HOME:  Any stairs in or around the home? No   If so, are there any without handrails? No  Home free of loose throw rugs in walkways, pet beds, electrical cords, etc? Yes  Adequate lighting in your home to reduce risk of falls? Yes   ASSISTIVE DEVICES UTILIZED TO PREVENT FALLS:  Life alert? No  Use of a cane, walker or w/c? No  Grab bars in the bathroom? Yes  Shower chair or bench in shower? No  Elevated toilet seat or a handicapped toilet? No   TIMED UP AND GO:  Was the test performed? No .   Cognitive Function:        12/23/2021   12:01 PM  6CIT Screen  What Year? 0 points  What month? 0 points  What time? 0 points  Count back from 20 0 points  Months in reverse 0 points  Repeat phrase 0 points  Total Score 0 points    Immunizations Immunization History  Administered Date(s) Administered   Influenza, High Dose Seasonal PF 08/04/2020   Influenza,inj,Quad PF,6+ Mos 04/22/2013, 08/18/2017, 04/27/2019   PFIZER(Purple Top)SARS-COV-2 Vaccination 08/22/2019, 09/11/2019, 07/31/2020   Pneumococcal Polysaccharide-23 09/15/2020   Td 10/26/2007   Tdap 12/18/2017    TDAP status: Up to date  Flu Vaccine status: Up to date  Pneumococcal vaccine status: Due, Education has been provided regarding the importance of this vaccine. Advised may receive this vaccine at local pharmacy or Health Dept. Aware to provide a copy of the vaccination record if obtained from local pharmacy or Health Dept. Verbalized acceptance and understanding.  Covid-19 vaccine status: Completed vaccines  Qualifies for Shingles Vaccine? Yes   Zostavax completed No   Shingrix Completed?: No.    Education has been provided regarding the importance of this vaccine. Patient has been advised to call insurance company to determine out of pocket expense if they have not yet received this vaccine. Advised may also receive vaccine at local pharmacy or Health Dept. Verbalized acceptance and understanding.  Screening Tests Health Maintenance  Topic Date Due    Zoster Vaccines- Shingrix (1 of 2) Never done   COVID-19 Vaccine (4 - Booster for Pfizer series) 09/25/2020   Pneumonia Vaccine 12+ Years old (2 - PCV) 09/15/2021   INFLUENZA VACCINE  03/01/2022   COLONOSCOPY (Pts 45-21yr Insurance coverage will need to be confirmed)  05/27/2026   TETANUS/TDAP  12/19/2027   Hepatitis C Screening  Completed   HPV VACCINES  Aged Out    Health Maintenance  Health Maintenance Due  Topic Date Due   Zoster Vaccines- Shingrix (1 of 2) Never done   COVID-19 Vaccine (4 - Booster for Pfizer series) 09/25/2020   Pneumonia Vaccine 67 Years old (2 - PCV) 09/15/2021    Colorectal cancer screening: Type of screening: Colonoscopy. Completed 05/27/16. Repeat every 10 years   Additional  Screening:  Hepatitis C Screening: 03/03/16 Completed   Vision Screening: Recommended annual ophthalmology exams for early detection of glaucoma and other disorders of the eye. Is the patient up to date with their annual eye exam?  Yes  Who is the provider or what is the name of the office in which the patient attends annual eye exams? Dr Peter Garter  If pt is not established with a provider, would they like to be referred to a provider to establish care? No .   Dental Screening: Recommended annual dental exams for proper oral hygiene  Community Resource Referral / Chronic Care Management: CRR required this visit?  No   CCM required this visit?  No      Plan:     I have personally reviewed and noted the following in the patient's chart:   Medical and social history Use of alcohol, tobacco or illicit drugs  Current medications and supplements including opioid prescriptions. Patient is not currently taking opioid prescriptions. Functional ability and status Nutritional status Physical activity Advanced directives List of other physicians Hospitalizations, surgeries, and ER visits in previous 12 months Vitals Screenings to include cognitive, depression, and falls Referrals  and appointments  In addition, I have reviewed and discussed with patient certain preventive protocols, quality metrics, and best practice recommendations. A written personalized care plan for preventive services as well as general preventive health recommendations were provided to patient.     Willette Brace, LPN   [id number]   Nurse Notes: None

## 2022-01-06 ENCOUNTER — Ambulatory Visit: Payer: Medicare Other | Admitting: Family Medicine

## 2022-01-11 ENCOUNTER — Encounter: Payer: Self-pay | Admitting: Family Medicine

## 2022-01-11 ENCOUNTER — Ambulatory Visit (INDEPENDENT_AMBULATORY_CARE_PROVIDER_SITE_OTHER): Payer: Medicare Other | Admitting: Family Medicine

## 2022-01-11 VITALS — BP 130/70 | HR 59 | Temp 98.7°F | Ht 67.0 in | Wt 154.6 lb

## 2022-01-11 DIAGNOSIS — E785 Hyperlipidemia, unspecified: Secondary | ICD-10-CM | POA: Diagnosis not present

## 2022-01-11 DIAGNOSIS — I10 Essential (primary) hypertension: Secondary | ICD-10-CM

## 2022-01-11 LAB — CBC WITH DIFFERENTIAL/PLATELET
Basophils Absolute: 0.1 10*3/uL (ref 0.0–0.1)
Basophils Relative: 0.9 % (ref 0.0–3.0)
Eosinophils Absolute: 0.1 10*3/uL (ref 0.0–0.7)
Eosinophils Relative: 1 % (ref 0.0–5.0)
HCT: 48 % (ref 39.0–52.0)
Hemoglobin: 16.3 g/dL (ref 13.0–17.0)
Lymphocytes Relative: 28 % (ref 12.0–46.0)
Lymphs Abs: 1.9 10*3/uL (ref 0.7–4.0)
MCHC: 33.9 g/dL (ref 30.0–36.0)
MCV: 86.7 fl (ref 78.0–100.0)
Monocytes Absolute: 0.6 10*3/uL (ref 0.1–1.0)
Monocytes Relative: 8.1 % (ref 3.0–12.0)
Neutro Abs: 4.2 10*3/uL (ref 1.4–7.7)
Neutrophils Relative %: 62 % (ref 43.0–77.0)
Platelets: 231 10*3/uL (ref 150.0–400.0)
RBC: 5.54 Mil/uL (ref 4.22–5.81)
RDW: 14 % (ref 11.5–15.5)
WBC: 6.8 10*3/uL (ref 4.0–10.5)

## 2022-01-11 LAB — COMPREHENSIVE METABOLIC PANEL
ALT: 9 U/L (ref 0–53)
AST: 12 U/L (ref 0–37)
Albumin: 4.3 g/dL (ref 3.5–5.2)
Alkaline Phosphatase: 48 U/L (ref 39–117)
BUN: 20 mg/dL (ref 6–23)
CO2: 27 mEq/L (ref 19–32)
Calcium: 10 mg/dL (ref 8.4–10.5)
Chloride: 106 mEq/L (ref 96–112)
Creatinine, Ser: 0.96 mg/dL (ref 0.40–1.50)
GFR: 82.07 mL/min (ref >60.00)
Glucose, Bld: 96 mg/dL (ref 70–99)
Potassium: 4 mEq/L (ref 3.5–5.1)
Sodium: 138 mEq/L (ref 135–145)
Total Bilirubin: 0.4 mg/dL (ref 0.2–1.2)
Total Protein: 6.7 g/dL (ref 6.0–8.3)

## 2022-01-11 LAB — LIPID PANEL
Cholesterol: 139 mg/dL (ref 0–200)
HDL: 51.5 mg/dL (ref >39.00)
LDL Cholesterol: 63 mg/dL (ref 0–99)
NonHDL: 87.07
Total CHOL/HDL Ratio: 3
Triglycerides: 121 mg/dL (ref 0.0–149.0)
VLDL: 24.2 mg/dL (ref 0.0–40.0)

## 2022-01-11 NOTE — Progress Notes (Signed)
Phone [phone] In person visit   Subjective:   Tyler [name] is a 66 y.o. year old very pleasant male patient who presents for/with See problem oriented charting Chief Complaint  Patient presents with   Annual Exam    Not fasting.    Gastroesophageal Reflux   Past Medical History-  Patient Active Problem List   Diagnosis Date Noted   Palpitations 11/22/2017    Priority: Medium    Memory difficulties 11/22/2017    Priority: Medium    Essential hypertension 11/21/2014    Priority: Medium    BPH (benign prostatic hyperplasia) 11/21/2014    Priority: Medium    Irritable bowel syndrome 04/17/2008    Priority: Medium    Anxiety state 12/27/2007    Priority: Medium    Hyperlipidemia 10/26/2007    Priority: Medium    Allergic rhinitis 11/21/2014    Priority: Low   Former smoker 11/21/2014    Priority: Low   Plantar fasciitis of left foot 10/22/2014    Priority: Low   Metatarsalgia of both feet 09/26/2014    Priority: Low   Hematuria 10/05/2013    Priority: Low   External hemorrhoids 04/17/2008    Priority: Low   History of colonic polyps 04/16/2008    Priority: Low   Asthma 12/27/2007    Priority: Low   GERD 12/27/2007    Priority: Low   Rosacea 10/26/2007    Priority: Low   Genetic testing 08/23/2021   Prostate cancer ([place]) 08/13/2021   Family history of breast cancer 08/13/2021   Family history of prostate cancer 08/13/2021    Medications- reviewed and updated Current Outpatient Medications  Medication Sig Dispense Refill   ALPRAZolam (XANAX) 0.25 MG tablet Take 1 tablet (0.25 mg total) by mouth 2 (two) times daily as needed for anxiety (do not drive for 6-8 hours after taking). 30 tablet 2   bisoprolol (ZEBETA) 5 MG tablet TAKE 1/2 TABLET(2.5 MG) BY MOUTH DAILY 45 tablet 3   busPIRone (BUSPAR) 5 MG tablet TAKE 1 TABLET(5 MG) BY MOUTH TWICE DAILY 60 tablet 5   docusate sodium (COLACE) 100 MG capsule Take 300 mg by mouth at bedtime.     FIBER PO Take 3  tablets by mouth daily.     omeprazole (PRILOSEC) 20 MG capsule TAKE 1 CAPSULE(20 MG) BY MOUTH DAILY 90 capsule 3   Pramoxine-HC (HYDROCORTISONE ACE-PRAMOXINE) 2.5-1 % CREA Apply 1 application topically 2 (two) times daily as needed. 30 g 0   rosuvastatin (CRESTOR) 20 MG tablet TAKE 1 TABLET(20 MG) BY MOUTH 1 TIME A WEEK 14 tablet 1   sildenafil (REVATIO) 20 MG tablet Take 20 mg by mouth at bedtime.     No current facility-administered medications for this visit.     Objective:  BP 130/70   Pulse (!) 59   Temp 98.7 F (37.1 C)   Ht '5\' 7"'$  (1.702 m)   Wt 154 lb 9.6 oz (70.1 kg)   SpO2 96%   BMI 24.21 kg/m  Gen: NAD, resting comfortably CV: RRR no murmurs rubs or gallops Lungs: CTAB no crackles, wheeze, rhonchi Abdomen: soft/nontender/nondistended/normal bowel sounds. No rebound or guarding.  Ext: no edema Skin: warm, dry Neuro: grossly normal, moves all extremities      Assessment and Plan   #hypertension S: medication: Bisoprolol 5 mg - just started riding again but was able to do 15 miles -similar readings at home BP Readings from Last 3 Encounters:  01/11/22 130/70  09/24/21 (!) 147/87  09/07/21 (!) 142/92  A/P: Controlled. Continue current medications.   #hyperlipidemia S: Medication: Rosuvastatin 20 mg Lab Results  Component Value Date   CHOL 129 09/15/2020   HDL 42.40 09/15/2020   LDLCALC 69 09/15/2020   LDLDIRECT 152.5 11/18/2008   TRIG 88.0 09/15/2020   CHOLHDL 3 09/15/2020   A/P: Excellent control last check. Update lipids today- continue current meds for now  #Anxiety- can present with agitation S: Medication: Buspirone 5 mg twice daily as needed- twice a day some days, occasionally just once a day, Xanax 0.25 mg twice daily as needed for flying/doctor visit/problem speaking A/P: reasonable control- we discussed could even take a 3rd dose (either separate or added to one of his current doses) if needed on particularly stressful days especially since  other days only needs 1 and would still have plenty- we could still send in higher dose if needed.   -alprazolam mainly travel and doctors visits  #Allergies- allegra as needed if worse. Not needed flonase in some time   #Health maintenance 1.  Cancer screenings -continues follow-up with urology due to prostate cancer history with prostatectomy 09/23/2021. Post procedure doing sildenafil 20 mg daily more if needed and penis pump a few times a week for post procedural ED . Last seen 11/24/21 with 5 month follow up . Still doing PT - down 5 pads a day down from 6.  - colonoscopy 05/27/16 with 10 year follow up  - former smoker but quit in 1970s- no screening needed  2.  Immunizations- up to date  other than shingrix- recommended at pharmacy, he prefers prevnar 20 five years out Immunization History  Administered Date(s) Administered   Influenza, High Dose Seasonal PF 08/04/2020   Influenza,inj,Quad PF,6+ Mos 04/22/2013, 08/18/2017, 04/27/2019   PFIZER(Purple Top)SARS-COV-2 Vaccination 08/22/2019, 09/11/2019, 05/14/2020, 02/19/2021   Pfizer Covid-19 Vaccine Bivalent Booster 14yr & up 07/17/2021   Pneumococcal Polysaccharide-23 09/15/2020   Td 10/26/2007   Tdap 12/18/2017    Recommended follow up: No follow-ups on file. Future Appointments  Date Time Provider DElkhorn 12/29/2022 12:00 PM [facility]   Lab/Order associations: NOT fasting   ICD-10-CM   1. Essential hypertension  I10 CBC with Differential/Platelet    Comprehensive metabolic panel    Lipid panel    2. Hyperlipidemia, unspecified hyperlipidemia type  E78.5 CBC with Differential/Platelet    Comprehensive metabolic panel    Lipid panel      No orders of the defined types were placed in this encounter.   Return precautions advised.  SGarret Reddish MD

## 2022-01-11 NOTE — Patient Instructions (Addendum)
Health Maintenance Due  Topic Date Due   Pneumonia Vaccine 75+ Years old (2 - PCV) 09/15/2021  Prevnar 20 can do at pharmacy or we can do at some pont such as 5 years out from 2022 pneumonia shot  Please check with your pharmacy to see if they have the shingrix vaccine. If they do- please get this immunization and update [place] by phone call or mychart with dates you receive the vaccine  Recommended follow up: Return in about 1 year (around 01/12/2023) for followup or sooner if needed.Schedule b4 you leave.

## 2022-03-02 ENCOUNTER — Encounter: Payer: Self-pay | Admitting: Gastroenterology

## 2022-04-25 ENCOUNTER — Encounter: Payer: Self-pay | Admitting: *Deleted

## 2022-05-11 ENCOUNTER — Encounter: Payer: Self-pay | Admitting: Gastroenterology

## 2022-05-11 ENCOUNTER — Ambulatory Visit (INDEPENDENT_AMBULATORY_CARE_PROVIDER_SITE_OTHER): Payer: Medicare Other | Admitting: Gastroenterology

## 2022-05-11 VITALS — BP 130/82 | HR 80 | Ht 67.0 in | Wt 155.4 lb

## 2022-05-11 DIAGNOSIS — R142 Eructation: Secondary | ICD-10-CM

## 2022-05-11 DIAGNOSIS — K219 Gastro-esophageal reflux disease without esophagitis: Secondary | ICD-10-CM

## 2022-05-11 DIAGNOSIS — R1310 Dysphagia, unspecified: Secondary | ICD-10-CM

## 2022-05-11 MED ORDER — OMEPRAZOLE 40 MG PO CPDR: 40.0000 mg | DELAYED_RELEASE_CAPSULE | Freq: Every day | ORAL | 11 refills | Status: DC

## 2022-05-11 NOTE — Progress Notes (Signed)
Assessment    Dysphagia, belching - R/O esophagitis, stricture, inadequately controlled GERD, aerophagia IBS, inactive  Recommendations   Schedule barium esophagram and EGD. The risks (including bleeding, perforation, infection, missed lesions, medication reactions and possible hospitalization or surgery if complications occur), benefits, and alternatives to endoscopy with possible biopsy and possible dilation were discussed with the patient and they consent to proceed.   Increase omeprazole to 40 mg qd for 8 weeks, antireflux measures, low gas diet, Gaviscon qid prn   HPI   Chief complaint: Dysphagia,   Patient profile:  Tyler [name] is a 66 y.o. male referred by Garret Reddish, MD for evaluation of dysphagia and belching.  He has a history of GERD and states his heartburn symptoms are controlled on daily omeprazole.  For the past few months he has noted frequent episodes of belching often following meals but also occurring at other times.  He relates that if he takes an additional dose of omeprazole his belching improves.  He also notes intermittent difficulties with solid food dysphagia.  His symptoms are infrequent and have not progressed in severity or frequency.  He has a history of prostate cancer and is status post prostatectomy in February 2023. Denies weight loss, abdominal pain, constipation, diarrhea, change in stool caliber, melena, hematochezia, nausea, vomiting, chest pain.     Previous Labs / Imaging::    Latest Ref Rng & Units 01/11/2022   11:47 AM 09/24/2021    3:39 AM 09/23/2021   12:37 PM  CBC  WBC 4.0 - 10.5 K/uL 6.8     Hemoglobin 13.0 - 17.0 g/dL 16.3  15.2  16.1   Hematocrit 39.0 - 52.0 % 48.0  46.6  49.4   Platelets 150.0 - 400.0 K/uL 231.0       No results found for: "LIPASE"    Latest Ref Rng & Units 01/11/2022   11:47 AM 09/24/2021    3:39 AM 09/07/2021    8:56 AM  CMP  Glucose 70 - 99 mg/dL 96  123  101   BUN 6 - 23 mg/dL '20  10  20    '$ Creatinine 0.40 - 1.50 mg/dL 0.96  1.10  1.00   Sodium 135 - 145 mEq/L 138  136  137   Potassium 3.5 - 5.1 mEq/L 4.0  4.1  3.4   Chloride 96 - 112 mEq/L 106  103  105   CO2 19 - 32 mEq/L '27  27  25   '$ Calcium 8.4 - 10.5 mg/dL 10.0  8.4  9.1   Total Protein 6.0 - 8.3 g/dL 6.7   7.1   Total Bilirubin 0.2 - 1.2 mg/dL 0.4   0.5   Alkaline Phos 39 - 117 U/L 48   44   AST 0 - 37 U/L 12   21   ALT 0 - 53 U/L 9   18      Previous GI evaluation    Endoscopies:  Colonoscopy October 2017 for CRC screening: - Internal hemorrhoids. - The examination was otherwise normal on direct and retroflexion views.  Imaging:  MR PROSTATE W WO CONTRAST CLINICAL DATA:  Elevated PSA level of 5.38 on 05/19/2021  EXAM: MR PROSTATE WITHOUT AND WITH CONTRAST  TECHNIQUE: Multiplanar multisequence MRI images were obtained of the pelvis centered about the prostate. Pre and post contrast images were obtained.  CONTRAST:  91m MULTIHANCE GADOBENATE DIMEGLUMINE 529 MG/ML IV SOLN  COMPARISON:  None.  FINDINGS: Prostate:  Region of interest # 1: PI-RADS  category 4 lesion of the left anterior peripheral zone and left anterior fibromuscular stroma at the apex with focally reduced T2 signal, focal early enhancement, and some mild restricted diffusion. This measures 0.37 cc (1.2 by 0.7 by 0.9 cm) and is shown for example on image 52 series 9 and image 135 series 12.  Ill-defined low T2 signal scattered throughout the peripheral zone of the prostate gland in a nonfocal manner, probably postinflammatory and considered PI-RADS category 2.  Volume: 3D volumetric analysis: Prostate volume 41.01 cc (5.1 by 3.7 by 4.5 cm).  Transcapsular spread:  Absent  Seminal vesicle involvement: Absent  Neurovascular bundle involvement: Absent  Pelvic adenopathy: Absent  Bone metastasis: Absent  Other findings: Sigmoid colon diverticulosis.  IMPRESSION: 1. PI-RADS category 4 lesion of the left anterior  peripheral zone and adjacent left anterior fibromuscular stroma at the apex. Targeting data sent to [place]. 2. Nonfocal scattered low T2 signal throughout the peripheral zone of the prostate gland, probably postinflammatory and considered PI-RADS category 2. 3. Sigmoid colon diverticulosis.  Electronically Signed   By: Van Clines M.D.   On: 06/21/2021 09:43    Past Medical History:  Diagnosis Date   Allergy    Anxiety    Asthma    Cataract    beginning   Colon polyps    Depression    Family history of adverse reaction to anesthesia    GERD (gastroesophageal reflux disease)    Hematuria 10/05/2013   Hemorrhoids    Hyperlipidemia    Hypertension    IBS (irritable bowel syndrome)    alt diarrhea/ constipation   Rosacea    Past Surgical History:  Procedure Laterality Date   cataract surgery      COLONOSCOPY  07/28/2006   LYMPH NODE DISSECTION Bilateral 09/23/2021   Procedure: PELVIC LYMPH NODE DISSECTION;  Surgeon: Ardis Hughs, MD;  Location: [facility];  Service: Urology;  Laterality: Bilateral;   ROBOT ASSISTED LAPAROSCOPIC RADICAL PROSTATECTOMY N/A 09/23/2021   Procedure: XI ROBOTIC ASSISTED LAPAROSCOPIC RADICAL PROSTATECTOMY;  Surgeon: Ardis Hughs, MD;  Location: [facility];  Service: Urology;  Laterality: N/A;  4.5 HRS   Family History  Problem Relation Age of Onset   Glaucoma Mother        and father   Coronary artery disease Mother        late 78s-cabg. died at 72   Breast cancer Mother        dx. 51s   Coronary artery disease Father 32       death 32, presumed due to MI as sudden   Stomach cancer Maternal Uncle    Breast cancer Paternal Aunt    Prostate cancer Paternal Grandfather        dx. 80s   Colon cancer Neg Hx    Colon polyps Neg Hx    Esophageal cancer Neg Hx    Rectal cancer Neg Hx    Social History   Tobacco Use   Smoking status: Former    Packs/day: 0.75    Years: 4.00    Total pack years: 3.00    Types: Cigarettes     Quit date: 08/01/1973    Years since quitting: 48.8   Smokeless tobacco: Never  Vaping Use   Vaping Use: Never used  Substance Use Topics   Alcohol use: Yes    Alcohol/week: 0.0 - 3.0 standard drinks of alcohol    Comment: social   Drug use: No   Current Outpatient Medications  Medication Sig  Dispense Refill   ALPRAZolam (XANAX) 0.25 MG tablet Take 1 tablet (0.25 mg total) by mouth 2 (two) times daily as needed for anxiety (do not drive for 6-8 hours after taking). 30 tablet 2   bisoprolol (ZEBETA) 5 MG tablet TAKE 1/2 TABLET(2.5 MG) BY MOUTH DAILY 45 tablet 3   busPIRone (BUSPAR) 5 MG tablet TAKE 1 TABLET(5 MG) BY MOUTH TWICE DAILY 60 tablet 5   docusate sodium (COLACE) 100 MG capsule Take 300 mg by mouth at bedtime.     FIBER PO Take 3 tablets by mouth daily.     omeprazole (PRILOSEC) 20 MG capsule TAKE 1 CAPSULE(20 MG) BY MOUTH DAILY 90 capsule 3   Pramoxine-HC (HYDROCORTISONE ACE-PRAMOXINE) 2.5-1 % CREA Apply 1 application topically 2 (two) times daily as needed. 30 g 0   rosuvastatin (CRESTOR) 20 MG tablet TAKE 1 TABLET(20 MG) BY MOUTH 1 TIME A WEEK 14 tablet 1   sildenafil (REVATIO) 20 MG tablet Take 20 mg by mouth at bedtime.     No current facility-administered medications for this visit.   Allergies  Allergen Reactions   Penicillins     Mouth sores    Review of Systems: All other systems reviewed and negative except where noted in HPI.    Physical Exam    Wt Readings from Last 3 Encounters:  01/11/22 154 lb 9.6 oz (70.1 kg)  09/23/21 151 lb (68.5 kg)  09/07/21 151 lb (68.5 kg)    There were no vitals taken for this visit. Constitutional:  Generally well appearing male in no acute distress. Psychiatric: Pleasant. Normal mood and affect. Behavior is normal. HEENT: Pupils normal.  Conjunctivae are normal. No scleral icterus. Neck supple.  Cardiovascular: Normal rate, regular rhythm. No edema Pulmonary/chest: Effort normal and breath sounds normal. No wheezing,  rales or rhonchi. Abdominal: Soft, nondistended, nontender. Bowel sounds active throughout. There are no masses palpable. No hepatomegaly. Rectal: Not done Neurological: Alert and oriented to person place and time. Skin: Skin is warm and dry. No rashes noted.  Lucio Edward, MD   cc:  Referring Provider Marin Olp, MD

## 2022-05-11 NOTE — Patient Instructions (Addendum)
Increase your omeprazole to 40 mg daily. A new prescription has been sent to your pharmacy.   Patient advised to avoid spicy, acidic, citrus, chocolate, mints, fruit and fruit juices.  Limit the intake of caffeine, alcohol and Soda.  Don't exercise too soon after eating.  Don't lie down within 3-4 hours of eating.  Elevate the head of your bed.   You can take over the counter Gaviscon as needed for gas and bloating.   You have been given a low gas diet.   You have been scheduled for a Barium Esophogram at [facility] (1st floor of the hospital) on 05/16/22 at 9:00 am. Please arrive 30 minutes prior to your appointment for registration. Make certain not to have anything to eat or drink 3 hours prior to your test. If you need to reschedule for any reason, please contact radiology at [phone] to do so. ___________________________________________________________ A barium swallow is an examination that concentrates on views of the esophagus. This tends to be a double contrast exam (barium and two liquids which, when combined, create a gas to distend the wall of the oesophagus) or single contrast (non-ionic iodine based). The study is usually tailored to your symptoms so a good history is essential. Attention is paid during the study to the form, structure and configuration of the esophagus, looking for functional disorders (such as aspiration, dysphagia, achalasia, motility and reflux) EXAMINATION You may be asked to change into a gown, depending on the type of swallow being performed. A radiologist and radiographer will perform the procedure. The radiologist will advise you of the type of contrast selected for your procedure and direct you during the exam. You will be asked to stand, sit or lie in several different positions and to hold a small amount of fluid in your mouth before being asked to swallow while the imaging is performed .In some instances you may be asked to swallow barium coated  marshmallows to assess the motility of a solid food bolus. The exam can be recorded as a digital or video fluoroscopy procedure. POST PROCEDURE It will take 1-2 days for the barium to pass through your system. To facilitate this, it is important, unless otherwise directed, to increase your fluids for the next 24-48hrs and to resume your normal diet.  This test typically takes about 30 minutes to perform. ___________________________________________________________ Dennis Bast have been scheduled for an endoscopy. Please follow written instructions given to you at your visit today. If you use inhalers (even only as needed), please bring them with you on the day of your procedure.  The  GI providers would like to encourage you to use [facility] to communicate with providers for non-urgent requests or questions.  Due to long hold times on the telephone, sending your provider a message by [facility] may be a faster and more efficient way to get a response.  Please allow 48 business hours for a response.  Please remember that this is for non-urgent requests.   Due to recent changes in healthcare laws, you may see the results of your imaging and laboratory studies on MyChart before your provider has had a chance to review them.  We understand that in some cases there may be results that are confusing or concerning to you. Not all laboratory results come back in the same time frame and the provider may be waiting for multiple results in order to interpret others.  Please give [place] 48 hours in order for your provider to thoroughly review all the results  before contacting the office for clarification of your results.   Thank you for choosing me and [facility].  Pricilla Riffle. Dagoberto Ligas., MD., Marval Regal

## 2022-05-16 ENCOUNTER — Other Ambulatory Visit: Payer: Self-pay | Admitting: Family Medicine

## 2022-05-16 ENCOUNTER — Ambulatory Visit (HOSPITAL_COMMUNITY)
Admission: RE | Admit: 2022-05-16 | Discharge: 2022-05-16 | Disposition: A | Discharge status: Home / Self Care | Payer: Medicare Other | Source: Ambulatory Visit | Attending: Gastroenterology | Admitting: Gastroenterology

## 2022-05-16 DIAGNOSIS — K219 Gastro-esophageal reflux disease without esophagitis: Secondary | ICD-10-CM | POA: Diagnosis present

## 2022-05-16 DIAGNOSIS — R1310 Dysphagia, unspecified: Secondary | ICD-10-CM | POA: Diagnosis present

## 2022-05-16 DIAGNOSIS — R142 Eructation: Secondary | ICD-10-CM | POA: Insufficient documentation

## 2022-05-21 ENCOUNTER — Encounter: Payer: Self-pay | Admitting: Certified Registered Nurse Anesthetist

## 2022-05-26 ENCOUNTER — Ambulatory Visit (AMBULATORY_SURGERY_CENTER): Payer: Medicare Other | Admitting: Gastroenterology

## 2022-05-26 ENCOUNTER — Other Ambulatory Visit: Payer: Self-pay | Admitting: Gastroenterology

## 2022-05-26 ENCOUNTER — Encounter: Payer: Self-pay | Admitting: Gastroenterology

## 2022-05-26 VITALS — BP 115/79 | HR 76 | Temp 98.0°F | Resp 12 | Ht 67.0 in | Wt 155.0 lb

## 2022-05-26 DIAGNOSIS — K222 Esophageal obstruction: Secondary | ICD-10-CM

## 2022-05-26 DIAGNOSIS — K2100 Gastro-esophageal reflux disease with esophagitis, without bleeding: Secondary | ICD-10-CM

## 2022-05-26 DIAGNOSIS — R933 Abnormal findings on diagnostic imaging of other parts of digestive tract: Secondary | ICD-10-CM

## 2022-05-26 DIAGNOSIS — R1310 Dysphagia, unspecified: Secondary | ICD-10-CM | POA: Diagnosis not present

## 2022-05-26 DIAGNOSIS — K449 Diaphragmatic hernia without obstruction or gangrene: Secondary | ICD-10-CM

## 2022-05-26 DIAGNOSIS — K31819 Angiodysplasia of stomach and duodenum without bleeding: Secondary | ICD-10-CM | POA: Diagnosis not present

## 2022-05-26 MED ORDER — SODIUM CHLORIDE 0.9 % IV SOLN: 500.0000 mL | Freq: Once | INTRAVENOUS | Status: DC

## 2022-05-26 NOTE — Op Note (Signed)
[facility] Patient Name: Tyler [name] Procedure Date: 05/26/2022 8:22 AM MRN: [id number] Endoscopist: Ladene Artist , MD, [id number] Age: 66 Referring MD:  Date of Birth: [birth year] Gender: Male Account #: [id number] Procedure:                Upper GI endoscopy Indications:              Dysphagia, Abnormal esophagram Medicines:                Monitored Anesthesia Care Procedure:                Pre-Anesthesia Assessment:                           - Prior to the procedure, a History and Physical                            was performed, and patient medications and                            allergies were reviewed. The patient's tolerance of                            previous anesthesia was also reviewed. The risks                            and benefits of the procedure and the sedation                            options and risks were discussed with the patient.                            All questions were answered, and informed consent                            was obtained. Prior Anticoagulants: The patient has                            taken no anticoagulant or antiplatelet agents. ASA                            Grade Assessment: II - A patient with mild systemic                            disease. After reviewing the risks and benefits,                            the patient was deemed in satisfactory condition to                            undergo the procedure.                           After obtaining informed consent, the endoscope was  passed under direct vision. Throughout the                            procedure, the patient's blood pressure, pulse, and                            oxygen saturations were monitored continuously. The                            GIF [id number] #[id number] was introduced through the                            mouth, and advanced to the second part of duodenum.                            The upper GI endoscopy  was accomplished without                            difficulty. The patient tolerated the procedure                            well. Scope In: Scope Out: Findings:                 LA Grade A (one or more mucosal breaks less than 5                            mm, not extending between tops of 2 mucosal folds)                            esophagitis with no bleeding was found at the                            gastroesophageal junction.                           One benign-appearing, intrinsic mild stenosis was                            found at the gastroesophageal junction. This                            stenosis measured 1.4 cm (inner diameter) x less                            than one cm (in length). The stenosis was                            traversed. A guidewire was placed and the scope was                            withdrawn. Dilation was performed with a Savary  dilator with mild resistance at 17 mm.                           The exam of the esophagus was otherwise normal.                           A small hiatal hernia was present.                           Diffuse mildly erythematous mucosa without bleeding                            was found in the gastric fundus and in the gastric                            body. Biopsies were taken with a cold forceps for                            histology.                           The exam of the stomach was otherwise normal.                           The duodenal bulb and second portion of the                            duodenum were normal. Complications:            No immediate complications. Estimated Blood Loss:     Estimated blood loss was minimal. Impression:               - LA Grade A reflux esophagitis with no bleeding.                           - Benign-appearing esophageal stenosis. Dilated.                           - Small hiatal hernia.                           - Erythematous mucosa in the  gastric fundus and                            gastric body. Biopsied.                           - Normal duodenal bulb and second portion of the                            duodenum. Recommendation:           - Patient has a contact number available for                            emergencies. The signs and symptoms of potential  delayed complications were discussed with the                            patient. Return to normal activities tomorrow.                            Written discharge instructions were provided to the                            patient.                           - Clear liquid diet for 2 hours, then advance as                            tolerated to soft diet today.                           - Resume prior die tomorrow.                           - Follow antireflux measures long term.                           - Continue present medications including omeprazole                            40 mg po qd.                           - Await pathology results. Ladene Artist, MD 05/26/2022 8:35:34 AM This report has been signed electronically.

## 2022-05-26 NOTE — Progress Notes (Signed)
See 05/11/2022 H&P, no changes

## 2022-05-26 NOTE — Progress Notes (Signed)
Report given to PACU, vss 

## 2022-05-26 NOTE — Patient Instructions (Signed)
Please read handouts provided. Continue present medications, including omeprazole 40 mg everyday. Await pathology results. Dilation Diet. Follow antireflux measures long term.  YOU HAD AN ENDOSCOPIC PROCEDURE TODAY AT [facility]:   Refer to the procedure report that was given to you for any specific questions about what was found during the examination.  If the procedure report does not answer your questions, please call your gastroenterologist to clarify.  If you requested that your care partner not be given the details of your procedure findings, then the procedure report has been included in a sealed envelope for you to review at your convenience later.  YOU SHOULD EXPECT: Some feelings of bloating in the abdomen. Passage of more gas than usual.  Walking can help get rid of the air that was put into your GI tract during the procedure and reduce the bloating. If you had a lower endoscopy (such as a colonoscopy or flexible sigmoidoscopy) you may notice spotting of blood in your stool or on the toilet paper. If you underwent a bowel prep for your procedure, you may not have a normal bowel movement for a few days.  Please Note:  You might notice some irritation and congestion in your nose or some drainage.  This is from the oxygen used during your procedure.  There is no need for concern and it should clear up in a day or so.  SYMPTOMS TO REPORT IMMEDIATELY:   Following upper endoscopy (EGD)  Vomiting of blood or coffee ground material  New chest pain or pain under the shoulder blades  Painful or persistently difficult swallowing  New shortness of breath  Fever of 100F or higher  Black, tarry-looking stools  For urgent or emergent issues, a gastroenterologist can be reached at any hour by calling [phone]. Do not use MyChart messaging for urgent concerns.    DIET:  Drink plenty of fluids but you should avoid alcoholic beverages for 24 hours.  ACTIVITY:  You should  plan to take it easy for the rest of today and you should NOT DRIVE or use heavy machinery until tomorrow (because of the sedation medicines used during the test).    FOLLOW UP: Our staff will call the number listed on your records the next business day following your procedure.  We will call around 7:15- 8:00 am to check on you and address any questions or concerns that you may have regarding the information given to you following your procedure. If we do not reach you, we will leave a message.     If any biopsies were taken you will be contacted by phone or by letter within the next 1-3 weeks.  Please call us at [phone] if you have not heard about the biopsies in 3 weeks.    SIGNATURES/CONFIDENTIALITY: You and/or your care partner have signed paperwork which will be entered into your electronic medical record.  These signatures attest to the fact that that the information above on your After Visit Summary has been reviewed and is understood.  Full responsibility of the confidentiality of this discharge information lies with you and/or your care-partner.

## 2022-05-26 NOTE — Progress Notes (Signed)
0820 Robinul 0.1 mg IV given due large amount of secretions upon assessment.  MD made aware, vss

## 2022-05-27 ENCOUNTER — Telehealth: Payer: Self-pay | Admitting: *Deleted

## 2022-05-27 NOTE — Telephone Encounter (Signed)
  Follow up Call-     05/26/2022    7:57 AM  Call back number  Post procedure Call Back phone  # [phone]  Permission to leave phone message Yes     Patient questions:  Do you have a fever, pain , or abdominal swelling? No. Pain Score  0 *  Have you tolerated food without any problems? Yes.    Have you been able to return to your normal activities? Yes.    Do you have any questions about your discharge instructions: Diet   No. Medications  No. Follow up visit  No.  Do you have questions or concerns about your Care? No.  Actions: * If pain score is 4 or above: No action needed, pain <4.

## 2022-05-28 ENCOUNTER — Telehealth: Payer: Self-pay | Admitting: Family Medicine

## 2022-06-09 ENCOUNTER — Encounter: Payer: Self-pay | Admitting: Gastroenterology

## 2022-06-16 ENCOUNTER — Ambulatory Visit (INDEPENDENT_AMBULATORY_CARE_PROVIDER_SITE_OTHER): Payer: Medicare Other | Admitting: Family

## 2022-06-16 ENCOUNTER — Encounter: Payer: Self-pay | Admitting: Family

## 2022-06-16 VITALS — BP 148/87 | HR 76 | Temp 98.2°F | Ht 67.0 in | Wt 157.0 lb

## 2022-06-16 DIAGNOSIS — J020 Streptococcal pharyngitis: Secondary | ICD-10-CM

## 2022-06-16 DIAGNOSIS — R053 Chronic cough: Secondary | ICD-10-CM

## 2022-06-16 LAB — POCT RAPID STREP A (OFFICE): Rapid Strep A Screen: POSITIVE — AB

## 2022-06-16 MED ORDER — AZITHROMYCIN 250 MG PO TABS: ORAL_TABLET | ORAL | 0 refills | Status: AC

## 2022-06-16 MED ORDER — HYDROCOD POLI-CHLORPHE POLI ER 10-8 MG/5ML PO SUER: 5.0000 mL | Freq: Two times a day (BID) | ORAL | 0 refills | Status: DC | PRN

## 2022-06-16 NOTE — Progress Notes (Signed)
Patient ID: Tyler [name], male    DOB: 1955/09/27, 66 y.o.   MRN: [id number]  Chief Complaint  Patient presents with   Cough    Pt states sx started Sunday, pt stated he taken tylenol and cough medicine. Pt took covid test on 2 different days, both neg   Sore Throat   Nasal Congestion    HPI:      URI sx:  Pt states sx started Sunday,  he has taken tylenol and RX cough medicine. Pt took covid test on 2 different days, both neg. Denies any known fever, has felt warm, no chills, no nausea, nasal drg and congestion, dry cough.       Assessment & Plan:  1. Strep throat - sending Zpack, pt allergic to PCN, advised on use & SE,  Advised ok to take Ibuprofen '600mg'$  3 times per day for sore throat pain, swelling, and fever. Gargle with warm salt water several times per day. OK to use over the counter Chloraseptic spray and/or throat lozenges as needed. Drink plenty of water.  - POCT rapid strep A - azithromycin (ZITHROMAX) 250 MG tablet; Take 2 tablets on day 1, then 1 tablet daily on days 2 through 5  Dispense: 6 tablet; Refill: 0  2. Persistent cough - pt prefers Tussionex over Hycodan syrup, advised to call back if out of stock. Drink plenty of fluids,   - chlorpheniramine-HYDROcodone (TUSSIONEX) 10-8 MG/5ML; Take 5 mLs by mouth every 12 (twelve) hours as needed for cough.  Dispense: 115 mL; Refill: 0   Subjective:    Outpatient Medications Prior to Visit  Medication Sig Dispense Refill   ALPRAZolam (XANAX) 0.25 MG tablet Take 1 tablet (0.25 mg total) by mouth 2 (two) times daily as needed for anxiety (do not drive for 6-8 hours after taking). 30 tablet 2   bisoprolol (ZEBETA) 5 MG tablet TAKE 1/2 TABLET(2.5 MG) BY MOUTH DAILY 45 tablet 3   busPIRone (BUSPAR) 5 MG tablet TAKE 1 TABLET(5 MG) BY MOUTH TWICE DAILY 60 tablet 5   docusate sodium (COLACE) 100 MG capsule Take 300 mg by mouth at bedtime.     FIBER PO Take 3 tablets by mouth daily.     mometasone (ELOCON) 0.1 % cream Apply  topically.     omeprazole (PRILOSEC) 40 MG capsule Take 1 capsule (40 mg total) by mouth daily. 30 capsule 11   Pramoxine-HC (HYDROCORTISONE ACE-PRAMOXINE) 2.5-1 % CREA Apply 1 application topically 2 (two) times daily as needed. 30 g 0   rosuvastatin (CRESTOR) 20 MG tablet TAKE 1 TABLET(20 MG) BY MOUTH 1 TIME A WEEK 14 tablet 1   sildenafil (REVATIO) 20 MG tablet Take 20 mg by mouth at bedtime.     triamcinolone cream (KENALOG) 0.1 % SMARTSIG:sparingly Topical Daily     No facility-administered medications prior to visit.   Past Medical History:  Diagnosis Date   Allergy    Anxiety    Asthma    Cataract    beginning   Colon polyps    Depression    Family history of adverse reaction to anesthesia    GERD (gastroesophageal reflux disease)    Hematuria 10/05/2013   Hemorrhoids    Hyperlipidemia    Hypertension    IBS (irritable bowel syndrome)    alt diarrhea/ constipation   Rosacea    Past Surgical History:  Procedure Laterality Date   cataract surgery      COLONOSCOPY  07/28/2006   LYMPH NODE DISSECTION Bilateral  09/23/2021   Procedure: PELVIC LYMPH NODE DISSECTION;  Surgeon: Ardis Hughs, MD;  Location: [facility];  Service: Urology;  Laterality: Bilateral;   ROBOT ASSISTED LAPAROSCOPIC RADICAL PROSTATECTOMY N/A 09/23/2021   Procedure: XI ROBOTIC ASSISTED LAPAROSCOPIC RADICAL PROSTATECTOMY;  Surgeon: Ardis Hughs, MD;  Location: [facility];  Service: Urology;  Laterality: N/A;  4.5 HRS   Allergies  Allergen Reactions   Penicillins     Mouth sores      Objective:    Physical Exam Vitals and nursing note reviewed.  Constitutional:      General: He is not in acute distress.    Appearance: Normal appearance. He is ill-appearing.  HENT:     Head: Normocephalic.     Right Ear: Tympanic membrane and ear canal normal.     Left Ear: Tympanic membrane and ear canal normal.     Nose:     Right Sinus: No maxillary sinus tenderness or frontal sinus tenderness.     Left  Sinus: No maxillary sinus tenderness or frontal sinus tenderness.     Mouth/Throat:     Mouth: Mucous membranes are moist.     Pharynx: No pharyngeal swelling, oropharyngeal exudate, posterior oropharyngeal erythema or uvula swelling.     Tonsils: No tonsillar exudate or tonsillar abscesses.  Cardiovascular:     Rate and Rhythm: Normal rate and regular rhythm.  Pulmonary:     Effort: Pulmonary effort is normal.     Breath sounds: Normal breath sounds.  Musculoskeletal:        General: Normal range of motion.     Cervical back: Normal range of motion.  Lymphadenopathy:     Head:     Right side of head: No preauricular or posterior auricular adenopathy.     Left side of head: No preauricular or posterior auricular adenopathy.     Cervical: Cervical adenopathy present.     Right cervical: Superficial cervical adenopathy present.     Left cervical: Superficial cervical adenopathy present.  Skin:    General: Skin is warm and dry.  Neurological:     Mental Status: He is alert and oriented to person, place, and time.  Psychiatric:        Mood and Affect: Mood normal.    BP (!) 148/87 (BP Location: Left Arm, Patient Position: Sitting)   Pulse 76   Temp 98.2 F (36.8 C) (Temporal)   Ht '5\' 7"'$  (1.702 m)   Wt 157 lb (71.2 kg)   SpO2 98%   BMI 24.59 kg/m  Wt Readings from Last 3 Encounters:  06/16/22 157 lb (71.2 kg)  05/26/22 155 lb (70.3 kg)  05/11/22 155 lb 6 oz (70.5 kg)       Jeanie Sewer, NP

## 2022-06-22 MED ORDER — ROSUVASTATIN CALCIUM 20 MG PO TABS: ORAL_TABLET | ORAL | 3 refills | Status: DC

## 2022-06-22 NOTE — Telephone Encounter (Signed)
Rx resent.

## 2022-06-22 NOTE — Telephone Encounter (Signed)
Pt states: -he is out of this medication -First missed dose will be 06/26/22 -pharmacy "cannot find" the rx that was sent on 10/30  Pt asks: -Can Rx be resent?   Pt requests: -Call or MyChart message when Rx is resent   rosuvastatin (CRESTOR) 20 MG tablet [[id number]]    [facility], [address] AT [address], [place] [address] Phone: [phone]  Fax: [phone] DEA #: [id number]

## 2022-07-13 ENCOUNTER — Encounter: Payer: Self-pay | Admitting: Gastroenterology

## 2022-07-20 ENCOUNTER — Telehealth: Payer: Self-pay | Admitting: Family Medicine

## 2022-07-20 ENCOUNTER — Other Ambulatory Visit: Payer: Self-pay

## 2022-07-20 MED ORDER — BISOPROLOL FUMARATE 5 MG PO TABS: 5.0000 mg | ORAL_TABLET | Freq: Every day | ORAL | 3 refills | Status: DC

## 2022-07-20 NOTE — Telephone Encounter (Signed)
Rx sent to per pharmacy and other pharmacy deleted

## 2022-07-20 NOTE — Telephone Encounter (Signed)
  Patient states new PREFERRED PHARMACY is:  [facility] located at [employer], on [address]:  Please schedule appointment if longer than 1 year  01/11/22  NEXT APPOINTMENT DATE: 01/12/23  MEDICATION:  bisoprolol (ZEBETA) 5 MG tablet    Is the patient out of medication? Will be out before Christmas and is leaving town 07/21/22 pm  PHARMACY:  [facility] located at [facility], on [facility]  Let patient know to contact pharmacy at the end of the day to make sure medication is ready.  Please notify patient to allow 48-72 hours to process

## 2022-07-24 ENCOUNTER — Other Ambulatory Visit: Payer: Self-pay | Admitting: Family Medicine

## 2022-08-12 ENCOUNTER — Telehealth (INDEPENDENT_AMBULATORY_CARE_PROVIDER_SITE_OTHER): Payer: Medicare Other | Admitting: Nurse Practitioner

## 2022-08-12 ENCOUNTER — Encounter: Payer: Self-pay | Admitting: Nurse Practitioner

## 2022-08-12 VITALS — Ht 67.0 in | Wt 163.0 lb

## 2022-08-12 DIAGNOSIS — U071 COVID-19: Secondary | ICD-10-CM

## 2022-08-12 MED ORDER — MOLNUPIRAVIR EUA 200MG CAPSULE: 4.0000 | ORAL_CAPSULE | Freq: Two times a day (BID) | ORAL | 0 refills | Status: DC

## 2022-08-12 NOTE — Progress Notes (Signed)
   Established Patient Office Visit  An audio/visual tele-health visit was completed today for this patient. I connected with  Tyler [name] on 08/12/22 utilizing audio/visual technology and verified that I am speaking with the correct person using two identifiers. The patient was located at their home, and I was located at the office of [place] at [facility] during the encounter. I discussed the limitations of evaluation and management by telemedicine. The patient expressed understanding and agreed to proceed.     Subjective   Patient ID: Tyler [name], male    DOB: [birth year]  Age: 67 y.o. MRN: [id number]  Chief Complaint  Patient presents with   Covid Positive    Since this morning, cough, congestion,  Expel mucus clear in color    Symptom onset 1 day ago.  Experiencing low-grade fever, nasal congestion with clear nasal drainage, and dry cough.  Not experiencing shortness of breath.  Reports having been vaccinated for COVID at least 4 times possibly 5 times per chart review.  Has history of hypertension in chart but patient reports that his blood pressures only been borderline and is usually well-controlled on 2.5 mg of Zebeta daily.     Review of Systems  Constitutional:  Positive for fever.  HENT:  Positive for congestion (clear).   Respiratory:  Positive for cough. Negative for sputum production and shortness of breath.   Cardiovascular:  Negative for chest pain.  Gastrointestinal:  Positive for heartburn. Negative for diarrhea, nausea and vomiting.  Musculoskeletal:  Negative for joint pain and myalgias.  Neurological:  Positive for headaches.      Objective:     Ht '5\' 7"'$  (1.702 m)   Wt 163 lb (73.9 kg)   BMI 25.53 kg/m  BP Readings from Last 3 Encounters:  06/16/22 (!) 148/87  05/26/22 115/79  05/11/22 130/82   Wt Readings from Last 3 Encounters:  08/12/22 163 lb (73.9 kg)  06/16/22 157 lb (71.2 kg)  05/26/22 155 lb (70.3 kg)       Physical Exam Comprehensive physical exam not completed today as office visit was conducted remotely.  Patient appears well over video, no signs of acute respiratory distress, no coughing during visit.  Patient was alert and oriented, and appeared to have appropriate judgment.   No results found for any visits on 08/12/22.    The 10-year ASCVD risk score (Arnett DK, et al., 2019) is: 17.2%    Assessment & Plan:   Problem List Items Addressed This Visit       Other   COVID-19 - Primary    Acute, symptoms currently mild.  Recommend symptom management with rest, hydration, walking in the home a few times a day as tolerated for lung expansion and promote circulation.  Discussed current CDC isolation and quarantine guidelines.  Due to patient's age, I do feel he would be a candidate for antiviral therapy.  Because Paxlovid interacts with multiple medications that he is currently taking we have opted to prescribe molnupiravir.  Patient told if he decides he would like to take this medication he needs to take it within 5 days of symptom onset.  Patient reports understanding.      Relevant Medications   molnupiravir EUA (LAGEVRIO) 200 mg CAPS capsule    Return if symptoms worsen or fail to improve.    Ailene Ards, NP

## 2022-08-12 NOTE — Assessment & Plan Note (Signed)
Acute, symptoms currently mild.  Recommend symptom management with rest, hydration, walking in the home a few times a day as tolerated for lung expansion and promote circulation.  Discussed current CDC isolation and quarantine guidelines.  Due to patient's age, I do feel he would be a candidate for antiviral therapy.  Because Paxlovid interacts with multiple medications that he is currently taking we have opted to prescribe molnupiravir.  Patient told if he decides he would like to take this medication he needs to take it within 5 days of symptom onset.  Patient reports understanding.

## 2022-08-13 ENCOUNTER — Other Ambulatory Visit: Payer: Self-pay | Admitting: Internal Medicine

## 2022-08-13 DIAGNOSIS — U071 COVID-19: Secondary | ICD-10-CM

## 2022-08-13 MED ORDER — NIRMATRELVIR/RITONAVIR (PAXLOVID)TABLET: 3.0000 | ORAL_TABLET | Freq: Two times a day (BID) | ORAL | 0 refills | Status: AC

## 2022-08-13 NOTE — Progress Notes (Signed)
Wasn't able to get molnupiravir Due to price, so he called the on-call, I sent it in for him

## 2022-08-15 MED ORDER — MOLNUPIRAVIR EUA 200MG CAPSULE: 4.0000 | ORAL_CAPSULE | Freq: Two times a day (BID) | ORAL | 0 refills | Status: AC

## 2022-12-12 ENCOUNTER — Other Ambulatory Visit: Payer: Self-pay | Admitting: Family Medicine

## 2022-12-29 ENCOUNTER — Ambulatory Visit (INDEPENDENT_AMBULATORY_CARE_PROVIDER_SITE_OTHER): Payer: Medicare Other

## 2022-12-29 VITALS — Wt 153.0 lb

## 2022-12-29 DIAGNOSIS — Z0000 Encounter for general adult medical examination without abnormal findings: Secondary | ICD-10-CM | POA: Diagnosis not present

## 2022-12-29 NOTE — Progress Notes (Signed)
I connected with  Tyler [name] on 12/29/22 by a audio enabled telemedicine application and verified that I am speaking with the correct person using two identifiers.  Patient Location: Home  Provider Location: Office/Clinic  I discussed the limitations of evaluation and management by telemedicine. The patient expressed understanding and agreed to proceed.   Subjective:   Tyler [name] is a 67 y.o. male who presents for Medicare Annual/Subsequent preventive examination.  Review of Systems     Cardiac Risk Factors include: advanced age (>39men, >18 women);male gender;dyslipidemia;hypertension     Objective:    Today's Vitals   12/29/22 1143  Weight: 153 lb (69.4 kg)   Body mass index is 23.96 kg/m.     12/29/2022   11:48 AM 12/23/2021   11:58 AM 09/23/2021    4:00 PM 09/07/2021    9:12 AM 09/12/2020    3:44 PM 05/12/2016    8:03 AM 10/21/2014    3:22 PM  Advanced Directives  Does Patient Have a Medical Advance Directive? No Yes Yes No No No No  Type of Advance Directive  Healthcare Power of Attorney Living will      Does patient want to make changes to medical advance directive?   No - Patient declined      Copy of Healthcare Power of Attorney in Chart?  No - copy requested       Would patient like information on creating a medical advance directive? No - Patient declined      No - patient declined information    Current Medications (verified) Outpatient Encounter Medications as of 12/29/2022  Medication Sig   ALPRAZolam (XANAX) 0.25 MG tablet Take 1 tablet (0.25 mg total) by mouth 2 (two) times daily as needed for anxiety (do not drive for 6-8 hours after taking).   bisoprolol (ZEBETA) 5 MG tablet TAKE 1/2 TABLET(2.5 MG) BY MOUTH DAILY   busPIRone (BUSPAR) 5 MG tablet TAKE 1 TABLET BY MOUTH TWICE A DAY   chlorpheniramine-HYDROcodone (TUSSIONEX) 10-8 MG/5ML Take 5 mLs by mouth every 12 (twelve) hours as needed for cough.   docusate sodium (COLACE) 100 MG capsule Take  300 mg by mouth at bedtime.   FIBER PO Take 3 tablets by mouth daily.   mometasone (ELOCON) 0.1 % cream Apply topically.   omeprazole (PRILOSEC) 40 MG capsule Take 1 capsule (40 mg total) by mouth daily. (Patient taking differently: Take 60 mg by mouth daily.)   Pramoxine-HC (HYDROCORTISONE ACE-PRAMOXINE) 2.5-1 % CREA Apply 1 application topically 2 (two) times daily as needed.   rosuvastatin (CRESTOR) 20 MG tablet TAKE 1 TABLET(20 MG) BY MOUTH 1 TIME A WEEK   triamcinolone cream (KENALOG) 0.1 % SMARTSIG:sparingly Topical Daily   No facility-administered encounter medications on file as of 12/29/2022.    Allergies (verified) Penicillins   History: Past Medical History:  Diagnosis Date   Allergy    Anxiety    Asthma    Cataract    beginning   Colon polyps    Depression    Family history of adverse reaction to anesthesia    GERD (gastroesophageal reflux disease)    Hematuria 10/05/2013   Hemorrhoids    Hyperlipidemia    Hypertension    IBS (irritable bowel syndrome)    alt diarrhea/ constipation   Rosacea    Past Surgical History:  Procedure Laterality Date   cataract surgery      COLONOSCOPY  07/28/2006   LYMPH NODE DISSECTION Bilateral 09/23/2021   Procedure: PELVIC LYMPH NODE  DISSECTION;  Surgeon: Crist Fat, MD;  Location: [facility];  Service: Urology;  Laterality: Bilateral;   ROBOT ASSISTED LAPAROSCOPIC RADICAL PROSTATECTOMY N/A 09/23/2021   Procedure: XI ROBOTIC ASSISTED LAPAROSCOPIC RADICAL PROSTATECTOMY;  Surgeon: Crist Fat, MD;  Location: [facility];  Service: Urology;  Laterality: N/A;  4.5 HRS   Family History  Problem Relation Age of Onset   Glaucoma Mother        and father   Coronary artery disease Mother        late 13s-cabg. died at 3   Breast cancer Mother        dx. 60s   Coronary artery disease Father 44       death 17, presumed due to MI as sudden   Stomach cancer Maternal Uncle    Breast cancer Paternal Aunt    Prostate cancer  Paternal Grandfather        dx. 80s   Colon cancer Neg Hx    Colon polyps Neg Hx    Esophageal cancer Neg Hx    Rectal cancer Neg Hx    Social History   Socioeconomic History   Marital status: Married    Spouse name: Not on file   Number of children: Not on file   Years of education: Not on file   Highest education level: Not on file  Occupational History   Not on file  Tobacco Use   Smoking status: Former    Packs/day: 0.75    Years: 4.00    Additional pack years: 0.00    Total pack years: 3.00    Types: Cigarettes    Quit date: 08/01/1973    Years since quitting: 49.4   Smokeless tobacco: Never  Vaping Use   Vaping Use: Never used  Substance and Sexual Activity   Alcohol use: Yes    Alcohol/week: 0.0 - 3.0 standard drinks of alcohol    Comment: social   Drug use: No   Sexual activity: Not on file  Other Topics Concern   Not on file  Social History Narrative   Married (wife plans to transition to me 2020, see mother in law as well [employer]), 3 kids. 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 1 of his children is adopted.       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       Hobbies: biking, cycling, reading, movies   Social Determinants of Health   Financial Resource Strain: Low Risk  (12/29/2022)   Overall Financial Resource Strain (CARDIA)    Difficulty of Paying Living Expenses: Not hard at all  Food Insecurity: No Food Insecurity (12/29/2022)   Hunger Vital Sign    Worried About Running Out of Food in the Last Year: Never true    Ran Out of Food in the Last Year: Never true  Transportation Needs: No Transportation Needs (12/29/2022)   PRAPARE - Administrator, Civil Service (Medical): No    Lack of Transportation (Non-Medical): No  Physical Activity: Insufficiently Active (12/29/2022)   Exercise Vital Sign    Days of Exercise per  Week: 4 days    Minutes of Exercise per Session: 30 min  Stress: No Stress Concern Present (12/29/2022)   [employer] of Occupational Health - Occupational Stress Questionnaire    Feeling of Stress : Not at all  Social Connections: Socially Integrated (12/29/2022)   Social  Connection and Isolation Panel [NHANES]    Frequency of Communication with Friends and Family: More than three times a week    Frequency of Social Gatherings with Friends and Family: More than three times a week    Attends Religious Services: 1 to 4 times per year    Active Member of [employer] or Organizations: Yes    Attends Banker Meetings: 1 to 4 times per year    Marital Status: Married    Tobacco Counseling Counseling given: Not Answered   Clinical Intake:  Pre-visit preparation completed: Yes  Pain : No/denies pain     BMI - recorded: 23.96 Nutritional Status: BMI of 19-24  Normal Nutritional Risks: None Diabetes: No  How often do you need to have someone help you when you read instructions, pamphlets, or other written materials from your doctor or pharmacy?: 1 - Never  Diabetic?no  Interpreter Needed?: No  Information entered by :: Lanier Ensign, LPN   Activities of Daily Living    12/29/2022   11:49 AM  In your present state of health, do you have any difficulty performing the following activities:  Hearing? 0  Vision? 0  Difficulty concentrating or making decisions? 0  Walking or climbing stairs? 0  Dressing or bathing? 0  Doing errands, shopping? 0  Preparing Food and eating ? N  Using the Toilet? N  In the past six months, have you accidently leaked urine? Y  Comment wears a pad  Do you have problems with loss of bowel control? N  Managing your Medications? N  Managing your Finances? N  Housekeeping or managing your Housekeeping? N    Patient Care Team: Shelva Majestic, MD as PCP - General (Family Medicine) Crist Fat, MD as Attending Physician  (Urology)  Indicate any recent Medical Services you may have received from other than [facility] providers in the past year (date may be approximate).     Assessment:   This is a routine wellness examination for [place].  Hearing/Vision screen Hearing Screening - Comments:: Pt denies any hearing issues  Vision Screening - Comments:: Pt follows up with dr Jorja Loa Sharlot Gowda for annual ey exams   Dietary issues and exercise activities discussed: Current Exercise Habits: Home exercise routine, Type of exercise: walking, Time (Minutes): 30, Frequency (Times/Week): 4, Weekly Exercise (Minutes/Week): 120   Goals Addressed             This Visit's Progress    Patient Stated       Eat healthier        Depression Screen    12/29/2022   11:47 AM 08/12/2022    4:56 PM 12/23/2021   11:57 AM 03/29/2021    3:30 PM 09/15/2020    8:34 AM 03/26/2019    9:09 AM 08/18/2017    8:04 AM  PHQ 2/9 Scores  PHQ - 2 Score 0 0 0 1 0 0 0  PHQ- 9 Score  0  5 0      Fall Risk    12/29/2022   11:49 AM 08/12/2022    4:55 PM 06/16/2022    3:46 PM 12/23/2021   12:00 PM 03/29/2021   12:58 PM  Fall Risk   Falls in the past year? 0 0 0 0 0  Number falls in past yr: 0 0 0 0 0  Injury with Fall? 0 0 0 0 0  Risk for fall due to : Impaired vision No Fall Risks No Fall Risks Impaired vision No Fall  Risks  Follow up Falls prevention discussed Falls evaluation completed Falls evaluation completed Falls prevention discussed     FALL RISK PREVENTION PERTAINING TO THE HOME:  Any stairs in or around the home? No  If so, are there any without handrails? No  Home free of loose throw rugs in walkways, pet beds, electrical cords, etc? Yes  Adequate lighting in your home to reduce risk of falls? Yes   ASSISTIVE DEVICES UTILIZED TO PREVENT FALLS:  Life alert? No  Use of a cane, walker or w/c? No  Grab bars in the bathroom? Yes  Shower chair or bench in shower? No  Elevated toilet seat or a handicapped toilet? Yes   TIMED UP  AND GO:  Was the test performed? No .  Cognitive Function:        12/29/2022   11:50 AM 12/23/2021   12:01 PM  6CIT Screen  What Year? 0 points 0 points  What month? 0 points 0 points  What time? 0 points 0 points  Count back from 20 0 points 0 points  Months in reverse 0 points 0 points  Repeat phrase 0 points 0 points  Total Score 0 points 0 points    Immunizations Immunization History  Administered Date(s) Administered   Fluad Quad(high Dose 65+) 06/01/2022   Influenza, High Dose Seasonal PF 08/04/2020   Influenza,inj,Quad PF,6+ Mos 04/22/2013, 08/18/2017, 04/27/2019   PFIZER(Purple Top)SARS-COV-2 Vaccination 08/22/2019, 09/11/2019, 05/14/2020, 02/19/2021   Pfizer Covid-19 Vaccine Bivalent Booster 16yrs & up 07/17/2021   Pneumococcal Polysaccharide-23 09/15/2020   Td 10/26/2007   Tdap 12/18/2017    TDAP status: Up to date  Flu Vaccine status: Up to date  Pneumococcal vaccine status: Due, Education has been provided regarding the importance of this vaccine. Advised may receive this vaccine at local pharmacy or Health Dept. Aware to provide a copy of the vaccination record if obtained from local pharmacy or Health Dept. Verbalized acceptance and understanding.  Covid-19 vaccine status: Completed vaccines  Qualifies for Shingles Vaccine? Yes   Zostavax completed No   Shingrix Completed?: No.    Education has been provided regarding the importance of this vaccine. Patient has been advised to call insurance company to determine out of pocket expense if they have not yet received this vaccine. Advised may also receive vaccine at local pharmacy or Health Dept. Verbalized acceptance and understanding.  Screening Tests Health Maintenance  Topic Date Due   Zoster Vaccines- Shingrix (1 of 2) Never done   COVID-19 Vaccine (6 - 2023-24 season) 04/01/2022   Pneumonia Vaccine 24+ Years old (2 of 2 - PCV) 06/17/2023 (Originally 09/15/2021)   INFLUENZA VACCINE  03/02/2023    Medicare Annual Wellness (AWV)  12/29/2023   Colonoscopy  05/27/2026   DTaP/Tdap/Td (3 - Td or Tdap) 12/19/2027   Hepatitis C Screening  Completed   HPV VACCINES  Aged Out    Health Maintenance  Health Maintenance Due  Topic Date Due   Zoster Vaccines- Shingrix (1 of 2) Never done   COVID-19 Vaccine (6 - 2023-24 season) 04/01/2022    Colorectal cancer screening: Type of screening: Colonoscopy. Completed 05/27/16. Repeat every 10 years  Additional Screening:  Hepatitis C Screening:  Completed 03/03/16  Vision Screening: Recommended annual ophthalmology exams for early detection of glaucoma and other disorders of the eye. Is the patient up to date with their annual eye exam?  Yes  Who is the provider or what is the name of the office in which the patient attends annual  eye exams? Dr Sharlot Gowda If pt is not established with a provider, would they like to be referred to a provider to establish care? No .   Dental Screening: Recommended annual dental exams for proper oral hygiene  Community Resource Referral / Chronic Care Management: CRR required this visit?  No   CCM required this visit?  No      Plan:     I have personally reviewed and noted the following in the patient's chart:   Medical and social history Use of alcohol, tobacco or illicit drugs  Current medications and supplements including opioid prescriptions. Patient is not currently taking opioid prescriptions. Functional ability and status Nutritional status Physical activity Advanced directives List of other physicians Hospitalizations, surgeries, and ER visits in previous 12 months Vitals Screenings to include cognitive, depression, and falls Referrals and appointments  In addition, I have reviewed and discussed with patient certain preventive protocols, quality metrics, and best practice recommendations. A written personalized care plan for preventive services as well as general preventive health recommendations  were provided to patient.     Marzella Schlein, LPN   11/07/8117   Nurse Notes: none

## 2022-12-29 NOTE — Patient Instructions (Signed)
Tyler [name] , Thank you for taking time to come for your Medicare Wellness Visit. I appreciate your ongoing commitment to your health goals. Please review the following plan we discussed and let me know if I can assist you in the future.   These are the goals we discussed:  Goals      Patient Stated     None at this time      Patient Stated     Eat healthier         This is a list of the screening recommended for you and due dates:  Health Maintenance  Topic Date Due   Zoster (Shingles) Vaccine (1 of 2) Never done   COVID-19 Vaccine (6 - 2023-24 season) 04/01/2022   Pneumonia Vaccine (2 of 2 - PCV) 06/17/2023*   Flu Shot  03/02/2023   Medicare Annual Wellness Visit  12/29/2023   Colon Cancer Screening  05/27/2026   DTaP/Tdap/Td vaccine (3 - Td or Tdap) 12/19/2027   Hepatitis C Screening  Completed   HPV Vaccine  Aged Out  *Topic was postponed. The date shown is not the original due date.    Advanced directives: Advance directive discussed with you today. I have provided a copy for you to complete at home and have notarized. Once this is complete please bring a copy in to our office so we can scan it into your chart.  Conditions/risks identified: eat healthier   Next appointment: Follow up in one year for your annual wellness visit.   Preventive Care 65 Years and Older, Male  Preventive care refers to lifestyle choices and visits with your health care provider that can promote health and wellness. What does preventive care include? A yearly physical exam. This is also called an annual well check. Dental exams once or twice a year. Routine eye exams. Ask your health care provider how often you should have your eyes checked. Personal lifestyle choices, including: Daily care of your teeth and gums. Regular physical activity. Eating a healthy diet. Avoiding tobacco and drug use. Limiting alcohol use. Practicing safe sex. Taking low doses of aspirin every day. Taking  vitamin and mineral supplements as recommended by your health care provider. What happens during an annual well check? The services and screenings done by your health care provider during your annual well check will depend on your age, overall health, lifestyle risk factors, and family history of disease. Counseling  Your health care provider may ask you questions about your: Alcohol use. Tobacco use. Drug use. Emotional well-being. Home and relationship well-being. Sexual activity. Eating habits. History of falls. Memory and ability to understand (cognition). Work and work Astronomer. Screening  You may have the following tests or measurements: Height, weight, and BMI. Blood pressure. Lipid and cholesterol levels. These may be checked every 5 years, or more frequently if you are over 9 years old. Skin check. Lung cancer screening. You may have this screening every year starting at age 29 if you have a 30-pack-year history of smoking and currently smoke or have quit within the past 15 years. Fecal occult blood test (FOBT) of the stool. You may have this test every year starting at age 17. Flexible sigmoidoscopy or colonoscopy. You may have a sigmoidoscopy every 5 years or a colonoscopy every 10 years starting at age 30. Prostate cancer screening. Recommendations will vary depending on your family history and other risks. Hepatitis C blood test. Hepatitis B blood test. Sexually transmitted disease (STD) testing. Diabetes screening. This is  done by checking your blood sugar (glucose) after you have not eaten for a while (fasting). You may have this done every 1-3 years. Abdominal aortic aneurysm (AAA) screening. You may need this if you are a current or former smoker. Osteoporosis. You may be screened starting at age 75 if you are at high risk. Talk with your health care provider about your test results, treatment options, and if necessary, the need for more tests. Vaccines  Your  health care provider may recommend certain vaccines, such as: Influenza vaccine. This is recommended every year. Tetanus, diphtheria, and acellular pertussis (Tdap, Td) vaccine. You may need a Td booster every 10 years. Zoster vaccine. You may need this after age 6. Pneumococcal 13-valent conjugate (PCV13) vaccine. One dose is recommended after age 32. Pneumococcal polysaccharide (PPSV23) vaccine. One dose is recommended after age 39. Talk to your health care provider about which screenings and vaccines you need and how often you need them. This information is not intended to replace advice given to you by your health care provider. Make sure you discuss any questions you have with your health care provider. Document Released: 08/14/2015 Document Revised: 04/06/2016 Document Reviewed: 05/19/2015 Elsevier Interactive Patient Education  2017 [employer].  Fall Prevention in the Home Falls can cause injuries. They can happen to people of all ages. There are many things you can do to make your home safe and to help prevent falls. What can I do on the outside of my home? Regularly fix the edges of walkways and driveways and fix any cracks. Remove anything that might make you trip as you walk through a door, such as a raised step or threshold. Trim any bushes or trees on the path to your home. Use bright outdoor lighting. Clear any walking paths of anything that might make someone trip, such as rocks or tools. Regularly check to see if handrails are loose or broken. Make sure that both sides of any steps have handrails. Any raised decks and porches should have guardrails on the edges. Have any leaves, snow, or ice cleared regularly. Use sand or salt on walking paths during winter. Clean up any spills in your garage right away. This includes oil or grease spills. What can I do in the bathroom? Use night lights. Install grab bars by the toilet and in the tub and shower. Do not use towel bars as  grab bars. Use non-skid mats or decals in the tub or shower. If you need to sit down in the shower, use a plastic, non-slip stool. Keep the floor dry. Clean up any water that spills on the floor as soon as it happens. Remove soap buildup in the tub or shower regularly. Attach bath mats securely with double-sided non-slip rug tape. Do not have throw rugs and other things on the floor that can make you trip. What can I do in the bedroom? Use night lights. Make sure that you have a light by your bed that is easy to reach. Do not use any sheets or blankets that are too big for your bed. They should not hang down onto the floor. Have a firm chair that has side arms. You can use this for support while you get dressed. Do not have throw rugs and other things on the floor that can make you trip. What can I do in the kitchen? Clean up any spills right away. Avoid walking on wet floors. Keep items that you use a lot in easy-to-reach places. If you need  to reach something above you, use a strong step stool that has a grab bar. Keep electrical cords out of the way. Do not use floor polish or wax that makes floors slippery. If you must use wax, use non-skid floor wax. Do not have throw rugs and other things on the floor that can make you trip. What can I do with my stairs? Do not leave any items on the stairs. Make sure that there are handrails on both sides of the stairs and use them. Fix handrails that are broken or loose. Make sure that handrails are as long as the stairways. Check any carpeting to make sure that it is firmly attached to the stairs. Fix any carpet that is loose or worn. Avoid having throw rugs at the top or bottom of the stairs. If you do have throw rugs, attach them to the floor with carpet tape. Make sure that you have a light switch at the top of the stairs and the bottom of the stairs. If you do not have them, ask someone to add them for you. What else can I do to help prevent  falls? Wear shoes that: Do not have high heels. Have rubber bottoms. Are comfortable and fit you well. Are closed at the toe. Do not wear sandals. If you use a stepladder: Make sure that it is fully opened. Do not climb a closed stepladder. Make sure that both sides of the stepladder are locked into place. Ask someone to hold it for you, if possible. Clearly mark and make sure that you can see: Any grab bars or handrails. First and last steps. Where the edge of each step is. Use tools that help you move around (mobility aids) if they are needed. These include: Canes. Walkers. Scooters. Crutches. Turn on the lights when you go into a dark area. Replace any light bulbs as soon as they burn out. Set up your furniture so you have a clear path. Avoid moving your furniture around. If any of your floors are uneven, fix them. If there are any pets around you, be aware of where they are. Review your medicines with your doctor. Some medicines can make you feel dizzy. This can increase your chance of falling. Ask your doctor what other things that you can do to help prevent falls. This information is not intended to replace advice given to you by your health care provider. Make sure you discuss any questions you have with your health care provider. Document Released: 05/14/2009 Document Revised: 12/24/2015 Document Reviewed: 08/22/2014 Elsevier Interactive Patient Education  2017 [employer].

## 2023-01-12 ENCOUNTER — Ambulatory Visit (INDEPENDENT_AMBULATORY_CARE_PROVIDER_SITE_OTHER): Payer: Medicare Other | Admitting: Family Medicine

## 2023-01-12 ENCOUNTER — Encounter: Payer: Self-pay | Admitting: Family Medicine

## 2023-01-12 VITALS — BP 124/84 | HR 67 | Temp 98.0°F | Ht 67.0 in | Wt 158.4 lb

## 2023-01-12 DIAGNOSIS — N401 Enlarged prostate with lower urinary tract symptoms: Secondary | ICD-10-CM | POA: Diagnosis not present

## 2023-01-12 DIAGNOSIS — C61 Malignant neoplasm of prostate: Secondary | ICD-10-CM

## 2023-01-12 DIAGNOSIS — R739 Hyperglycemia, unspecified: Secondary | ICD-10-CM | POA: Diagnosis not present

## 2023-01-12 DIAGNOSIS — Z131 Encounter for screening for diabetes mellitus: Secondary | ICD-10-CM | POA: Diagnosis not present

## 2023-01-12 DIAGNOSIS — Z79899 Other long term (current) drug therapy: Secondary | ICD-10-CM

## 2023-01-12 DIAGNOSIS — E785 Hyperlipidemia, unspecified: Secondary | ICD-10-CM | POA: Diagnosis not present

## 2023-01-12 DIAGNOSIS — I10 Essential (primary) hypertension: Secondary | ICD-10-CM | POA: Diagnosis not present

## 2023-01-12 LAB — LIPID PANEL
Cholesterol: 150 mg/dL (ref 0–200)
HDL: 44.4 mg/dL (ref >39.00)
LDL Cholesterol: 87 mg/dL (ref 0–99)
NonHDL: 106.09
Total CHOL/HDL Ratio: 3
Triglycerides: 94 mg/dL (ref 0.0–149.0)
VLDL: 18.8 mg/dL (ref 0.0–40.0)

## 2023-01-12 LAB — COMPREHENSIVE METABOLIC PANEL
ALT: 11 U/L (ref 0–53)
AST: 14 U/L (ref 0–37)
Albumin: 4.5 g/dL (ref 3.5–5.2)
Alkaline Phosphatase: 46 U/L (ref 39–117)
BUN: 22 mg/dL (ref 6–23)
CO2: 26 mEq/L (ref 19–32)
Calcium: 9.5 mg/dL (ref 8.4–10.5)
Chloride: 106 mEq/L (ref 96–112)
Creatinine, Ser: 1 mg/dL (ref 0.40–1.50)
GFR: 77.6 mL/min (ref >60.00)
Glucose, Bld: 93 mg/dL (ref 70–99)
Potassium: 4.3 mEq/L (ref 3.5–5.1)
Sodium: 138 mEq/L (ref 135–145)
Total Bilirubin: 0.6 mg/dL (ref 0.2–1.2)
Total Protein: 6.9 g/dL (ref 6.0–8.3)

## 2023-01-12 LAB — CBC WITH DIFFERENTIAL/PLATELET
Basophils Absolute: 0 10*3/uL (ref 0.0–0.1)
Basophils Relative: 0.7 % (ref 0.0–3.0)
Eosinophils Absolute: 0.1 10*3/uL (ref 0.0–0.7)
Eosinophils Relative: 1 % (ref 0.0–5.0)
HCT: 48 % (ref 39.0–52.0)
Hemoglobin: 15.9 g/dL (ref 13.0–17.0)
Lymphocytes Relative: 25.2 % (ref 12.0–46.0)
Lymphs Abs: 1.7 10*3/uL (ref 0.7–4.0)
MCHC: 33.1 g/dL (ref 30.0–36.0)
MCV: 87.4 fl (ref 78.0–100.0)
Monocytes Absolute: 0.6 10*3/uL (ref 0.1–1.0)
Monocytes Relative: 8.8 % (ref 3.0–12.0)
Neutro Abs: 4.4 10*3/uL (ref 1.4–7.7)
Neutrophils Relative %: 64.3 % (ref 43.0–77.0)
Platelets: 285 10*3/uL (ref 150.0–400.0)
RBC: 5.49 Mil/uL (ref 4.22–5.81)
RDW: 15.4 % (ref 11.5–15.5)
WBC: 6.8 10*3/uL (ref 4.0–10.5)

## 2023-01-12 LAB — VITAMIN B12: Vitamin B-12: 220 pg/mL (ref 211–911)

## 2023-01-12 LAB — HEMOGLOBIN A1C: Hgb A1c MFr Bld: 5.3 % (ref 4.6–6.5)

## 2023-01-12 MED ORDER — ALPRAZOLAM 0.25 MG PO TABS: 0.2500 mg | ORAL_TABLET | Freq: Two times a day (BID) | ORAL | 2 refills | Status: DC | PRN

## 2023-01-12 MED ORDER — HYDROCORTISONE ACE-PRAMOXINE 2.5-1 % EX CREA: 1.0000 | TOPICAL_CREAM | Freq: Two times a day (BID) | CUTANEOUS | 2 refills | Status: AC | PRN

## 2023-01-12 NOTE — Patient Instructions (Addendum)
Let us know when you get your [facility] vaccine at the pharmacy and if you get any COVID vaccines this fall.  Please stop by lab before you go If you have mychart- we will send your results within 3 business days of [place] receiving them.  If you do not have mychart- we will call you about results within 5 business days of [place] receiving them.  *please also note that you will see labs on mychart as soon as they post. I will later go in and write notes on them- will say "notes from Dr. Durene Cal"   Recommended follow up: Return in about 1 year (around 01/12/2024) for followup or sooner if needed.Schedule b4 you leave.

## 2023-01-12 NOTE — Progress Notes (Signed)
Phone [phone] In person visit   Subjective:   Tyler [name] is a 68 y.o. year old very pleasant male patient who presents for/with See problem oriented charting Chief Complaint  Patient presents with   Annual Exam   Hypertension   Gastroesophageal Reflux    Fasting. (No issues/declines ROS)   Past Medical History-  Patient Active Problem List   Diagnosis Date Noted   Prostate cancer (HCC) 08/13/2021    Priority: High   Palpitations 11/22/2017    Priority: Medium    Memory difficulties 11/22/2017    Priority: Medium    Essential hypertension 11/21/2014    Priority: Medium    BPH (benign prostatic hyperplasia) 11/21/2014    Priority: Medium    Irritable bowel syndrome 04/17/2008    Priority: Medium    Anxiety state 12/27/2007    Priority: Medium    Hyperlipidemia 10/26/2007    Priority: Medium    Allergic rhinitis 11/21/2014    Priority: Low   Former smoker 11/21/2014    Priority: Low   Plantar fasciitis of left foot 10/22/2014    Priority: Low   Metatarsalgia of both feet 09/26/2014    Priority: Low   Hematuria 10/05/2013    Priority: Low   External hemorrhoids 04/17/2008    Priority: Low   History of colonic polyps 04/16/2008    Priority: Low   Asthma 12/27/2007    Priority: Low   GERD 12/27/2007    Priority: Low   Rosacea 10/26/2007    Priority: Low   COVID-19 08/12/2022   Genetic testing 08/23/2021   Family history of breast cancer 08/13/2021   Family history of prostate cancer 08/13/2021    Medications- reviewed and updated Current Outpatient Medications  Medication Sig Dispense Refill   bisoprolol (ZEBETA) 5 MG tablet TAKE 1/2 TABLET(2.5 MG) BY MOUTH DAILY 45 tablet 3   busPIRone (BUSPAR) 5 MG tablet TAKE 1 TABLET BY MOUTH TWICE A DAY 60 tablet 5   docusate sodium (COLACE) 100 MG capsule Take 300 mg by mouth at bedtime.     FIBER PO Take 3 tablets by mouth daily.     mometasone (ELOCON) 0.1 % cream Apply topically.     omeprazole  (PRILOSEC) 40 MG capsule Take 1 capsule (40 mg total) by mouth daily. (Patient taking differently: Take 60 mg by mouth daily.) 30 capsule 11   rosuvastatin (CRESTOR) 20 MG tablet TAKE 1 TABLET(20 MG) BY MOUTH 1 TIME A WEEK 14 tablet 3   triamcinolone cream (KENALOG) 0.1 % SMARTSIG:sparingly Topical Daily     ALPRAZolam (XANAX) 0.25 MG tablet Take 1 tablet (0.25 mg total) by mouth 2 (two) times daily as needed for anxiety (do not drive for 6-8 hours after taking). 30 tablet 2   Pramoxine-HC (HYDROCORTISONE ACE-PRAMOXINE) 2.5-1 % CREA Apply 1 Application topically 2 (two) times daily as needed. 30 g 2   No current facility-administered medications for this visit.     Objective:  BP 124/84 Comment: most recent home reading  Pulse 67   Temp 98 F (36.7 C)   Ht 5\' 7"  (1.702 m)   Wt 158 lb 6.4 oz (71.8 kg)   SpO2 97%   BMI 24.81 kg/m  Gen: NAD, resting comfortably CV: RRR no murmurs rubs or gallops Lungs: CTAB no crackles, wheeze, rhonchi Abdomen: soft/nontender/nondistended/normal bowel sounds. No rebound or guarding.  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 Marlou Porch S: radical prostatectomy February 2023 after watchful waiting since 2023.  - getting consult with DrLafonda Mosses for incontinence and possible male sling- he is torn at present. He is getting 2nd opinion at [facility]. One thin pad a day. Thicker pad with walking -PSA at undetectable levels in 2024 A/P: doing well status post prostatectomy except for incontinence and weighing his options. Also Emergency Department issues  #hypertension S: medication: bisoprolol 2.5 mg  Home readings #s: 128/84 yesterday BP Readings from Last 3 Encounters:  01/12/23 124/84  06/16/22 (!) 148/87   05/26/22 115/79  A/P: well controlled continue current medications . We will tolerate diastolic just over 80- with at least being under 85   #hyperlipidemia S: Medication: rosuvastatin 20 mg weekly  but exercise down slightly  Lab Results  Component Value Date   CHOL 139 01/11/2022   HDL 51.50 01/11/2022   LDLCALC 63 01/11/2022   LDLDIRECT 152.5 11/18/2008   TRIG 121.0 01/11/2022   CHOLHDL 3 01/11/2022  A/P: hopefully stable- update a1c  today. Continue current meds for now   # Anxiety S:Medication:  buspirone 5 mg twice daily with alprazolam as backup A/P: well controlled continue current medications    # GERD S:Medication:  omeprazole 60 mg. Doing better with this -endoscopy November 2023 with reflux esophagitis and small hiatal hernia A/P: stable- continue current medicines   - does not take B12- we will update  #constipation/hemorrhoids- uses colace and fiber supplement but at times still needs steroid cream for hemorrhoids - needs updated rx  #Health maintenance  -cancer screening- 05/27/16 colonoscopy good for 10 years, quit 1975 smoking so does not need lung cancer screening, sees Dr. Yetta Barre for skin cancer screening -immunizations- had COVID shot in fall but still had COVID in January. Consider shingrix at pharmacy Immunization History  Administered Date(s) Administered   Fluad Quad(high Dose 65+) 06/01/2022   Influenza, High Dose Seasonal PF 08/04/2020   Influenza,inj,Quad PF,6+ Mos 04/22/2013, 08/18/2017, 04/27/2019   PFIZER(Purple Top)SARS-COV-2 Vaccination 08/22/2019, 09/11/2019, 05/14/2020, 02/19/2021   Pfizer Covid-19 Vaccine Bivalent Booster 28yrs & up 07/17/2021   Pneumococcal Polysaccharide-23 09/15/2020   Td 10/26/2007   Tdap 12/18/2017   Recommended follow up: Return in about 1 year (around 01/12/2024) for followup or sooner if needed.Schedule b4 you leave. Future Appointments  Date Time Provider Department Center  01/04/2024 11:45 AM [facility] ANNUAL  WELLNESS VISIT 1 [facility]   Lab/Order associations:   ICD-10-CM   1. Essential hypertension  I10     2. Hyperlipidemia, unspecified hyperlipidemia type  E78.5     3. Benign prostatic hyperplasia with lower urinary tract symptoms, symptom details unspecified  N40.1     4. Prostate cancer (HCC) Chronic C61     5. High risk medication use  Z79.899       Meds ordered this encounter  Medications   ALPRAZolam (XANAX) 0.25 MG tablet    Sig: Take 1 tablet (0.25 mg total) by mouth 2 (two) times daily as needed for anxiety (do not drive for 6-8 hours after taking).    Dispense:  30 tablet    Refill:  2   Pramoxine-HC (HYDROCORTISONE ACE-PRAMOXINE) 2.5-1 % CREA    Sig: Apply 1 Application topically 2 (two) times daily as needed.    Dispense:  30 g    Refill:  2  Return precautions advised.  Garret Reddish, MD

## 2023-01-30 ENCOUNTER — Other Ambulatory Visit: Payer: Self-pay | Admitting: Urology

## 2023-02-07 NOTE — Patient Instructions (Signed)
SURGICAL WAITING ROOM VISITATION  Patients having surgery or a procedure may have no more than 2 support people in the waiting area - these visitors may rotate.    Children under the age of 8 must have an adult with them who is not the patient.  Due to an increase in RSV and influenza rates and associated hospitalizations, children ages 20 and under may not visit patients in [facility] hospitals.  If the patient needs to stay at the hospital during part of their recovery, the visitor guidelines for inpatient rooms apply. Pre-op nurse will coordinate an appropriate time for 1 support person to accompany patient in pre-op.  This support person may not rotate.    Please refer to the [facility] website for the visitor guidelines for Inpatients (after your surgery is over and you are in a regular room).       Your procedure is scheduled on:  02/21/23    Report to [facility] Main Entrance    Report to admitting at  0800AM   Call this number if you have problems the morning of surgery [phone]   Do not eat food  or drink liquids :After Midnight. )                           If you have questions, please contact your surgeon's office.        Oral Hygiene is also important to reduce your risk of infection.                                    Remember - BRUSH YOUR TEETH THE MORNING OF SURGERY WITH YOUR REGULAR TOOTHPASTE  DENTURES WILL BE REMOVED PRIOR TO SURGERY PLEASE DO NOT APPLY "Poly grip" OR ADHESIVES!!!   Do NOT smoke after Midnight   Take these medicines the morning of surgery with A SIP OF WATER:  bisoprolol, buspar, omeprazole   DO NOT TAKE ANY ORAL DIABETIC MEDICATIONS DAY OF YOUR SURGERY  Bring CPAP mask and tubing day of surgery.                              You may not have any metal on your body including hair pins, jewelry, and body piercing             Do not wear make-up, lotions, powders, perfumes/cologne, or deodorant  Do not wear nail  polish including gel and S&S, artificial/acrylic nails, or any other type of covering on natural nails including finger and toenails. If you have artificial nails, gel coating, etc. that needs to be removed by a nail salon please have this removed prior to surgery or surgery may need to be canceled/ delayed if the surgeon/ anesthesia feels like they are unable to be safely monitored.   Do not shave  48 hours prior to surgery.               Men may shave face and neck.   Do not bring valuables to the hospital. [facility] IS NOT             RESPONSIBLE   FOR VALUABLES.   Contacts, glasses, dentures or bridgework may not be worn into surgery.   Bring small overnight bag day of surgery.   DO NOT BRING YOUR HOME MEDICATIONS TO THE HOSPITAL.  PHARMACY WILL DISPENSE MEDICATIONS LISTED ON YOUR MEDICATION LIST TO YOU DURING YOUR ADMISSION IN THE HOSPITAL!    Patients discharged on the day of surgery will not be allowed to drive home.  Someone NEEDS to stay with you for the first 24 hours after anesthesia.   Special Instructions: Bring a copy of your healthcare power of attorney and living will documents the day of surgery if you haven't scanned them before.              Please read over the following fact sheets you were given: IF YOU HAVE QUESTIONS ABOUT YOUR PRE-OP INSTRUCTIONS PLEASE CALL [phone]   If you received a COVID test during your pre-op visit  it is requested that you wear a mask when out in public, stay away from anyone that may not be feeling well and notify your surgeon if you develop symptoms. If you test positive for Covid or have been in contact with anyone that has tested positive in the last 10 days please notify you surgeon.    [facility] - Preparing for Surgery Before surgery, you can play an important role.  Because skin is not sterile, your skin needs to be as free of germs as possible.  You can reduce the number of germs on your skin by washing with CHG (chlorahexidine  gluconate) soap before surgery.  CHG is an antiseptic cleaner which kills germs and bonds with the skin to continue killing germs even after washing. Please DO NOT use if you have an allergy to CHG or antibacterial soaps.  If your skin becomes reddened/irritated stop using the CHG and inform your nurse when you arrive at Short Stay. Do not shave (including legs and underarms) for at least 48 hours prior to the first CHG shower.  You may shave your face/neck. Please follow these instructions carefully:  1.  Shower with CHG Soap the night before surgery and the  morning of Surgery.  2.  If you choose to wash your hair, wash your hair first as usual with your  normal  shampoo.  3.  After you shampoo, rinse your hair and body thoroughly to remove the  shampoo.                           4.  Use CHG as you would any other liquid soap.  You can apply chg directly  to the skin and wash                       Gently with a scrungie or clean washcloth.  5.  Apply the CHG Soap to your body ONLY FROM THE NECK DOWN.   Do not use on face/ open                           Wound or open sores. Avoid contact with eyes, ears mouth and genitals (private parts).                       Wash face,  Genitals (private parts) with your normal soap.             6.  Wash thoroughly, paying special attention to the area where your surgery  will be performed.  7.  Thoroughly rinse your body with warm water from the neck down.  8.  DO NOT shower/wash with your normal soap after  using and rinsing off  the CHG Soap.                9.  Pat yourself dry with a clean towel.            10.  Wear clean pajamas.            11.  Place clean sheets on your bed the night of your first shower and do not  sleep with pets. Day of Surgery : Do not apply any lotions/deodorants the morning of surgery.  Please wear clean clothes to the hospital/surgery center.  FAILURE TO FOLLOW THESE INSTRUCTIONS MAY RESULT IN THE CANCELLATION OF YOUR  SURGERY PATIENT SIGNATURE_________________________________  NURSE SIGNATURE__________________________________  ________________________________________________________________________

## 2023-02-07 NOTE — Progress Notes (Addendum)
Anesthesia Review:  PCP: Tana Conch LOV 01/12/23  Cardiologist : none  Chest x-ray : EKG : 02/10/23  Echo : Stress test: Cardiac Cath :  Activity level: can do a flight of stairs without difficutly  Sleep Study/ CPAP : none  Fasting Blood Sugar :      / Checks Blood Sugar -- times a day:   Blood Thinner/ Instructions /Last Dose: ASA / Instructions/ Last Dose :    Hgba1c-01/12/23- 5.3

## 2023-02-10 ENCOUNTER — Encounter (HOSPITAL_COMMUNITY): Payer: Self-pay

## 2023-02-10 ENCOUNTER — Encounter (HOSPITAL_COMMUNITY)
Admission: RE | Admit: 2023-02-10 | Discharge: 2023-02-10 | Disposition: A | Discharge status: Home / Self Care | Payer: Medicare Other | Source: Ambulatory Visit | Attending: Urology | Admitting: Urology

## 2023-02-10 ENCOUNTER — Other Ambulatory Visit: Payer: Self-pay

## 2023-02-10 VITALS — BP 145/89 | HR 65 | Temp 98.1°F | Resp 16 | Ht 67.0 in | Wt 155.0 lb

## 2023-02-10 DIAGNOSIS — Z01818 Encounter for other preprocedural examination: Secondary | ICD-10-CM | POA: Insufficient documentation

## 2023-02-10 DIAGNOSIS — R32 Unspecified urinary incontinence: Secondary | ICD-10-CM | POA: Diagnosis not present

## 2023-02-10 HISTORY — DX: Other specified postprocedural states: Z98.890

## 2023-02-10 HISTORY — DX: Malignant (primary) neoplasm, unspecified: C80.1

## 2023-02-10 HISTORY — DX: Unspecified osteoarthritis, unspecified site: M19.90

## 2023-02-10 HISTORY — DX: Nausea with vomiting, unspecified: R11.2

## 2023-02-10 LAB — CBC
HCT: 51.3 % (ref 39.0–52.0)
Hemoglobin: 16.9 g/dL (ref 13.0–17.0)
MCH: 29.1 pg (ref 26.0–34.0)
MCHC: 32.9 g/dL (ref 30.0–36.0)
MCV: 88.4 fL (ref 80.0–100.0)
Platelets: 267 10*3/uL (ref 150–400)
RBC: 5.8 MIL/uL (ref 4.22–5.81)
RDW: 14.1 % (ref 11.5–15.5)
WBC: 7.6 10*3/uL (ref 4.0–10.5)
nRBC: 0 % (ref 0.0–0.2)

## 2023-02-10 LAB — BASIC METABOLIC PANEL
Anion gap: 8 (ref 5–15)
BUN: 21 mg/dL (ref 8–23)
CO2: 23 mmol/L (ref 22–32)
Calcium: 9 mg/dL (ref 8.9–10.3)
Chloride: 106 mmol/L (ref 98–111)
Creatinine, Ser: 1.04 mg/dL (ref 0.61–1.24)
GFR, Estimated: >60 mL/min (ref >60)
Glucose, Bld: 102 mg/dL — ABNORMAL HIGH (ref 70–99)
Potassium: 3.9 mmol/L (ref 3.5–5.1)
Sodium: 137 mmol/L (ref 135–145)

## 2023-02-11 LAB — URINE CULTURE: Culture: NO GROWTH

## 2023-02-12 ENCOUNTER — Other Ambulatory Visit: Payer: Self-pay | Admitting: Gastroenterology

## 2023-02-20 MED ORDER — GENTAMICIN SULFATE 40 MG/ML IJ SOLN
5.0000 mg/kg | INTRAVENOUS | Status: AC
  Administered 2023-02-21: 339.2 mg via INTRAVENOUS
  Filled 2023-02-20: qty 8.5

## 2023-02-21 ENCOUNTER — Encounter (HOSPITAL_COMMUNITY)
Admission: RE | Disposition: A | Discharge status: Home / Self Care | Payer: Self-pay | Source: Ambulatory Visit | Attending: Urology

## 2023-02-21 ENCOUNTER — Ambulatory Visit (HOSPITAL_BASED_OUTPATIENT_CLINIC_OR_DEPARTMENT_OTHER): Payer: Medicare Other | Admitting: Anesthesiology

## 2023-02-21 ENCOUNTER — Encounter (HOSPITAL_COMMUNITY): Payer: Self-pay | Admitting: Urology

## 2023-02-21 ENCOUNTER — Other Ambulatory Visit: Payer: Self-pay

## 2023-02-21 ENCOUNTER — Ambulatory Visit (HOSPITAL_COMMUNITY)
Admission: RE | Admit: 2023-02-21 | Discharge: 2023-02-21 | Disposition: A | Discharge status: Home / Self Care | Payer: Medicare Other | Source: Ambulatory Visit | Attending: Urology | Admitting: Urology

## 2023-02-21 ENCOUNTER — Ambulatory Visit (HOSPITAL_COMMUNITY): Payer: Medicare Other | Admitting: Physician Assistant

## 2023-02-21 DIAGNOSIS — I10 Essential (primary) hypertension: Secondary | ICD-10-CM | POA: Insufficient documentation

## 2023-02-21 DIAGNOSIS — Z01818 Encounter for other preprocedural examination: Secondary | ICD-10-CM

## 2023-02-21 DIAGNOSIS — N393 Stress incontinence (female) (male): Secondary | ICD-10-CM

## 2023-02-21 DIAGNOSIS — Z87891 Personal history of nicotine dependence: Secondary | ICD-10-CM | POA: Diagnosis not present

## 2023-02-21 DIAGNOSIS — Z9079 Acquired absence of other genital organ(s): Secondary | ICD-10-CM | POA: Insufficient documentation

## 2023-02-21 DIAGNOSIS — F418 Other specified anxiety disorders: Secondary | ICD-10-CM | POA: Diagnosis not present

## 2023-02-21 DIAGNOSIS — K219 Gastro-esophageal reflux disease without esophagitis: Secondary | ICD-10-CM | POA: Diagnosis not present

## 2023-02-21 DIAGNOSIS — Z8546 Personal history of malignant neoplasm of prostate: Secondary | ICD-10-CM | POA: Insufficient documentation

## 2023-02-21 HISTORY — PX: URETHRAL SLING: SHX2621

## 2023-02-21 SURGERY — CREATION, URETHRAL SLING, MALE
Anesthesia: General

## 2023-02-21 MED ORDER — CHLORHEXIDINE GLUCONATE 0.12 % MT SOLN
15.0000 mL | Freq: Once | OROMUCOSAL | Status: AC
  Administered 2023-02-21: 15 mL via OROMUCOSAL

## 2023-02-21 MED ORDER — BUPIVACAINE HCL (PF) 0.5 % IJ SOLN
INTRAMUSCULAR | Status: DC | PRN
  Administered 2023-02-21: 10 mL

## 2023-02-21 MED ORDER — OXYCODONE HCL 5 MG/5ML PO SOLN: 5.0000 mg | Freq: Once | ORAL | Status: DC | PRN

## 2023-02-21 MED ORDER — ORAL CARE MOUTH RINSE: 15.0000 mL | Freq: Once | OROMUCOSAL | Status: AC

## 2023-02-21 MED ORDER — ONDANSETRON HCL 4 MG/2ML IJ SOLN
INTRAMUSCULAR | Status: DC | PRN
  Administered 2023-02-21: 4 mg via INTRAVENOUS

## 2023-02-21 MED ORDER — CELECOXIB 200 MG PO CAPS: 200.0000 mg | ORAL_CAPSULE | Freq: Two times a day (BID) | ORAL | 1 refills | Status: AC

## 2023-02-21 MED ORDER — STERILE WATER FOR IRRIGATION IR SOLN
Status: DC | PRN
  Administered 2023-02-21: 1000 mL

## 2023-02-21 MED ORDER — SCOPOLAMINE 1 MG/3DAYS TD PT72
1.0000 | MEDICATED_PATCH | TRANSDERMAL | Status: DC
  Administered 2023-02-21: 1.5 mg via TRANSDERMAL

## 2023-02-21 MED ORDER — BUPIVACAINE HCL (PF) 0.5 % IJ SOLN
INTRAMUSCULAR | Status: AC
  Filled 2023-02-21: qty 30

## 2023-02-21 MED ORDER — FENTANYL CITRATE (PF) 100 MCG/2ML IJ SOLN
INTRAMUSCULAR | Status: AC
  Filled 2023-02-21: qty 2

## 2023-02-21 MED ORDER — PROPOFOL 10 MG/ML IV BOLUS
INTRAVENOUS | Status: AC
  Filled 2023-02-21: qty 20

## 2023-02-21 MED ORDER — MIDAZOLAM HCL 2 MG/2ML IJ SOLN
INTRAMUSCULAR | Status: AC
  Filled 2023-02-21: qty 2

## 2023-02-21 MED ORDER — MIDAZOLAM HCL 5 MG/5ML IJ SOLN
INTRAMUSCULAR | Status: DC | PRN
  Administered 2023-02-21: 2 mg via INTRAVENOUS

## 2023-02-21 MED ORDER — OXYCODONE HCL 5 MG PO TABS: 5.0000 mg | ORAL_TABLET | Freq: Once | ORAL | Status: DC | PRN

## 2023-02-21 MED ORDER — FENTANYL CITRATE (PF) 100 MCG/2ML IJ SOLN
INTRAMUSCULAR | Status: DC | PRN
  Administered 2023-02-21 (×3): 25 ug via INTRAVENOUS
  Administered 2023-02-21: 50 ug via INTRAVENOUS
  Administered 2023-02-21: 25 ug via INTRAVENOUS

## 2023-02-21 MED ORDER — LIDOCAINE HCL (PF) 2 % IJ SOLN
INTRAMUSCULAR | Status: AC
  Filled 2023-02-21: qty 5

## 2023-02-21 MED ORDER — HYDROMORPHONE HCL 1 MG/ML IJ SOLN: 0.2500 mg | INTRAMUSCULAR | Status: DC | PRN

## 2023-02-21 MED ORDER — SCOPOLAMINE 1 MG/3DAYS TD PT72
MEDICATED_PATCH | TRANSDERMAL | Status: AC
  Filled 2023-02-21: qty 1

## 2023-02-21 MED ORDER — LIDOCAINE HCL (PF) 1 % IJ SOLN
INTRAMUSCULAR | Status: DC | PRN
  Administered 2023-02-21: 10 mL

## 2023-02-21 MED ORDER — VANCOMYCIN HCL IN DEXTROSE 1-5 GM/200ML-% IV SOLN
1000.0000 mg | INTRAVENOUS | Status: AC
  Administered 2023-02-21: 1000 mg via INTRAVENOUS
  Filled 2023-02-21: qty 200

## 2023-02-21 MED ORDER — 0.9 % SODIUM CHLORIDE (POUR BTL) OPTIME
TOPICAL | Status: DC | PRN
  Administered 2023-02-21: 1000 mL

## 2023-02-21 MED ORDER — CHLORHEXIDINE GLUCONATE 4 % EX SOLN: Freq: Once | CUTANEOUS | Status: DC

## 2023-02-21 MED ORDER — OXYCODONE HCL 5 MG PO TABS: 5.0000 mg | ORAL_TABLET | Freq: Four times a day (QID) | ORAL | 0 refills | Status: DC | PRN

## 2023-02-21 MED ORDER — LIDOCAINE HCL (CARDIAC) PF 100 MG/5ML IV SOSY
PREFILLED_SYRINGE | INTRAVENOUS | Status: DC | PRN
  Administered 2023-02-21: 40 mg via INTRAVENOUS

## 2023-02-21 MED ORDER — PROPOFOL 10 MG/ML IV BOLUS
INTRAVENOUS | Status: DC | PRN
Start: 2023-02-21 — End: 2023-02-21
  Administered 2023-02-21: 200 mg via INTRAVENOUS

## 2023-02-21 MED ORDER — ACETAMINOPHEN 500 MG PO TABS
1000.0000 mg | ORAL_TABLET | Freq: Once | ORAL | Status: AC
  Administered 2023-02-21: 1000 mg via ORAL
  Filled 2023-02-21: qty 2

## 2023-02-21 MED ORDER — LIDOCAINE HCL (PF) 1 % IJ SOLN
INTRAMUSCULAR | Status: AC
  Filled 2023-02-21: qty 30

## 2023-02-21 MED ORDER — ONDANSETRON HCL 4 MG/2ML IJ SOLN
INTRAMUSCULAR | Status: AC
  Filled 2023-02-21: qty 2

## 2023-02-21 MED ORDER — PROMETHAZINE HCL 25 MG/ML IJ SOLN: 6.2500 mg | INTRAMUSCULAR | Status: DC | PRN

## 2023-02-21 MED ORDER — DEXAMETHASONE SODIUM PHOSPHATE 10 MG/ML IJ SOLN
INTRAMUSCULAR | Status: AC
  Filled 2023-02-21: qty 1

## 2023-02-21 MED ORDER — SULFAMETHOXAZOLE-TRIMETHOPRIM 800-160 MG PO TABS: 1.0000 | ORAL_TABLET | Freq: Two times a day (BID) | ORAL | 0 refills | Status: DC

## 2023-02-21 MED ORDER — MIDAZOLAM HCL 2 MG/2ML IJ SOLN: 0.5000 mg | Freq: Once | INTRAMUSCULAR | Status: DC | PRN

## 2023-02-21 MED ORDER — SODIUM CHLORIDE 0.9 % IR SOLN
Status: DC | PRN
  Administered 2023-02-21: 1000 mL

## 2023-02-21 MED ORDER — PROMETHAZINE HCL 12.5 MG PO TABS: 12.5000 mg | ORAL_TABLET | Freq: Four times a day (QID) | ORAL | 0 refills | Status: DC | PRN

## 2023-02-21 MED ORDER — LACTATED RINGERS IV SOLN: INTRAVENOUS | Status: DC

## 2023-02-21 MED ORDER — DEXAMETHASONE SODIUM PHOSPHATE 10 MG/ML IJ SOLN
INTRAMUSCULAR | Status: DC | PRN
  Administered 2023-02-21: 5 mg via INTRAVENOUS

## 2023-02-21 MED ORDER — MEPERIDINE HCL 50 MG/ML IJ SOLN: 6.2500 mg | INTRAMUSCULAR | Status: DC | PRN

## 2023-02-21 SURGICAL SUPPLY — 56 items
ADH SKN CLS APL DERMABOND .7 (GAUZE/BANDAGES/DRESSINGS) ×1
APL PRP STRL LF DISP 70% ISPRP (MISCELLANEOUS) ×2
BAG COUNTER SPONGE SURGICOUNT (BAG) IMPLANT
BAG DRN RND TRDRP ANRFLXCHMBR (UROLOGICAL SUPPLIES) ×1
BAG SPNG CNTER NS LX DISP (BAG)
BAG URINE DRAIN 2000ML AR STRL (UROLOGICAL SUPPLIES) ×1 IMPLANT
BLADE SURG 15 STRL LF DISP TIS (BLADE) ×1 IMPLANT
BLADE SURG 15 STRL SS (BLADE) ×1
BNDG GAUZE DERMACEA FLUFF 4 (GAUZE/BANDAGES/DRESSINGS) ×1 IMPLANT
BNDG GZE DERMACEA 4 6PLY (GAUZE/BANDAGES/DRESSINGS) ×1
BRIEF MESH DISP LRG (UNDERPADS AND DIAPERS) ×1 IMPLANT
CATH FOLEY 2WAY SLVR 5CC 14FR (CATHETERS) ×1 IMPLANT
CATH TIEMANN FOLEY 18FR 5CC (CATHETERS) ×1 IMPLANT
CHLORAPREP W/TINT 26 (MISCELLANEOUS) ×2 IMPLANT
COVER BACK TABLE 60X90IN (DRAPES) ×1 IMPLANT
COVER MAYO STAND STRL (DRAPES) ×1 IMPLANT
COVER SURGICAL LIGHT HANDLE (MISCELLANEOUS) ×1 IMPLANT
DERMABOND ADVANCED .7 DNX12 (GAUZE/BANDAGES/DRESSINGS) ×2 IMPLANT
DRAPE UNDERBUTTOCKS STRL (DISPOSABLE) ×1 IMPLANT
DRSG TELFA 3X8 NADH STRL (GAUZE/BANDAGES/DRESSINGS) ×1 IMPLANT
ELECT PENCIL ROCKER SW 15FT (MISCELLANEOUS) ×1 IMPLANT
GAUZE 4X4 16PLY ~~LOC~~+RFID DBL (SPONGE) ×2 IMPLANT
GLOVE BIOGEL M 7.0 STRL (GLOVE) ×1 IMPLANT
GLOVE BIOGEL PI IND STRL 7.0 (GLOVE) ×1 IMPLANT
GOWN STRL REUS W/ TWL XL LVL3 (GOWN DISPOSABLE) ×1 IMPLANT
GOWN STRL REUS W/TWL XL LVL3 (GOWN DISPOSABLE) ×2
HOLDER FOLEY CATH W/STRAP (MISCELLANEOUS) ×1 IMPLANT
KIT BASIN OR (CUSTOM PROCEDURE TRAY) ×1 IMPLANT
KIT TURNOVER KIT A (KITS) IMPLANT
LUBRICANT JELLY K Y 4OZ (MISCELLANEOUS) ×1 IMPLANT
NDL HYPO 22X1.5 SAFETY MO (MISCELLANEOUS) ×1 IMPLANT
NDL SPNL 22GX3.5 QUINCKE BK (NEEDLE) ×1 IMPLANT
NEEDLE HYPO 22X1.5 SAFETY MO (MISCELLANEOUS) ×1 IMPLANT
NEEDLE SPNL 22GX3.5 QUINCKE BK (NEEDLE) ×1 IMPLANT
PLUG CATH AND CAP STRL 200 (CATHETERS) ×1 IMPLANT
PROTECTOR NERVE ULNAR (MISCELLANEOUS) ×1 IMPLANT
SET IRRIG Y TYPE TUR BLADDER L (SET/KITS/TRAYS/PACK) ×1 IMPLANT
SHEET LAVH (DRAPES) ×1 IMPLANT
SLING ADVANCE MALE SYSTEM (Mesh General) IMPLANT
SPIKE FLUID TRANSFER (MISCELLANEOUS) IMPLANT
STAPLER VISISTAT 35W (STAPLE) ×1 IMPLANT
SUT MNCRL AB 4-0 PS2 18 (SUTURE) ×1 IMPLANT
SUT SILK 2 0 30 PSL (SUTURE) IMPLANT
SUT VIC AB 2-0 SH 27 (SUTURE)
SUT VIC AB 2-0 SH 27X BRD (SUTURE) IMPLANT
SUT VIC AB 3-0 SH 27 (SUTURE) ×5
SUT VIC AB 3-0 SH 27XBRD (SUTURE) ×4 IMPLANT
SUT VIC AB 4-0 PS2 27 (SUTURE) IMPLANT
SUT VIC AB 4-0 RB1 27 (SUTURE)
SUT VIC AB 4-0 RB1 27XBRD (SUTURE) IMPLANT
SYR 10ML LL (SYRINGE) ×1 IMPLANT
SYR 20ML LL LF (SYRINGE) ×1 IMPLANT
SYR BULB IRRIG 60ML STRL (SYRINGE) ×1 IMPLANT
TOWEL OR 17X26 10 PK STRL BLUE (TOWEL DISPOSABLE) ×2 IMPLANT
TUBING CONNECTING 10 (TUBING) ×1 IMPLANT
YANKAUER SUCT BULB TIP 10FT TU (MISCELLANEOUS) ×1 IMPLANT

## 2023-02-21 NOTE — Anesthesia Preprocedure Evaluation (Addendum)
Anesthesia Evaluation  Patient identified by MRN, date of birth, ID band Patient awake    Reviewed: Allergy & Precautions, NPO status , Patient's Chart, lab work & pertinent test results  History of Anesthesia Complications (+) PONV  Airway Mallampati: II  TM Distance: >3 FB Neck ROM: Full    Dental  (+) Dental Advisory Given   Pulmonary former smoker   breath sounds clear to auscultation       Cardiovascular hypertension, Pt. on medications and Pt. on home beta blockers (-) angina  Rhythm:Regular Rate:Normal     Neuro/Psych   Anxiety Depression    negative neurological ROS     GI/Hepatic Neg liver ROS,GERD  Medicated and Controlled,,  Endo/Other  negative endocrine ROS    Renal/GU negative Renal ROS     Musculoskeletal  (+) Arthritis ,    Abdominal   Peds  Hematology negative hematology ROS (+)   Anesthesia Other Findings H/o prostate cancer  Reproductive/Obstetrics                             Anesthesia Physical Anesthesia Plan  ASA: 2  Anesthesia Plan: General   Post-op Pain Management: Tylenol PO (pre-op)*   Induction: Intravenous  PONV Risk Score and Plan: 3 and Ondansetron, Dexamethasone, Treatment may vary due to age or medical condition and Scopolamine patch - Pre-op  Airway Management Planned: LMA  Additional Equipment: None  Intra-op Plan:   Post-operative Plan:   Informed Consent: I have reviewed the patients History and Physical, chart, labs and discussed the procedure including the risks, benefits and alternatives for the proposed anesthesia with the patient or authorized representative who has indicated his/her understanding and acceptance.     Dental advisory given  Plan Discussed with: CRNA and Surgeon  Anesthesia Plan Comments:        Anesthesia Quick Evaluation

## 2023-02-21 NOTE — Discharge Instructions (Signed)
Male Sling Post Operative Instructions Dr. Luke Machen  You may notice some swelling or black and blue bruising. This is very common, and may increase slightly over the next several days. Typically it will begin to improve 1-2 weeks after surgery You may take a shower 48 hours after surgery. Avoid submerging yourself completely in water (bath tubs, hot tubs, swimming pools, etc) until 1 month after the surgery. To clean the incision, let soapy water gently wash over the area and pat lightly. Use supportive, tight-fitting underwear for the first two weeks after surgery. For example, jock straps, sliding shorts, or briefs Apply ice packs 20 minutes on/20 minutes off for at least the first 2 days following surgery. This will help  minimize swelling and discomfort. After the first few days you can continue using ice packs if they are helpful  The skin incision is closed with sutures and purple skin glue. Both of these things dissolve on their own over the course of several weeks.   Avoid lifting anything heavier than 15 pounds for 1 month Do not put any direct pressure on the incision for long periods of time for the first 6 weeks following surgery. For example, do not ride a bicycle, motorcycle, ATV, or horse.  Sitting on a soft cushion, "donut" cushion, or pillow can help with the discomfort Avoid all sexual contact for 2 weeks following the surgery. You will be prescribed an antibiotic following the surgery; please take this as prescribed.  For pain post-operatively, you will typically be prescribed 2 medicines. The first is an anti-inflammatory medication (Celebrex, Toradol, Meloxicam). The second is narcotic pain medication (Tramadol, Oxycodone). You can take these as needed, and can supplement with over the counter tylenol. I recommend limiting the amount of narcotic pain medication you take as these medications can cause constipation and in rare cases, addiction.   

## 2023-02-21 NOTE — Op Note (Addendum)
Preoperative diagnosis:  1. Stress urinary incontinence 2. History of prostate cancer s/p prostatectomy  Postoperative diagnosis: same  Procedure(s): 1. Insertion of male sling (advance XP) 2. Flexible cystoscopy  Surgeon: Dr. Irine Seal  Anesthesia: General  Complications: none  Assistant: Jerald Kief MD  EBL: 20 cc  Intraoperative findings: Good bulbar urethra displacement and urethral coaptation after placement and tightening of sling  Indication: Tyler [name] is a 67 y.o.yo M who with stress incontinence s/p prostatectomy. After an extensive discussion, he is interested in proceeding with a sling  Description of procedure:  After appropriate consent had been obtained, the patient was brought to the operative suite where anesthesia was induced. The patient was prepped and draped in the usual sterile fashion. Extra care was taken with leg positioning to minimize the risk of compartment syndrome neuropathy and deep vein thrombosis.  Preoperative antibiotics were given.  I an made approximately 5 cm incision in the perineum involving the lower aspect of the scrotum.  I dissected down through soft tissue and mobilized the subcutaneous tissue from the bulbospongiosus midline. The lonestar retractor was placed and used throughout for exposure. I then split the muscle in the midline with my usual technique. Using a curved cerebellar retractor I exposed nicely the bulbar urethra.  It was easy to see and feel the perineal tendon that was sharply taken down with Metzenbaum scissors.  There was 2 to 4 cm of mobility of the bulbar urethra towards the patient's head.  I was very pleased with identification and mobilization. I placed a 3-0 Vicryl where the tendon was initially attached to the bulb  I was very diligent at the beginning of the case and throughout the case to feel the abductor tendon relative to the obturator foramen.  I marked the area of entrance of the foramen  needle with a marking pen.  I used 22 French foramen needle to find the inferior rami and then through the foramen itself below the tendon.  I kept the angle appropriate with the patient in Trendelenburg.  I made a scalpel incision on the patient's left side approximately 1 cm in length.  With appropriate exposure I placed my finger in the upper aspect of the triangle on the patient's left side.  With the described technique I passed a trocar initially placing it against the buttock at 45 degree angle.  With my thumb I did a double pop maneuver through the soft tissues layers and then dropping the handle and delivering the tip onto the pulp of my index finger on the patient's left side.  The foramen needle was easily passed and with correct orientation the mesh was attached.  I double checked the location of the needle and was excellent.  I gently rotated the trocar with the mesh sling coming back through the skin  I did the exact same procedure on the right.  The mesh was attached in correct orientation.  Gently pulling on both slings and with my finger between the bulb and the mesh I gently brought the mesh close to the bulbar urethra.  The 3-0 Vicryl that was initially placed through the sponge was brought out through the distal aspect of the mesh.  I then gently synched the mesh down to the bulb.  I placed two 3-0 Vicryl's in the proximal aspect of the mesh through the bulbospongiosus.  I then passed another in the midline.  I was very happy with the orientation of the mesh.  I then gently tensioned  the sling over the 18 Fr foley until it "clam-shelled." I then inserted the cystoscope to examine. Ultimately I tensioned the sling just a little more and I was very happy with the coaptation of the urethra and stopped at that point. A 14 fr catheter was then inserted.  Visually and palpably there was excellent rotation of the bulbar urethra.   The sling was cut below each blue dot.  It had been rolled by  my assistant prior to help for its release.  I irrigated.  I placed a hemostat deeply across the silicone and white sheath removing them easily.  This was done on both sides.  The mesh was cut below the skin level in the inguinal creases.  I closed each inguinal incision with 2 interrupted 4-0 Vicryl followed by Dermabond.  A 4 layer closure was used for the perineum.  I was cognizant of the scrotal aspect of the closure with 3-0 Vicryl that also included reapproximation of the bulbospongiosus muscle.  The next layer was deeper subcutaneous suture with 3-0 Vicryl.  A third layer was close to the skin with level with 3-0 Vicryl.  4-0 running mattress sutures used for the perineum.  Dermabond was applied with fluff dressing  The Foley catheter was draining well at the end of the case.  Leg position was good. I was very pleased with the surgery and hopefully it reaches the patient's treatment goal  Irine Seal MD 02/21/2023, 12:30 PM  [facility]  Pager: [phone]

## 2023-02-21 NOTE — H&P (Signed)
H&P  History of Present Illness: Tyler [name] is a 67 y.o. year old M who presents today for insertion of a male sling  No acute complaints  Past Medical History:  Diagnosis Date   Allergy    Anxiety    Arthritis    Asthma    Cancer (HCC)    prostate cancer   Cataract    beginning   Colon polyps    Depression    GERD (gastroesophageal reflux disease)    Hematuria 10/05/2013   Hemorrhoids    Hyperlipidemia    Hypertension    IBS (irritable bowel syndrome)    alt diarrhea/ constipation   PONV (postoperative nausea and vomiting)    Rosacea     Past Surgical History:  Procedure Laterality Date   cataract surgery      COLONOSCOPY  07/28/2006   LYMPH NODE DISSECTION Bilateral 09/23/2021   Procedure: PELVIC LYMPH NODE DISSECTION;  Surgeon: Crist Fat, MD;  Location: [facility];  Service: Urology;  Laterality: Bilateral;   ROBOT ASSISTED LAPAROSCOPIC RADICAL PROSTATECTOMY N/A 09/23/2021   Procedure: XI ROBOTIC ASSISTED LAPAROSCOPIC RADICAL PROSTATECTOMY;  Surgeon: Crist Fat, MD;  Location: [facility];  Service: Urology;  Laterality: N/A;  4.5 HRS    Home Medications:  Current Meds  Medication Sig   ALPRAZolam (XANAX) 0.25 MG tablet Take 1 tablet (0.25 mg total) by mouth 2 (two) times daily as needed for anxiety (do not drive for 6-8 hours after taking).   bisoprolol (ZEBETA) 5 MG tablet TAKE 1/2 TABLET(2.5 MG) BY MOUTH DAILY   busPIRone (BUSPAR) 5 MG tablet TAKE 1 TABLET BY MOUTH TWICE A DAY   diphenhydramine-acetaminophen (TYLENOL PM) 25-500 MG TABS tablet Take 1-1.5 tablets by mouth at bedtime.   docusate sodium (COLACE) 100 MG capsule Take 300 mg by mouth at bedtime.   FIBER PO Take 3 tablets by mouth daily.   Glycerin, PF, (OPTASE COMFORT DRY EYE) 1 % SOLN Place 1 drop into both eyes daily as needed (dry eyes).   mometasone (ELOCON) 0.1 % cream Apply 1 Application topically daily as needed (eczema).   omeprazole (PRILOSEC) 40 MG capsule TAKE 1 CAPSULE BY  MOUTH DAILY   Probiotic Product (PROBIOTIC PO) Take 2 capsules by mouth daily.   rosuvastatin (CRESTOR) 20 MG tablet TAKE 1 TABLET(20 MG) BY MOUTH 1 TIME A WEEK   [DISCONTINUED] omeprazole (PRILOSEC) 40 MG capsule Take 1 capsule (40 mg total) by mouth daily.    Allergies:  Allergies  Allergen Reactions   Penicillins     Mouth sores    Family History  Problem Relation Age of Onset   Glaucoma Mother        and father   Coronary artery disease Mother        late 59s-cabg. died at 65   Breast cancer Mother        dx. 60s   Coronary artery disease Father 34       death 39, presumed due to MI as sudden   Stomach cancer Maternal Uncle    Breast cancer Paternal Aunt    Prostate cancer Paternal Grandfather        dx. 80s   Colon cancer Neg Hx    Colon polyps Neg Hx    Esophageal cancer Neg Hx    Rectal cancer Neg Hx     Social History:  reports that he quit smoking about 49 years ago. His smoking use included cigarettes. He started smoking about 53 years  ago. He has a 3 pack-year smoking history. He has never used smokeless tobacco. He reports current alcohol use. He reports that he does not use drugs.  ROS: A complete review of systems was performed.  All systems are negative except for pertinent findings as noted.  Physical Exam:  Vital signs in last 24 hours: Temp:  [98.7 F (37.1 C)] 98.7 F (37.1 C) (07/23 0809) Pulse Rate:  [78] 78 (07/23 0809) Resp:  [18] 18 (07/23 0809) BP: (141)/(99) 141/99 (07/23 0809) SpO2:  [98 %] 98 % (07/23 0809) Weight:  [70.3 kg] 70.3 kg (07/23 0823) Constitutional:  Alert and oriented, No acute distress Cardiovascular: Regular rate and rhythm Respiratory: Normal respiratory effort, Lungs clear bilaterally GI: Abdomen is soft, nontender, nondistended, no abdominal masses Lymphatic: No lymphadenopathy Neurologic: Grossly intact, no focal deficits Psychiatric: Normal mood and affect   Laboratory Data:  No results for input(s): "WBC",  "HGB", "HCT", "PLT" in the last 72 hours.  No results for input(s): "NA", "K", "CL", "GLUCOSE", "BUN", "CALCIUM", "CREATININE" in the last 72 hours.  Invalid input(s): "CO3"   No results found for this or any previous visit (from the past 24 hour(s)). No results found for this or any previous visit (from the past 240 hour(s)).  Renal Function: No results for input(s): "CREATININE" in the last 168 hours. Estimated Creatinine Clearance: 63.6 mL/min (by C-G formula based on SCr of 1.04 mg/dL).  Radiologic Imaging: No results found.  Assessment:  CLOUD Jennilyn Esteve is a 67 y.o. year old M with SUI after prostatectomy  Plan:  To OR as planned Procedure and risks reviewed, including but not limited to bleeding, infection, implant infection, implant malfunction, failure to improve incontinence, implant malplacement, erosion, damage to adjacent structures, pain, urinary retention. All questions answered   Irine Seal, MD 02/21/2023, 9:58 AM  [facility] Pager: [phone]

## 2023-02-21 NOTE — Transfer of Care (Signed)
Immediate Anesthesia Transfer of Care Note  Patient: Tyler [name]  Procedure(s) Performed: INSERTION OF MALE SLING  Patient Location: PACU  Anesthesia Type:General  Level of Consciousness: awake, alert , oriented, and patient cooperative  Airway & Oxygen Therapy: Patient Spontanous Breathing and Patient connected to face mask oxygen  Post-op Assessment: Report given to RN, Post -op Vital signs reviewed and stable, and Patient moving all extremities  Post vital signs: Reviewed and stable  Last Vitals:  Vitals Value Taken Time  BP 127/75 02/21/23 1240  Temp 36.7 C 02/21/23 1240  Pulse 76 02/21/23 1240  Resp 13 02/21/23 1240  SpO2 97 % 02/21/23 1240  Vitals shown include unfiled device data.  Last Pain:  Vitals:   02/21/23 0823  TempSrc:   PainSc: 3       Patients Stated Pain Goal: 3 (02/21/23 1610)  Complications: No notable events documented.

## 2023-02-21 NOTE — Anesthesia Procedure Notes (Signed)
Procedure Name: LMA Insertion Date/Time: 02/21/2023 10:38 AM  Performed by: Johnette Abraham, CRNAPre-anesthesia Checklist: Patient identified, Emergency Drugs available, Suction available and Patient being monitored Patient Re-evaluated:Patient Re-evaluated prior to induction Oxygen Delivery Method: Circle System Utilized Preoxygenation: Pre-oxygenation with 100% oxygen Induction Type: IV induction Ventilation: Mask ventilation without difficulty LMA: LMA inserted LMA Size: 4.0 Number of attempts: 1 Airway Equipment and Method: Bite block Placement Confirmation: positive ETCO2 Tube secured with: Tape Dental Injury: Teeth and Oropharynx as per pre-operative assessment

## 2023-02-21 NOTE — Anesthesia Postprocedure Evaluation (Signed)
Anesthesia Post Note  Patient: Tyler [name]  Procedure(s) Performed: INSERTION OF MALE SLING     Patient location during evaluation: PACU Anesthesia Type: General Level of consciousness: awake and alert, patient cooperative and oriented Pain management: pain level controlled Vital Signs Assessment: post-procedure vital signs reviewed and stable Respiratory status: spontaneous breathing, nonlabored ventilation and respiratory function stable Cardiovascular status: blood pressure returned to baseline and stable Postop Assessment: no apparent nausea or vomiting, able to ambulate and adequate PO intake Anesthetic complications: no   No notable events documented.  Last Vitals:  Vitals:   02/21/23 1320 02/21/23 1330  BP: (!) 150/89 137/85  Pulse: 68 69  Resp: 20   Temp: 36.6 C   SpO2: 97% 99%    Last Pain:  Vitals:   02/21/23 1320  TempSrc:   PainSc: 0-No pain                 Burch Marchuk,E. Anuhea Gassner

## 2023-02-22 ENCOUNTER — Encounter (HOSPITAL_COMMUNITY): Payer: Self-pay | Admitting: Urology

## 2023-02-28 ENCOUNTER — Encounter: Payer: Self-pay | Admitting: Family Medicine

## 2023-02-28 ENCOUNTER — Telehealth: Payer: Medicare Other | Admitting: Family Medicine

## 2023-02-28 VITALS — Ht 67.0 in | Wt 153.0 lb

## 2023-02-28 DIAGNOSIS — F411 Generalized anxiety disorder: Secondary | ICD-10-CM | POA: Diagnosis not present

## 2023-02-28 MED ORDER — BUSPIRONE HCL 5 MG PO TABS: 5.0000 mg | ORAL_TABLET | Freq: Three times a day (TID) | ORAL | 5 refills | Status: DC

## 2023-02-28 NOTE — Progress Notes (Signed)
Phone [phone] Virtual visit via Video note   Subjective:  Chief complaint: Chief Complaint  Patient presents with   Anxiety    Pt would like to discuss anxiety meds    Our team/I connected with [name] at  4:00 PM EDT by a video enabled telemedicine application (caregility through epic) and verified that I am speaking with the correct person using two identifiers. Our team/I discussed the limitations of evaluation and management by telemedicine and the availability of in person appointments.No physical exam was performed (except for noted visual exam or audio findings with Telehealth visits).   Location patient: Home[place] Location provider: [facility], office Persons participating in the virtual visit:  patient  Past Medical History-  Patient Active Problem List   Diagnosis Date Noted   Prostate cancer (HCC) 08/13/2021    Priority: High   Palpitations 11/22/2017    Priority: Medium    Memory difficulties 11/22/2017    Priority: Medium    Essential hypertension 11/21/2014    Priority: Medium    BPH (benign prostatic hyperplasia) 11/21/2014    Priority: Medium    Irritable bowel syndrome 04/17/2008    Priority: Medium    Anxiety state 12/27/2007    Priority: Medium    Hyperlipidemia 10/26/2007    Priority: Medium    Allergic rhinitis 11/21/2014    Priority: Low   Former smoker 11/21/2014    Priority: Low   Plantar fasciitis of left foot 10/22/2014    Priority: Low   Metatarsalgia of both feet 09/26/2014    Priority: Low   Hematuria 10/05/2013    Priority: Low   External hemorrhoids 04/17/2008    Priority: Low   History of colonic polyps 04/16/2008    Priority: Low   Asthma 12/27/2007    Priority: Low   GERD 12/27/2007    Priority: Low   Rosacea 10/26/2007    Priority: Low   COVID-19 08/12/2022   Genetic testing 08/23/2021   Family history of breast cancer 08/13/2021   Family history of prostate cancer 08/13/2021    Medications- reviewed and  updated Current Outpatient Medications  Medication Sig Dispense Refill   ALPRAZolam (XANAX) 0.25 MG tablet Take 1 tablet (0.25 mg total) by mouth 2 (two) times daily as needed for anxiety (do not drive for 6-8 hours after taking). 30 tablet 2   bisoprolol (ZEBETA) 5 MG tablet TAKE 1/2 TABLET(2.5 MG) BY MOUTH DAILY 45 tablet 3   busPIRone (BUSPAR) 5 MG tablet Take 1 tablet (5 mg total) by mouth 3 (three) times daily. 90 tablet 5   celecoxib (CELEBREX) 200 MG capsule Take 1 capsule (200 mg total) by mouth 2 (two) times daily for 28 days. 28 capsule 1   diphenhydramine-acetaminophen (TYLENOL PM) 25-500 MG TABS tablet Take 1-1.5 tablets by mouth at bedtime.     docusate sodium (COLACE) 100 MG capsule Take 300 mg by mouth at bedtime.     FIBER PO Take 3 tablets by mouth daily.     Glycerin, PF, (OPTASE COMFORT DRY EYE) 1 % SOLN Place 1 drop into both eyes daily as needed (dry eyes).     mometasone (ELOCON) 0.1 % cream Apply 1 Application topically daily as needed (eczema).     omeprazole (PRILOSEC) 40 MG capsule TAKE 1 CAPSULE BY MOUTH DAILY 90 capsule 0   oxyCODONE (ROXICODONE) 5 MG immediate release tablet Take 1 tablet (5 mg total) by mouth every 6 (six) hours as needed for severe pain. (Patient not taking: Reported on  02/28/2023) 16 tablet 0   Pramoxine-HC (HYDROCORTISONE ACE-PRAMOXINE) 2.5-1 % CREA Apply 1 Application topically 2 (two) times daily as needed. 30 g 2   Probiotic Product (PROBIOTIC PO) Take 2 capsules by mouth daily.     promethazine (PHENERGAN) 12.5 MG tablet Take 1 tablet (12.5 mg total) by mouth every 6 (six) hours as needed for nausea or vomiting. (Patient not taking: Reported on 02/28/2023) 20 tablet 0   rosuvastatin (CRESTOR) 20 MG tablet TAKE 1 TABLET(20 MG) BY MOUTH 1 TIME A WEEK 14 tablet 3   No current facility-administered medications for this visit.     Objective:  Ht 5\' 7"  (1.702 m)   Wt 153 lb (69.4 kg)   BMI 23.96 kg/m  self reported vitals Gen: NAD, resting  comfortably Lungs: nonlabored, normal respiratory rate  Skin: appears dry, no obvious rash     Assessment and Plan    #Stress incontinence- S: recent surgery with Dr. Lafonda Mosses for incontinence with male sling. currently taking Celebrex and tylenol post surgery  # Anxiety S:Medication:  buspirone 5 mg twice daily (morning and afternoon- a few times took in evening and not sure if helped)  with alprazolam as backup Counseling: see below- not at present -recent male sling placed as above- oxycodone for a few days then later stopped now down to tylenol -has felt very anxious since surgery and having a hard time sleeping- gets very uptight at night. Wakes up from sleep. Past 2 nights when he has gotten up to go urinate has taken an alprazolam and gets back to sleep for 4-5 hours. Dreads going to bed at night.  -Saturday night had diarrhea and abdominal pain and seemed to peak his symptoms- gas-x and lots of water helpful. Finally connected with urology yesterday and thought possible stomachissue vs bladder spasm- that amped up sleep issues even more -he reports back in his 20's he did have similar episodes and therapy was helpful - is trying to stay still as much as possible as instructed with surgery    02/28/2023    3:05 PM 01/12/2023    9:20 AM 09/15/2020    8:33 AM  GAD 7 : Generalized Anxiety Score  Nervous, Anxious, on Edge 3 1 1   Control/stop worrying 3 0 0  Worry too much - different things 2 0 1  Trouble relaxing 3 0 0  Restless 1 0 0  Easily annoyed or irritable 3 0 1  Afraid - awful might happen 3 0 0  Total GAD 7 Score 18 1 3   Anxiety Difficulty Somewhat difficult Not difficult at all Not difficult at all  A/P: Patient with baseline anxiety but significant worsening of anxiety after recent surgery that appears to be situational.  Patient really enjoys exercise and this is obviously been cut back by surgery I think that is playing a role.  Poor sleep and then cycle of dragging  sleep the next night I think is also creating an issue - We discussed potentially a week of taking alprazolam before bed to try to help him sleep better and then alleviate the stress of worrying about sleep during the daytime.  If not able to fall asleep within an hour or if wakes up after several hours can take a second dose as long as he does not drive for 8 hours after use.  We discussed potential dependence risk but thought benefits of short-term use would be helpful - For daytime anxiety we are going to increase buspirone to 3 times  a day at 5 mg - Discussed potential SSRIs but would have longer.  To see benefit - Discussed therapy and he is going to consider this with [facility] and/or [facility] counseling -Asked him to update me in about a week on MyChart  Recommended follow up: Return for next already scheduled visit or sooner if needed. Future Appointments  Date Time Provider Department Center  01/04/2024 11:45 AM [facility] ANNUAL WELLNESS VISIT 1 [facility]  01/11/2024  9:20 AM Durene Cal, Aldine Contes, MD [facility]    Lab/Order associations:   ICD-10-CM   1. Anxiety state  F41.1       Meds ordered this encounter  Medications   busPIRone (BUSPAR) 5 MG tablet    Sig: Take 1 tablet (5 mg total) by mouth 3 (three) times daily.    Dispense:  90 tablet    Refill:  5    Return precautions advised.  Tana Conch, MD

## 2023-02-28 NOTE — Patient Instructions (Addendum)
Health Maintenance Due  Topic Date Due   COVID-19 Vaccine (6 - 2023-24 season) 04/01/2022    Recommended follow up: Return for next already scheduled visit or sooner if needed.

## 2023-03-26 ENCOUNTER — Other Ambulatory Visit: Payer: Self-pay | Admitting: Family Medicine

## 2023-03-27 ENCOUNTER — Other Ambulatory Visit: Payer: Self-pay

## 2023-03-27 MED ORDER — ROSUVASTATIN CALCIUM 20 MG PO TABS: ORAL_TABLET | ORAL | 3 refills | Status: DC

## 2023-04-18 ENCOUNTER — Other Ambulatory Visit: Payer: Self-pay | Admitting: Gastroenterology

## 2023-05-26 ENCOUNTER — Encounter: Payer: Self-pay | Admitting: Family Medicine

## 2023-05-26 DIAGNOSIS — E785 Hyperlipidemia, unspecified: Secondary | ICD-10-CM

## 2023-05-29 NOTE — Telephone Encounter (Signed)
See below

## 2023-05-30 ENCOUNTER — Telehealth: Payer: Medicare Other | Admitting: Physician Assistant

## 2023-05-30 DIAGNOSIS — B9689 Other specified bacterial agents as the cause of diseases classified elsewhere: Secondary | ICD-10-CM

## 2023-05-30 DIAGNOSIS — J019 Acute sinusitis, unspecified: Secondary | ICD-10-CM | POA: Diagnosis not present

## 2023-05-30 MED ORDER — BENZONATATE 100 MG PO CAPS
100.0000 mg | ORAL_CAPSULE | Freq: Three times a day (TID) | ORAL | 0 refills | Status: DC | PRN
Start: 2023-05-30 — End: 2024-01-11

## 2023-05-30 MED ORDER — ALBUTEROL SULFATE HFA 108 (90 BASE) MCG/ACT IN AERS
2.0000 | INHALATION_SPRAY | Freq: Four times a day (QID) | RESPIRATORY_TRACT | 0 refills | Status: AC | PRN
Start: 2023-05-30 — End: ?

## 2023-05-30 MED ORDER — DOXYCYCLINE HYCLATE 100 MG PO TABS: 100.0000 mg | ORAL_TABLET | Freq: Two times a day (BID) | ORAL | 0 refills | Status: DC

## 2023-05-30 NOTE — Progress Notes (Signed)
Virtual Visit Consent   Tyler [name], you are scheduled for a virtual visit with a [facility] provider today. Just as with appointments in the office, your consent must be obtained to participate. Your consent will be active for this visit and any virtual visit you may have with one of our providers in the next 365 days. If you have a MyChart account, a copy of this consent can be sent to you electronically.  As this is a virtual visit, video technology does not allow for your provider to perform a traditional examination. This may limit your provider's ability to fully assess your condition. If your provider identifies any concerns that need to be evaluated in person or the need to arrange testing (such as labs, EKG, etc.), we will make arrangements to do so. Although advances in technology are sophisticated, we cannot ensure that it will always work on either your end or our end. If the connection with a video visit is poor, the visit may have to be switched to a telephone visit. With either a video or telephone visit, we are not always able to ensure that we have a secure connection.  By engaging in this virtual visit, you consent to the provision of healthcare and authorize for your insurance to be billed (if applicable) for the services provided during this visit. Depending on your insurance coverage, you may receive a charge related to this service.  I need to obtain your verbal consent now. Are you willing to proceed with your visit today? Tyler [name] has provided verbal consent on 05/30/2023 for a virtual visit (video or telephone). Tyler [name], [place]  Date: 05/30/2023 1:30 PM  Virtual Visit via Video Note   I, Tyler [name], connected with  Tyler [name]  ([id number], 07-Dec-1955) on 05/30/23 at  1:30 PM EDT by a video-enabled telemedicine application and verified that I am speaking with the correct person using two identifiers.  Location: Patient: Virtual Visit  Location Patient: Home Provider: Virtual Visit Location Provider: Home Office   I discussed the limitations of evaluation and management by telemedicine and the availability of in person appointments. The patient expressed understanding and agreed to proceed.    History of Present Illness: Tyler [name] is a 67 y.o. who identifies as a male who was assigned male at birth, and is being seen today for URI symptoms starting > 1 week ago with nasal congestion, rhinorrhea, coughing that initially he attributed to allergies. As such is taking OTC Xyzal and Tussin without much improvement. Now with deeper cough that is productive and some occasional wheezing.  Notes sinus headache and pain. Denies ear pain. Denies chest pain or SOB. Wife recently with COVID but patient tested at home x 3 and was negative.    HPI: HPI  Problems:  Patient Active Problem List   Diagnosis Date Noted   COVID-19 08/12/2022   Genetic testing 08/23/2021   Prostate cancer (HCC) 08/13/2021   Family history of breast cancer 08/13/2021   Family history of prostate cancer 08/13/2021   Palpitations 11/22/2017   Memory difficulties 11/22/2017   Essential hypertension 11/21/2014   Allergic rhinitis 11/21/2014   BPH (benign prostatic hyperplasia) 11/21/2014   Former smoker 11/21/2014   Plantar fasciitis of left foot 10/22/2014   Metatarsalgia of both feet 09/26/2014   Hematuria 10/05/2013   External hemorrhoids 04/17/2008   Irritable bowel syndrome 04/17/2008   History of colonic polyps 04/16/2008   Anxiety state 12/27/2007   Asthma  12/27/2007   GERD 12/27/2007   Hyperlipidemia 10/26/2007   Rosacea 10/26/2007    Allergies:  Allergies  Allergen Reactions   Penicillins     Mouth sores   Medications:  Current Outpatient Medications:    albuterol (VENTOLIN HFA) 108 (90 Base) MCG/ACT inhaler, Inhale 2 puffs into the lungs every 6 (six) hours as needed for wheezing or shortness of breath., Disp: 8 g, Rfl: 0    benzonatate (TESSALON) 100 MG capsule, Take 1 capsule (100 mg total) by mouth 3 (three) times daily as needed for cough., Disp: 30 capsule, Rfl: 0   doxycycline (VIBRA-TABS) 100 MG tablet, Take 1 tablet (100 mg total) by mouth 2 (two) times daily., Disp: 20 tablet, Rfl: 0   ALPRAZolam (XANAX) 0.25 MG tablet, Take 1 tablet (0.25 mg total) by mouth 2 (two) times daily as needed for anxiety (do not drive for 6-8 hours after taking)., Disp: 30 tablet, Rfl: 2   bisoprolol (ZEBETA) 5 MG tablet, TAKE 1/2 TABLET(2.5 MG) BY MOUTH DAILY, Disp: 45 tablet, Rfl: 3   busPIRone (BUSPAR) 5 MG tablet, Take 1 tablet (5 mg total) by mouth 3 (three) times daily., Disp: 90 tablet, Rfl: 5   diphenhydramine-acetaminophen (TYLENOL PM) 25-500 MG TABS tablet, Take 1-1.5 tablets by mouth at bedtime., Disp: , Rfl:    docusate sodium (COLACE) 100 MG capsule, Take 300 mg by mouth at bedtime., Disp: , Rfl:    FIBER PO, Take 3 tablets by mouth daily., Disp: , Rfl:    Glycerin, PF, (OPTASE COMFORT DRY EYE) 1 % SOLN, Place 1 drop into both eyes daily as needed (dry eyes)., Disp: , Rfl:    mometasone (ELOCON) 0.1 % cream, Apply 1 Application topically daily as needed (eczema)., Disp: , Rfl:    omeprazole (PRILOSEC) 40 MG capsule, TAKE 1 CAPSULE BY MOUTH DAILY, Disp: 90 capsule, Rfl: 0   Pramoxine-HC (HYDROCORTISONE ACE-PRAMOXINE) 2.5-1 % CREA, Apply 1 Application topically 2 (two) times daily as needed., Disp: 30 g, Rfl: 2   Probiotic Product (PROBIOTIC PO), Take 2 capsules by mouth daily., Disp: , Rfl:    rosuvastatin (CRESTOR) 20 MG tablet, TAKE 1 TABLET(20 MG) BY MOUTH 1 TIME A WEEK, Disp: 14 tablet, Rfl: 3  Observations/Objective: Patient is well-developed, well-nourished in no acute distress.  Resting comfortably at home.  Head is normocephalic, atraumatic.  No labored breathing. Speech is clear and coherent with logical content.  Patient is alert and oriented at baseline.   Assessment and Plan: 1. Acute bacterial  sinusitis - benzonatate (TESSALON) 100 MG capsule; Take 1 capsule (100 mg total) by mouth 3 (three) times daily as needed for cough.  Dispense: 30 capsule; Refill: 0 - doxycycline (VIBRA-TABS) 100 MG tablet; Take 1 tablet (100 mg total) by mouth 2 (two) times daily.  Dispense: 20 tablet; Refill: 0 - albuterol (VENTOLIN HFA) 108 (90 Base) MCG/ACT inhaler; Inhale 2 puffs into the lungs every 6 (six) hours as needed for wheezing or shortness of breath.  Dispense: 8 g; Refill: 0  Rx Doxycycline.  Increase fluids.  Rest.  Saline nasal spray.  Probiotic.  Mucinex as directed.  Humidifier in bedroom. Tessalon per orders. Ok to start Flonase OTC short-term.  Call or return to clinic if symptoms are not improving.   Follow Up Instructions: I discussed the assessment and treatment plan with the patient. The patient was provided an opportunity to ask questions and all were answered. The patient agreed with the plan and demonstrated an understanding of the instructions.  A copy of instructions were sent to the patient via MyChart unless otherwise noted below.   The patient was advised to call back or seek an in-person evaluation if the symptoms worsen or if the condition fails to improve as anticipated.    Tyler Climes, PA-C

## 2023-05-30 NOTE — Patient Instructions (Addendum)
Tyler [name], thank you for joining [name], PA-C for today's virtual visit.  While this provider is not your primary care provider (PCP), if your PCP is located in our provider database this encounter information will be shared with them immediately following your visit.   A [employer] MyChart account gives you access to today's visit and all your visits, tests, and labs performed at [facility] " click here if you don't have a [facility] MyChart account or go to [email or web address]  Consent: (Patient) Tyler [name] provided verbal consent for this virtual visit at the beginning of the encounter.  Current Medications:  Current Outpatient Medications:    ALPRAZolam (XANAX) 0.25 MG tablet, Take 1 tablet (0.25 mg total) by mouth 2 (two) times daily as needed for anxiety (do not drive for 6-8 hours after taking)., Disp: 30 tablet, Rfl: 2   bisoprolol (ZEBETA) 5 MG tablet, TAKE 1/2 TABLET(2.5 MG) BY MOUTH DAILY, Disp: 45 tablet, Rfl: 3   busPIRone (BUSPAR) 5 MG tablet, Take 1 tablet (5 mg total) by mouth 3 (three) times daily., Disp: 90 tablet, Rfl: 5   diphenhydramine-acetaminophen (TYLENOL PM) 25-500 MG TABS tablet, Take 1-1.5 tablets by mouth at bedtime., Disp: , Rfl:    docusate sodium (COLACE) 100 MG capsule, Take 300 mg by mouth at bedtime., Disp: , Rfl:    FIBER PO, Take 3 tablets by mouth daily., Disp: , Rfl:    Glycerin, PF, (OPTASE COMFORT DRY EYE) 1 % SOLN, Place 1 drop into both eyes daily as needed (dry eyes)., Disp: , Rfl:    mometasone (ELOCON) 0.1 % cream, Apply 1 Application topically daily as needed (eczema)., Disp: , Rfl:    omeprazole (PRILOSEC) 40 MG capsule, TAKE 1 CAPSULE BY MOUTH DAILY, Disp: 90 capsule, Rfl: 0   oxyCODONE (ROXICODONE) 5 MG immediate release tablet, Take 1 tablet (5 mg total) by mouth every 6 (six) hours as needed for severe pain. (Patient not taking: Reported on 02/28/2023), Disp: 16 tablet, Rfl: 0   Pramoxine-HC  (HYDROCORTISONE ACE-PRAMOXINE) 2.5-1 % CREA, Apply 1 Application topically 2 (two) times daily as needed., Disp: 30 g, Rfl: 2   Probiotic Product (PROBIOTIC PO), Take 2 capsules by mouth daily., Disp: , Rfl:    promethazine (PHENERGAN) 12.5 MG tablet, Take 1 tablet (12.5 mg total) by mouth every 6 (six) hours as needed for nausea or vomiting. (Patient not taking: Reported on 02/28/2023), Disp: 20 tablet, Rfl: 0   rosuvastatin (CRESTOR) 20 MG tablet, TAKE 1 TABLET(20 MG) BY MOUTH 1 TIME A WEEK, Disp: 14 tablet, Rfl: 3   Medications ordered in this encounter:  No orders of the defined types were placed in this encounter.    *If you need refills on other medications prior to your next appointment, please contact your pharmacy*  Follow-Up: Call back or seek an in-person evaluation if the symptoms worsen or if the condition fails to improve as anticipated.  [facility] [phone]  Other Instructions Please take antibiotic as directed.  Increase fluid intake.  Use Saline nasal spray.  Take a daily multivitamin. Ok to continue your OTC medications, adding on Flonase nasal spray short-term.  Place a humidifier in the bedroom.  Please call or return clinic if symptoms are not improving.  Sinusitis Sinusitis is redness, soreness, and swelling (inflammation) of the paranasal sinuses. Paranasal sinuses are air pockets within the bones of your face (beneath the eyes, the middle of the forehead, or above the eyes). In  healthy paranasal sinuses, mucus is able to drain out, and air is able to circulate through them by way of your nose. However, when your paranasal sinuses are inflamed, mucus and air can become trapped. This can allow bacteria and other germs to grow and cause infection. Sinusitis can develop quickly and last only a short time (acute) or continue over a long period (chronic). Sinusitis that lasts for more than 12 weeks is considered chronic.  CAUSES  Causes of sinusitis  include: Allergies. Structural abnormalities, such as displacement of the cartilage that separates your nostrils (deviated septum), which can decrease the air flow through your nose and sinuses and affect sinus drainage. Functional abnormalities, such as when the small hairs (cilia) that line your sinuses and help remove mucus do not work properly or are not present. SYMPTOMS  Symptoms of acute and chronic sinusitis are the same. The primary symptoms are pain and pressure around the affected sinuses. Other symptoms include: Upper toothache. Earache. Headache. Bad breath. Decreased sense of smell and taste. A cough, which worsens when you are lying flat. Fatigue. Fever. Thick drainage from your nose, which often is green and may contain pus (purulent). Swelling and warmth over the affected sinuses. DIAGNOSIS  Your caregiver will perform a physical exam. During the exam, your caregiver may: Look in your nose for signs of abnormal growths in your nostrils (nasal polyps). Tap over the affected sinus to check for signs of infection. View the inside of your sinuses (endoscopy) with a special imaging device with a light attached (endoscope), which is inserted into your sinuses. If your caregiver suspects that you have chronic sinusitis, one or more of the following tests may be recommended: Allergy tests. Nasal culture A sample of mucus is taken from your nose and sent to a lab and screened for bacteria. Nasal cytology A sample of mucus is taken from your nose and examined by your caregiver to determine if your sinusitis is related to an allergy. TREATMENT  Most cases of acute sinusitis are related to a viral infection and will resolve on their own within 10 days. Sometimes medicines are prescribed to help relieve symptoms (pain medicine, decongestants, nasal steroid sprays, or saline sprays).  However, for sinusitis related to a bacterial infection, your caregiver will prescribe antibiotic  medicines. These are medicines that will help kill the bacteria causing the infection.  Rarely, sinusitis is caused by a fungal infection. In theses cases, your caregiver will prescribe antifungal medicine. For some cases of chronic sinusitis, surgery is needed. Generally, these are cases in which sinusitis recurs more than 3 times per year, despite other treatments. HOME CARE INSTRUCTIONS  Drink plenty of water. Water helps thin the mucus so your sinuses can drain more easily. Use a humidifier. Inhale steam 3 to 4 times a day (for example, sit in the bathroom with the shower running). Apply a warm, moist washcloth to your face 3 to 4 times a day, or as directed by your caregiver. Use saline nasal sprays to help moisten and clean your sinuses. Take over-the-counter or prescription medicines for pain, discomfort, or fever only as directed by your caregiver. SEEK IMMEDIATE MEDICAL CARE IF: You have increasing pain or severe headaches. You have nausea, vomiting, or drowsiness. You have swelling around your face. You have vision problems. You have a stiff neck. You have difficulty breathing. MAKE SURE YOU:  Understand these instructions. Will watch your condition. Will get help right away if you are not doing well or get worse. Document  Released: 07/18/2005 Document Revised: 10/10/2011 Document Reviewed: 08/02/2011 [facility] Patient Information 2014 [place], [place].    If you have been instructed to have an in-person evaluation today at a local Urgent Care facility, please use the link below. It will take you to a list of all of our available [facility], including address, phone number and hours of operation. Please do not delay care.  [facility]  If you or a family member do not have a primary care provider, use the link below to schedule a visit and establish care. When you choose a [facility] primary care physician or advanced practice provider, you gain a  long-term partner in health. Find a Primary Care Provider  Learn more about [facility]'s in-office and virtual care options: [facility] - Get Care Now

## 2023-06-07 ENCOUNTER — Encounter: Payer: Self-pay | Admitting: Family

## 2023-06-07 ENCOUNTER — Ambulatory Visit: Payer: Medicare Other | Admitting: Family

## 2023-06-07 VITALS — BP 134/91 | HR 81 | Temp 98.0°F | Ht 67.0 in | Wt 157.6 lb

## 2023-06-07 DIAGNOSIS — J069 Acute upper respiratory infection, unspecified: Secondary | ICD-10-CM

## 2023-06-07 MED ORDER — METHYLPREDNISOLONE ACETATE 80 MG/ML IJ SUSP
80.0000 mg | Freq: Once | INTRAMUSCULAR | Status: AC
Start: 2023-06-07 — End: 2023-06-07
  Administered 2023-06-07: 80 mg via INTRAMUSCULAR

## 2023-06-07 NOTE — Addendum Note (Signed)
Addended byDulce Sellar on: 06/07/2023 12:11 PM   Modules accepted: Level of Service

## 2023-06-07 NOTE — Telephone Encounter (Signed)
Patient has been scheduled for follow up with Dulce Sellar today @ 10 am.

## 2023-06-07 NOTE — Progress Notes (Signed)
Patient ID: Tyler [name], male    DOB: 24-Apr-1956, 67 y.o.   MRN: [id number]  Chief Complaint  Patient presents with   Cough    Pt c/o Cough, Present for 2 weeks. Has tried Delsym at home. Pt had a virtual visit on 10/29 and was given doxycylcine and tessalon perles which did help slightly. Pt has two more days or the abx.    Discussed the use of AI scribe software for clinical note transcription with the patient, who gave verbal consent to proceed.  History of Present Illness   The patient, with a history of sinusitis and bronchitis, presents with a persistent cough that has been ongoing for several weeks. The cough was initially productive, but has since become dry. The cough is worse in the morning and at night. Despite the cough, the patient has been able to maintain his exercise routine, including walking two miles and going to the gym. He denies shortness of breath or chest tightness. He was treated a week ago for sinusitis has finished the doxycycline and taking the Tessalon Perles prn, and has an albuterol inhaler, which he has not used. He reports that the [employer] seemed to help for a while. He also reports occasional pressure in his ears.       Assessment & Plan:     Upper Respiratory Infection - Persistent cough, initially productive but now dry. No shortness of breath or chest tightness. Mild wheezing noted in upper chest on examination. -Administer Depo-Medrol shot today to help reduce inflammation and improve symptoms. -Continue use of Tessalon Perles as needed. -Initiate Albuterol inhaler (has at home), one puff twice daily as needed for cough, can increase to 2 puffs if needed. -Continue regular exercise and hydration.     Subjective:    Outpatient Medications Prior to Visit  Medication Sig Dispense Refill   albuterol (VENTOLIN HFA) 108 (90 Base) MCG/ACT inhaler Inhale 2 puffs into the lungs every 6 (six) hours as needed for wheezing or shortness of breath. 8 g  0   ALPRAZolam (XANAX) 0.25 MG tablet Take 1 tablet (0.25 mg total) by mouth 2 (two) times daily as needed for anxiety (do not drive for 6-8 hours after taking). 30 tablet 2   benzonatate (TESSALON) 100 MG capsule Take 1 capsule (100 mg total) by mouth 3 (three) times daily as needed for cough. 30 capsule 0   bisoprolol (ZEBETA) 5 MG tablet TAKE 1/2 TABLET(2.5 MG) BY MOUTH DAILY 45 tablet 3   busPIRone (BUSPAR) 5 MG tablet Take 1 tablet (5 mg total) by mouth 3 (three) times daily. 90 tablet 5   diphenhydramine-acetaminophen (TYLENOL PM) 25-500 MG TABS tablet Take 1-1.5 tablets by mouth at bedtime.     docusate sodium (COLACE) 100 MG capsule Take 300 mg by mouth at bedtime.     doxycycline (VIBRA-TABS) 100 MG tablet Take 1 tablet (100 mg total) by mouth 2 (two) times daily. 20 tablet 0   FIBER PO Take 3 tablets by mouth daily.     Glycerin, PF, (OPTASE COMFORT DRY EYE) 1 % SOLN Place 1 drop into both eyes daily as needed (dry eyes).     mometasone (ELOCON) 0.1 % cream Apply 1 Application topically daily as needed (eczema).     omeprazole (PRILOSEC) 40 MG capsule TAKE 1 CAPSULE BY MOUTH DAILY 90 capsule 0   Pramoxine-HC (HYDROCORTISONE ACE-PRAMOXINE) 2.5-1 % CREA Apply 1 Application topically 2 (two) times daily as needed. 30 g 2   Probiotic  Product (PROBIOTIC PO) Take 2 capsules by mouth daily.     rosuvastatin (CRESTOR) 20 MG tablet TAKE 1 TABLET(20 MG) BY MOUTH 1 TIME A WEEK 14 tablet 3   No facility-administered medications prior to visit.   Past Medical History:  Diagnosis Date   Allergy    Anxiety    Arthritis    Asthma    Cancer (HCC)    prostate cancer   Cataract    beginning   Colon polyps    Depression    GERD (gastroesophageal reflux disease)    Hematuria 10/05/2013   Hemorrhoids    Hyperlipidemia    Hypertension    IBS (irritable bowel syndrome)    alt diarrhea/ constipation   PONV (postoperative nausea and vomiting)    Rosacea    Past Surgical History:  Procedure  Laterality Date   cataract surgery      COLONOSCOPY  07/28/2006   LYMPH NODE DISSECTION Bilateral 09/23/2021   Procedure: PELVIC LYMPH NODE DISSECTION;  Surgeon: Crist Fat, MD;  Location: [facility];  Service: Urology;  Laterality: Bilateral;   ROBOT ASSISTED LAPAROSCOPIC RADICAL PROSTATECTOMY N/A 09/23/2021   Procedure: XI ROBOTIC ASSISTED LAPAROSCOPIC RADICAL PROSTATECTOMY;  Surgeon: Crist Fat, MD;  Location: [facility];  Service: Urology;  Laterality: N/A;  4.5 HRS   URETHRAL SLING N/A 02/21/2023   Procedure: INSERTION OF MALE SLING;  Surgeon: Despina Arias, MD;  Location: [facility];  Service: Urology;  Laterality: N/A;  90 MINUTES NEEDED FOR CASE   Allergies  Allergen Reactions   Penicillins     Mouth sores      Objective:    Physical Exam Vitals and nursing note reviewed.  Constitutional:      General: He is not in acute distress.    Appearance: Normal appearance.  HENT:     Head: Normocephalic.     Right Ear: Tympanic membrane and ear canal normal.     Left Ear: Tympanic membrane and ear canal normal.     Nose:     Right Sinus: Frontal sinus tenderness present. No maxillary sinus tenderness.     Left Sinus: Frontal sinus tenderness present. No maxillary sinus tenderness.     Mouth/Throat:     Mouth: Mucous membranes are moist.     Pharynx: No pharyngeal swelling, oropharyngeal exudate, posterior oropharyngeal erythema or uvula swelling.     Tonsils: No tonsillar exudate or tonsillar abscesses.  Cardiovascular:     Rate and Rhythm: Normal rate and regular rhythm.  Pulmonary:     Effort: Pulmonary effort is normal.     Breath sounds: Examination of the right-upper field reveals wheezing. Wheezing (mild) present.  Musculoskeletal:        General: Normal range of motion.     Cervical back: Normal range of motion.  Lymphadenopathy:     Head:     Right side of head: No preauricular or posterior auricular adenopathy.     Left side of head: No preauricular or  posterior auricular adenopathy.     Cervical: No cervical adenopathy.  Skin:    General: Skin is warm and dry.  Neurological:     Mental Status: He is alert and oriented to person, place, and time.  Psychiatric:        Mood and Affect: Mood normal.    BP (!) 134/91 (BP Location: Left Arm, Patient Position: Sitting, Cuff Size: Normal)   Pulse 81   Temp 98 F (36.7 C) (Temporal)   Ht 5'  7" (1.702 m)   Wt 157 lb 9.6 oz (71.5 kg)   SpO2 96%   BMI 24.68 kg/m  Wt Readings from Last 3 Encounters:  06/07/23 157 lb 9.6 oz (71.5 kg)  02/28/23 153 lb (69.4 kg)  02/21/23 155 lb (70.3 kg)       Dulce Sellar, NP

## 2023-06-21 ENCOUNTER — Other Ambulatory Visit: Payer: Self-pay | Admitting: Family Medicine

## 2023-06-27 ENCOUNTER — Ambulatory Visit
Admission: RE | Admit: 2023-06-27 | Discharge: 2023-06-27 | Disposition: A | Discharge status: Home / Self Care | Payer: Medicare Other | Source: Ambulatory Visit | Attending: Family Medicine | Admitting: Family Medicine

## 2023-06-27 DIAGNOSIS — E785 Hyperlipidemia, unspecified: Secondary | ICD-10-CM

## 2023-07-06 ENCOUNTER — Telehealth: Payer: Medicare Other | Admitting: Nurse Practitioner

## 2023-07-06 DIAGNOSIS — J40 Bronchitis, not specified as acute or chronic: Secondary | ICD-10-CM | POA: Diagnosis not present

## 2023-07-06 MED ORDER — PREDNISONE 20 MG PO TABS: 20.0000 mg | ORAL_TABLET | Freq: Two times a day (BID) | ORAL | 0 refills | Status: AC

## 2023-07-06 MED ORDER — AZITHROMYCIN 250 MG PO TABS: ORAL_TABLET | ORAL | 0 refills | Status: AC

## 2023-07-06 NOTE — Progress Notes (Signed)
Virtual Visit Consent   Tyler [name], you are scheduled for a virtual visit with a [facility] provider today. Just as with appointments in the office, your consent must be obtained to participate. Your consent will be active for this visit and any virtual visit you may have with one of our providers in the next 365 days. If you have a MyChart account, a copy of this consent can be sent to you electronically.  As this is a virtual visit, video technology does not allow for your provider to perform a traditional examination. This may limit your provider's ability to fully assess your condition. If your provider identifies any concerns that need to be evaluated in person or the need to arrange testing (such as labs, EKG, etc.), we will make arrangements to do so. Although advances in technology are sophisticated, we cannot ensure that it will always work on either your end or our end. If the connection with a video visit is poor, the visit may have to be switched to a telephone visit. With either a video or telephone visit, we are not always able to ensure that we have a secure connection.  By engaging in this virtual visit, you consent to the provision of healthcare and authorize for your insurance to be billed (if applicable) for the services provided during this visit. Depending on your insurance coverage, you may receive a charge related to this service.  I need to obtain your verbal consent now. Are you willing to proceed with your visit today? Tyler [name] has provided verbal consent on 07/06/2023 for a virtual visit (video or telephone). Viviano Simas, FNP  Date: 07/06/2023 4:05 PM  Virtual Visit via Video Note   I, Viviano Simas, connected with  Tyler [name]  ([id number], 02/06/1956) on 07/06/23 at  4:45 PM EST by a video-enabled telemedicine application and verified that I am speaking with the correct person using two identifiers.  Location: Patient: Virtual Visit Location Patient:  Home Provider: Virtual Visit Location Provider: Home Office   I discussed the limitations of evaluation and management by telemedicine and the availability of in person appointments. The patient expressed understanding and agreed to proceed.    History of Present Illness: Tyler [name] is a 67 y.o. who identifies as a male who was assigned male at birth, and is being seen today for cough, congestion and sore throat.  Symptom onset was one week +  Does not seem that he is getting any better   He was treated for a sinus infection at the end of October    He has been able to be active- playing Pickle ball during the day  Stays up coughing at night  He has been using Delsym for relief  Denies fever   His cough is mainly dry but he does produce mucous  He has been using the inhaler he was given in October for relief  He does note that he had resolution of symptoms after treatment- went on a 2 week trip and then had a return of symptoms   Most recent episode of COVID was in 08/2022 Had a booster about 8 weeks ago    Denies any symptoms in his sinuses  Problems:  Patient Active Problem List   Diagnosis Date Noted   COVID-19 08/12/2022   Genetic testing 08/23/2021   Prostate cancer (HCC) 08/13/2021   Family history of breast cancer 08/13/2021   Family history of prostate cancer 08/13/2021   Palpitations 11/22/2017  Memory difficulties 11/22/2017   Essential hypertension 11/21/2014   Allergic rhinitis 11/21/2014   BPH (benign prostatic hyperplasia) 11/21/2014   Former smoker 11/21/2014   Plantar fasciitis of left foot 10/22/2014   Metatarsalgia of both feet 09/26/2014   Hematuria 10/05/2013   External hemorrhoids 04/17/2008   Irritable bowel syndrome 04/17/2008   History of colonic polyps 04/16/2008   Anxiety state 12/27/2007   Asthma 12/27/2007   GERD 12/27/2007   Hyperlipidemia 10/26/2007   Rosacea 10/26/2007    Allergies:  Allergies  Allergen Reactions    Penicillins     Mouth sores   Medications:  Current Outpatient Medications:    albuterol (VENTOLIN HFA) 108 (90 Base) MCG/ACT inhaler, Inhale 2 puffs into the lungs every 6 (six) hours as needed for wheezing or shortness of breath., Disp: 8 g, Rfl: 0   ALPRAZolam (XANAX) 0.25 MG tablet, Take 1 tablet (0.25 mg total) by mouth 2 (two) times daily as needed for anxiety (do not drive for 6-8 hours after taking)., Disp: 30 tablet, Rfl: 2   benzonatate (TESSALON) 100 MG capsule, Take 1 capsule (100 mg total) by mouth 3 (three) times daily as needed for cough., Disp: 30 capsule, Rfl: 0   bisoprolol (ZEBETA) 5 MG tablet, TAKE 1 TABLET BY MOUTH DAILY, Disp: 45 tablet, Rfl: 3   busPIRone (BUSPAR) 5 MG tablet, Take 1 tablet (5 mg total) by mouth 3 (three) times daily., Disp: 90 tablet, Rfl: 5   diphenhydramine-acetaminophen (TYLENOL PM) 25-500 MG TABS tablet, Take 1-1.5 tablets by mouth at bedtime., Disp: , Rfl:    docusate sodium (COLACE) 100 MG capsule, Take 300 mg by mouth at bedtime., Disp: , Rfl:    doxycycline (VIBRA-TABS) 100 MG tablet, Take 1 tablet (100 mg total) by mouth 2 (two) times daily., Disp: 20 tablet, Rfl: 0   FIBER PO, Take 3 tablets by mouth daily., Disp: , Rfl:    Glycerin, PF, (OPTASE COMFORT DRY EYE) 1 % SOLN, Place 1 drop into both eyes daily as needed (dry eyes)., Disp: , Rfl:    mometasone (ELOCON) 0.1 % cream, Apply 1 Application topically daily as needed (eczema)., Disp: , Rfl:    omeprazole (PRILOSEC) 40 MG capsule, TAKE 1 CAPSULE BY MOUTH DAILY, Disp: 90 capsule, Rfl: 0   Pramoxine-HC (HYDROCORTISONE ACE-PRAMOXINE) 2.5-1 % CREA, Apply 1 Application topically 2 (two) times daily as needed., Disp: 30 g, Rfl: 2   Probiotic Product (PROBIOTIC PO), Take 2 capsules by mouth daily., Disp: , Rfl:    rosuvastatin (CRESTOR) 20 MG tablet, TAKE 1 TABLET(20 MG) BY MOUTH 1 TIME A WEEK, Disp: 14 tablet, Rfl: 3  Observations/Objective: Patient is well-developed, well-nourished in no acute  distress.  Resting comfortably  at home.  Head is normocephalic, atraumatic.  No labored breathing.  Speech is clear and coherent with logical content.  Patient is alert and oriented at baseline.    Assessment and Plan:  1. Bronchitis  Continue Delsym OTC for cough relief in addition to: Increase Albuterol to every 4-6 hours as directed   - azithromycin (ZITHROMAX) 250 MG tablet; Take 2 tablets on day 1, then 1 tablet daily on days 2 through 5  Dispense: 6 tablet; Refill: 0 - predniSONE (DELTASONE) 20 MG tablet; Take 1 tablet (20 mg total) by mouth 2 (two) times daily with a meal for 5 days.  Dispense: 10 tablet; Refill: 0       Follow Up Instructions: I discussed the assessment and treatment plan with the patient. The patient  was provided an opportunity to ask questions and all were answered. The patient agreed with the plan and demonstrated an understanding of the instructions.  A copy of instructions were sent to the patient via MyChart unless otherwise noted below.    The patient was advised to call back or seek an in-person evaluation if the symptoms worsen or if the condition fails to improve as anticipated.    Viviano Simas, FNP

## 2023-07-07 ENCOUNTER — Other Ambulatory Visit: Payer: Self-pay | Admitting: Family Medicine

## 2023-07-13 ENCOUNTER — Encounter: Payer: Self-pay | Admitting: Family Medicine

## 2023-07-17 ENCOUNTER — Other Ambulatory Visit: Payer: Self-pay | Admitting: Gastroenterology

## 2023-08-06 IMAGING — MR MR PROSTATE WO/W CM
12 series · 48 of 48 positions shown · IV contrast (multihance)
Comparison: None.

CLINICAL DATA: Elevated PSA level of 5.38 on 05/19/2021

EXAM:
MR PROSTATE WITHOUT AND WITH CONTRAST
TECHNIQUE: Multiplanar multisequence MRI images were obtained of the pelvis
centered about the prostate. Pre and post contrast images were
obtained.
CONTRAST:  14mL MULTIHANCE GADOBENATE DIMEGLUMINE 529 MG/ML IV SOLN

[Series 3: T2 · coronal · 3.0mm · 0.56mm/px · 1 of 23 slices shown (1 of 3)]
[im 1/23]
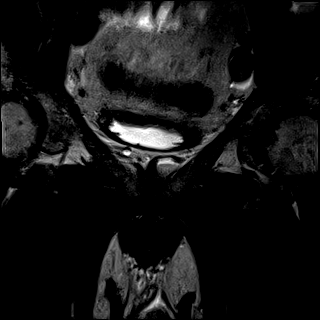

[Series 4: T1 · axial · 5.0mm · 1.25mm/px · 1 of 80 slices shown]
[im 1/80]
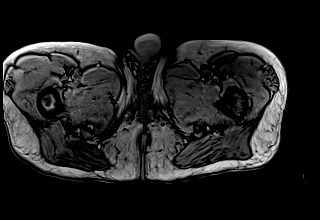

[Series 5: DWI · axial · 3.0mm · 1.75mm/px · z∈[-45,+12]mm · 2 of 60 slices shown (1 of 3)]
[im 1/60]
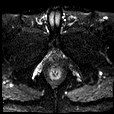
[im 60/60]
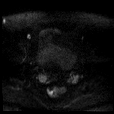

[Series 6: DWI · axial · 3.0mm · 1.75mm/px · 1 of 20 slices shown (2 of 3)]
[im 1/20]
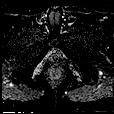

[Series 7: DWI · axial · 3.0mm · 1.75mm/px · 1 of 20 slices shown (3 of 3)]
[im 1/20]
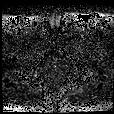

[Series 8: T2 · axial · 3.0mm · 0.56mm/px · 1 of 23 slices shown (2 of 3)]
[im 1/23]
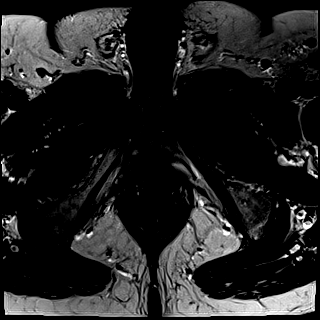

[Series 9: T2 · axial · 1.0mm · 1.04mm/px · z∈[-56,+15]mm · 2 of 72 slices shown (3 of 3)]
[im 1/72]
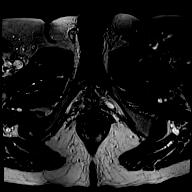
[im 72/72]
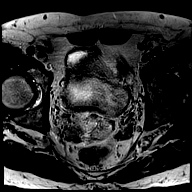

[Series 10: pre t1_twist_tra_dyn · axial · non-contrast · 3.5mm · 0.83mm/px · 1 of 20 slices shown]
[im 1/20]
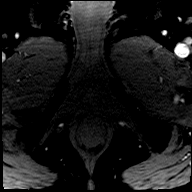

[Series 11: post t1_twist_tra_dyn-copy center · axial · non-contrast · 3.5mm · 0.83mm/px · z∈[-54,+12]mm · 17 of 600 slices shown]
[im 1/600]
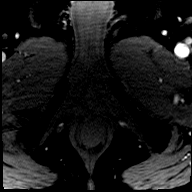
[im 38/600]
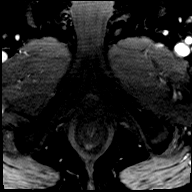
[im 75/600]
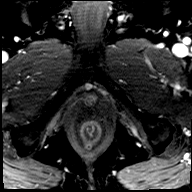
[im 113/600]
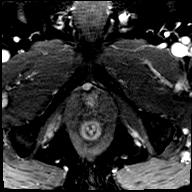
[im 150/600]
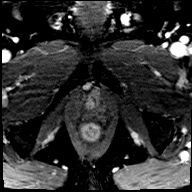
[im 188/600]
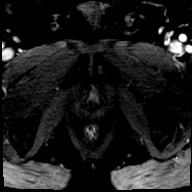
[im 225/600]
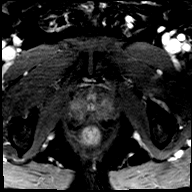
[im 263/600]
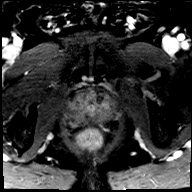
[im 300/600]
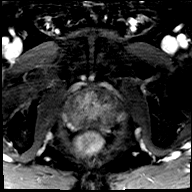
[im 337/600]
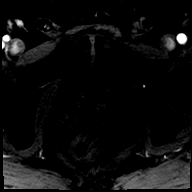
[im 375/600]
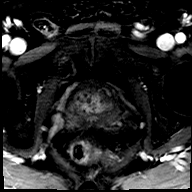
[im 412/600]
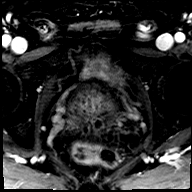
[im 450/600]
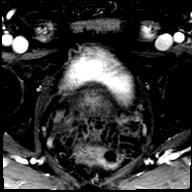
[im 487/600]
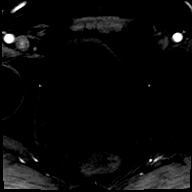
[im 525/600]
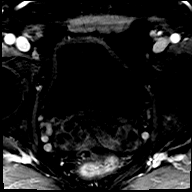
[im 562/600]
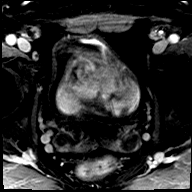
[im 600/600]
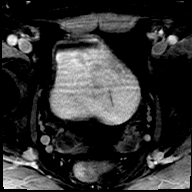

[Series 12: post t1_twist_tra_dyn-copy cent_sub · axial · 3.5mm · 0.83mm/px · z∈[-54,+12]mm · 17 of 579 slices shown]
[im 1/579]
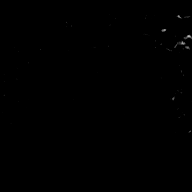
[im 37/579]
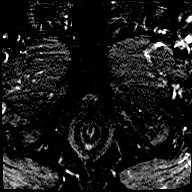
[im 73/579]
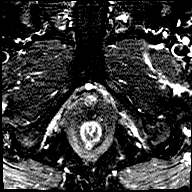
[im 109/579]
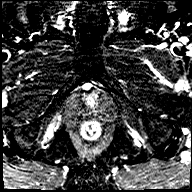
[im 145/579]
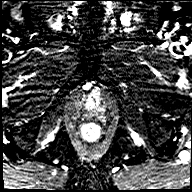
[im 181/579]
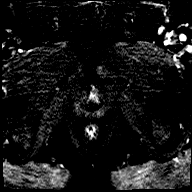
[im 217/579]
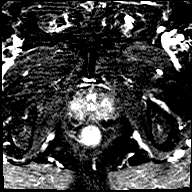
[im 253/579]
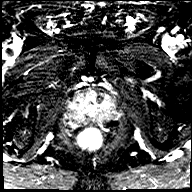
[im 290/579]
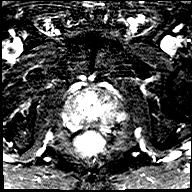
[im 326/579]
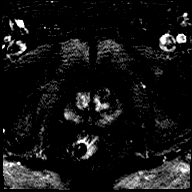
[im 362/579]
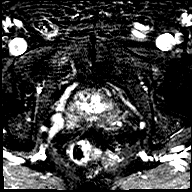
[im 398/579]
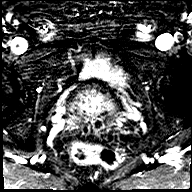
[im 434/579]
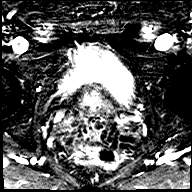
[im 470/579]
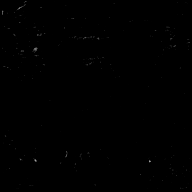
[im 506/579]
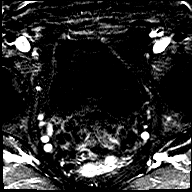
[im 542/579]
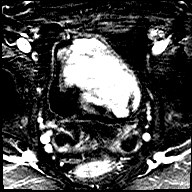
[im 579/579]
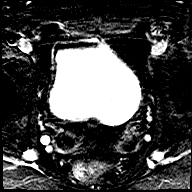

[Series 13: t1_vibe_dixon_tra_f · axial · 2.5mm · 0.91mm/px · z∈[-74,+123]mm · 2 of 80 slices shown]
[im 1/80]
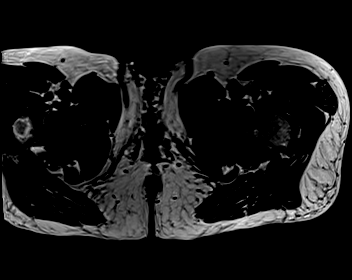
[im 80/80]
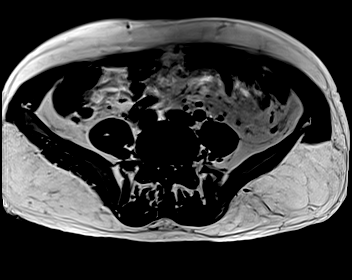

[Series 14: t1_vibe_dixon_tra_w · axial · 2.5mm · 0.91mm/px · z∈[-74,+123]mm · 2 of 80 slices shown]
[im 1/80]
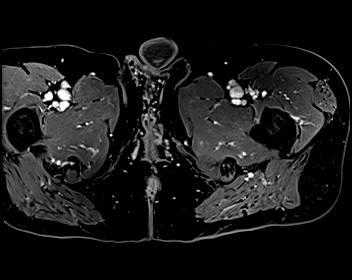
[im 80/80]
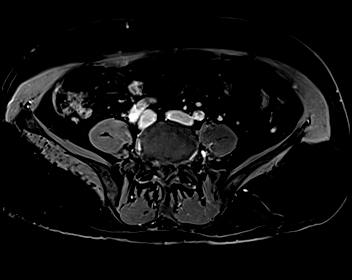

[48 of 48 positions shown; findings below may reference images not displayed]

FINDINGS: Prostate:

Region of interest # 1: PI-RADS category 4 lesion of the left
anterior peripheral zone and left anterior fibromuscular stroma at
the apex with focally reduced T2 signal, focal early enhancement,
and some mild restricted diffusion. This measures 0.37 cc (1.2 by
0.7 by 0.9 cm) and is shown for example on image 52 series 9 and
image 135 series 12.

Ill-defined low T2 signal scattered throughout the peripheral zone
of the prostate gland in a nonfocal manner, probably
postinflammatory and considered PI-RADS category 2.

Volume: 3D volumetric analysis: Prostate volume 41.01 cc (5.1 by
by 4.5 cm).

Transcapsular spread:  Absent

Seminal vesicle involvement: Absent

Neurovascular bundle involvement: Absent

Pelvic adenopathy: Absent

Bone metastasis: Absent

Other findings: Sigmoid colon diverticulosis.
IMPRESSION: 1. PI-RADS category 4 lesion of the left anterior peripheral zone
and adjacent left anterior fibromuscular stroma at the apex.
Targeting data sent to [facility].
2. Nonfocal scattered low T2 signal throughout the peripheral zone
of the prostate gland, probably postinflammatory and considered
PI-RADS category 2.
3. Sigmoid colon diverticulosis.

## 2023-08-11 ENCOUNTER — Encounter: Payer: Self-pay | Admitting: Family Medicine

## 2023-08-14 NOTE — Telephone Encounter (Unsigned)
 Copied from CRM [id number]. Topic: Clinical - Lab/Test Results >> Aug 11, 2023  3:04 PM Gurney Maxin H wrote: Reason for CRM: Ruby calling to notify clinic that patients report is in system and ready for review

## 2023-08-15 ENCOUNTER — Other Ambulatory Visit: Payer: Self-pay

## 2023-08-15 MED ORDER — ROSUVASTATIN CALCIUM 5 MG PO TABS: 5.0000 mg | ORAL_TABLET | Freq: Every day | ORAL | 3 refills | Status: DC

## 2023-08-23 ENCOUNTER — Other Ambulatory Visit: Payer: Self-pay

## 2023-08-23 DIAGNOSIS — E785 Hyperlipidemia, unspecified: Secondary | ICD-10-CM

## 2023-09-12 ENCOUNTER — Other Ambulatory Visit: Payer: Self-pay | Admitting: Family Medicine

## 2023-09-14 ENCOUNTER — Other Ambulatory Visit: Payer: Self-pay | Admitting: Family Medicine

## 2023-11-03 ENCOUNTER — Ambulatory Visit: Payer: Medicare Other | Admitting: Cardiovascular Disease

## 2024-01-03 NOTE — Progress Notes (Unsigned)
  Cardiology Office Note   Date:  01/04/2024  ID:  KADARIOUS DIKES, DOB Oct 18, 1955, MRN [id number] PCP: Almira Jaeger, MD  [facility] Providers Cardiologist:  None     January 04, 2024  Tyler [name] is a 68 y.o. male with hx of HTN,  HLD Had a coronary calcium  scan   CAC score is 344 ( 69th percentile for age and sex matched controls )   Father died at age 16 - presumably from MI  Retired as an Sales executive ,    Diplomatic Services operational officer , works out at [employer], walks , Film/video editor fairly well .   Hx of prostatectomy several years ago   ROS:   Studies Reviewed EKG Interpretation Date/Time:  Thursday January 04 2024 10:07:04 EDT Ventricular Rate:  77 PR Interval:  150 QRS Duration:  86 QT Interval:  368 QTC Calculation: 416 R Axis:   31  Text Interpretation: Normal sinus rhythm Normal ECG When compared with ECG of 10-Feb-2023 10:03, No significant change was found Confirmed by Ahmad Alert ([id number]) on 01/04/2024 10:12:47 AM  EKG Interpretation Date/Time:  Thursday January 04 2024 10:07:04 EDT Ventricular Rate:  77 PR Interval:  150 QRS Duration:  86 QT Interval:  368 QTC Calculation: 416 R Axis:   31  Text Interpretation: Normal sinus rhythm Normal ECG When compared with ECG of 10-Feb-2023 10:03, No significant change was found Confirmed by Ahmad Alert ([id number]) on 01/04/2024 10:12:47 AM   Risk Assessment/Calculations           Physical Exam VS:  BP 135/78   Pulse 77   Ht 5\' 7"  (1.702 m)   Wt 157 lb (71.2 kg)   SpO2 95%   BMI 24.59 kg/m    Wt Readings from Last 3 Encounters:  01/04/24 157 lb (71.2 kg)  06/07/23 157 lb 9.6 oz (71.5 kg)  02/28/23 153 lb (69.4 kg)    GEN: Well nourished, well developed in no acute distress NECK: No JVD; No carotid bruits CARDIAC: RRR, no murmurs, rubs, gallops RESPIRATORY:  Clear to auscultation without rales, wheezing or rhonchi  ABDOMEN: Soft, non-tender, non-distended EXTREMITIES:  No edema; No deformity   ASSESSMENT AND  PLAN    Coronary Artery calcification:   CAC score of 344.  His last LDL was 80s  I've advised him to work towards an LDL of 50s - 70s  We discussed symptoms of CAD   Continue cycling and other exercises  Follow up with Dr. Paulita Boss in 1 year            Dispo: 1 year    Signed, Ahmad Alert, MD

## 2024-01-04 ENCOUNTER — Ambulatory Visit: Attending: Cardiovascular Disease | Admitting: Cardiovascular Disease

## 2024-01-04 ENCOUNTER — Ambulatory Visit: Payer: Medicare Other

## 2024-01-04 ENCOUNTER — Encounter: Payer: Self-pay | Admitting: Cardiovascular Disease

## 2024-01-04 VITALS — BP 135/78 | HR 77 | Ht 67.0 in | Wt 157.0 lb

## 2024-01-04 DIAGNOSIS — E785 Hyperlipidemia, unspecified: Secondary | ICD-10-CM | POA: Diagnosis present

## 2024-01-04 DIAGNOSIS — I10 Essential (primary) hypertension: Secondary | ICD-10-CM | POA: Insufficient documentation

## 2024-01-04 NOTE — Patient Instructions (Signed)
 Medication Instructions:  Your physician recommends that you continue on your current medications as directed. Please refer to the Current Medication list given to you today.  *If you need a refill on your cardiac medications before your next appointment, please call your pharmacy*  Lab Work: None ordered.  If you have labs (blood work) drawn today and your tests are completely normal, you will receive your results only by: MyChart Message (if you have MyChart) OR A paper copy in the mail If you have any lab test that is abnormal or we need to change your treatment, we will call you to review the results.  Testing/Procedures: None ordered.   Follow-Up: At [facility], you and your health needs are our priority.  As part of our continuing mission to provide you with exceptional heart care, our providers are all part of one team.  This team includes your primary Cardiologist (physician) and Advanced Practice Providers or APPs (Physician Assistants and Nurse Practitioners) who all work together to provide you with the care you need, when you need it.  Your next appointment:   12 months with Dr Paulita Boss

## 2024-01-11 ENCOUNTER — Encounter: Payer: Self-pay | Admitting: Family Medicine

## 2024-01-11 ENCOUNTER — Ambulatory Visit

## 2024-01-11 ENCOUNTER — Ambulatory Visit (INDEPENDENT_AMBULATORY_CARE_PROVIDER_SITE_OTHER): Payer: Medicare Other | Admitting: Family Medicine

## 2024-01-11 VITALS — BP 120/70 | HR 68 | Temp 97.3°F | Ht 67.0 in | Wt 155.6 lb

## 2024-01-11 DIAGNOSIS — R053 Chronic cough: Secondary | ICD-10-CM

## 2024-01-11 DIAGNOSIS — I10 Essential (primary) hypertension: Secondary | ICD-10-CM

## 2024-01-11 DIAGNOSIS — E538 Deficiency of other specified B group vitamins: Secondary | ICD-10-CM

## 2024-01-11 DIAGNOSIS — Z131 Encounter for screening for diabetes mellitus: Secondary | ICD-10-CM

## 2024-01-11 DIAGNOSIS — E785 Hyperlipidemia, unspecified: Secondary | ICD-10-CM | POA: Diagnosis not present

## 2024-01-11 DIAGNOSIS — K219 Gastro-esophageal reflux disease without esophagitis: Secondary | ICD-10-CM | POA: Diagnosis not present

## 2024-01-11 DIAGNOSIS — R739 Hyperglycemia, unspecified: Secondary | ICD-10-CM

## 2024-01-11 MED ORDER — BENZONATATE 100 MG PO CAPS: 100.0000 mg | ORAL_CAPSULE | Freq: Three times a day (TID) | ORAL | 0 refills | Status: AC | PRN

## 2024-01-11 NOTE — Progress Notes (Signed)
 Phone [phone] In person visit   Subjective:   Tyler [name] is a 68 y.o. year old very pleasant male patient who presents for/with See problem oriented charting Chief Complaint  Patient presents with   Annual Exam    Not fasting. Denies issues/ROS.   Hypertension   Gastroesophageal Reflux   Allergies    Past Medical History-  Patient Active Problem List   Diagnosis Date Noted   History of prostate cancer 08/13/2021    Priority: High   Palpitations 11/22/2017    Priority: Medium    Memory difficulties 11/22/2017    Priority: Medium    Essential hypertension 11/21/2014    Priority: Medium    BPH (benign prostatic hyperplasia) 11/21/2014    Priority: Medium    Irritable bowel syndrome 04/17/2008    Priority: Medium    Anxiety state 12/27/2007    Priority: Medium    Hyperlipidemia 10/26/2007    Priority: Medium    Allergic rhinitis 11/21/2014    Priority: Low   Former smoker 11/21/2014    Priority: Low   Plantar fasciitis of left foot 10/22/2014    Priority: Low   Metatarsalgia of both feet 09/26/2014    Priority: Low   Hematuria 10/05/2013    Priority: Low   External hemorrhoids 04/17/2008    Priority: Low   History of colonic polyps 04/16/2008    Priority: Low   Asthma 12/27/2007    Priority: Low   GERD 12/27/2007    Priority: Low   Rosacea 10/26/2007    Priority: Low   COVID-19 08/12/2022   Genetic testing 08/23/2021   Family history of breast cancer 08/13/2021   Family history of prostate cancer 08/13/2021    Medications- reviewed and updated Current Outpatient Medications  Medication Sig Dispense Refill   albuterol  (VENTOLIN  HFA) 108 (90 Base) MCG/ACT inhaler Inhale 2 puffs into the lungs every 6 (six) hours as needed for wheezing or shortness of breath. 8 g 0   ALPRAZolam  (XANAX ) 0.25 MG tablet Take 1 tablet (0.25 mg total) by mouth 2 (two) times daily as needed for anxiety (do not drive for 6-8 hours after taking). 30 tablet 2    bisoprolol  (ZEBETA ) 5 MG tablet TAKE 1 TABLET BY MOUTH DAILY 45 tablet 3   busPIRone  (BUSPAR ) 5 MG tablet TAKE 1 TABLET BY MOUTH 3 TIMES A DAY 90 tablet 5   diphenhydramine -acetaminophen  (TYLENOL  PM) 25-500 MG TABS tablet Take 1-1.5 tablets by mouth at bedtime.     docusate sodium  (COLACE) 100 MG capsule Take 300 mg by mouth at bedtime.     FIBER PO Take 3 tablets by mouth daily.     Glycerin, PF, (OPTASE COMFORT DRY EYE) 1 % SOLN Place 1 drop into both eyes daily as needed (dry eyes).     mometasone (ELOCON) 0.1 % cream Apply 1 Application topically daily as needed (eczema).     omeprazole  (PRILOSEC) 40 MG capsule TAKE 1 CAPSULE BY MOUTH DAILY 90 capsule 0   Pramoxine-HC (HYDROCORTISONE  ACE-PRAMOXINE) 2.5-1 % CREA Apply 1 Application topically 2 (two) times daily as needed. 30 g 2   Probiotic Product (PROBIOTIC PO) Take 2 capsules by mouth daily.     rosuvastatin  (CRESTOR ) 5 MG tablet Take 1 tablet (5 mg total) by mouth daily. 90 tablet 3   No current facility-administered medications for this visit.     Objective:  BP 120/70   Pulse 68   Temp (!) 97.3 F (36.3 C)   Ht 5' 7 (1.702  m)   Wt 155 lb 9.6 oz (70.6 kg)   SpO2 98%   BMI 24.37 kg/m  Gen: NAD, resting comfortably Tympanic membrane normal, oroprhaynx largely normal other than some posterior drainage, nasal turbinates erythematous and edematous, sinuses nontender CV: RRR no murmurs rubs or gallops Lungs: CTAB no crackles, wheeze, rhonchi Abdomen: soft/nontender/nondistended/normal bowel sounds. No rebound or guarding.  Ext: no edema Skin: warm, dry Neuro: grossly normal, moves all extremities    Assessment and Plan   # Social update-retired October 2025- cycle on weekends, pickle ball, time with grandkids, volunteering  # Cough/congestion S:over 6 weeks of cough and congestion. Finished a 45 day course of xyzal and just switched to allegra .  3- 4 weeks ago and did a round of low dose prednisone  10 mg taper pack and  didn't note much improvement.  -no shortness of breath- but did have a biking buddy mention he seemed winded - some wheeze at times- albuterol  has not helped much - also getting runny nose. A lot of phlegm- not much pressure, clearing throat.  A/P: 6 weeks of cough/congestion at least with failure on home course of prednisone  to improve- at this point with former smoking history although distant - will get x-ray to rule out mass but also look for walking/atypical pneumonia. With his nasal turbinate apperaance could consider Augmentin for possible lower grade chronic sinusitis   #Prostate cancer-sees Dr. Dulcy Gibney S: radical prostatectomy February 2023 after watchful waiting since 2023.  - getting consult with DrJarvis Mesa for incontinence and possible male sling -PSA at undetectable levels in 2024 -male bladder sling July 2024- imperfect but helpful. Doing class on pelvic health with [facility] later today A/P: reports that levels were undetectable I April- great news- continue every 6 months - also having some testicular pain on amitriptyline- thought related to sling  #hypertension S: medication: bisoprolol  2.5 mg  BP Readings from Last 3 Encounters:  01/11/24 120/70  01/04/24 135/78  06/07/23 (!) 134/91  A/P: well controlled continue current medications   #hyperlipidemia-coronary artery calcifications with calcium  scoring 344 on 06/27/2023- will see Dr. Paulita Boss  S: Medication: rosuvastatin  5 mg daily -no chest pain or shortness of breath  Lab Results  Component Value Date   CHOL 150 01/12/2023   HDL 44.40 01/12/2023   LDLCALC 87 01/12/2023   LDLDIRECT 152.5 11/18/2008   TRIG 94.0 01/12/2023   CHOLHDL 3 01/12/2023   A/P: nonobstructive CAD noted- continue current medications - discussed could do aspirin 81 mg if desired but cardiology but did not recommend - he may do every other day  # Anxiety S:Medication:  buspirone  5 mg three times daily with alprazolam  as backup- mainly  with flying Counseling: therapy weekly in 2024 then down to monthly A/P: reports doing much better after flare last visit- therapy very helpful- continue current medications    # GERD S:Medication:  omeprazole  40 mg- still belches with this. Rarely needs additional half dose at night -endoscopy November 2023 with reflux esophagitis and small hiatal hernia A/P: reasonable control- continue current medications    #constipation/hemorrhoids- uses colace and fiber supplement - has been ok lately and no hemorrhoids  #relative B12 deficiency - on daily B12 - check today- was in 200's before  #elevated glucose in psat but a1c has been ok- check again today  #Health maintenance  -cancer screening-  1.  Colon cancer screening-05/27/2016 with 10-year repeat 2.  Quit smoking 1975 so no regular lung cancer screening needed- but we are doing x-ray with  chronic cough 3.  Sees Dr. Rochelle Chu for skin cancer screening- annual in aug 4. Prostate see above  -immunizations- Prevnar 20 once feeling better and shingrix Immunization History  Administered Date(s) Administered   Fluad Quad(high Dose 65+) 06/01/2022, 03/02/2023   Influenza, High Dose Seasonal PF 08/04/2020   Influenza,inj,Quad PF,6+ Mos 04/22/2013, 08/18/2017, 04/27/2019   PFIZER(Purple Top)SARS-COV-2 Vaccination 08/22/2019, 09/11/2019, 05/14/2020, 02/19/2021   Pfizer Covid-19 Vaccine Bivalent Booster 42yrs & up 07/17/2021, 04/02/2023   Pneumococcal Polysaccharide-23 09/15/2020   Td 10/26/2007   Tdap 12/18/2017   Zoster Recombinant(Shingrix) 01/03/2005   Recommended follow up: Return in about 1 year (around 01/10/2025) for followup or sooner if needed.Schedule b4 you leave. Future Appointments  Date Time Provider Department Center  03/07/2024  1:00 PM [facility] ANNUAL WELLNESS VISIT 1 [facility]   Lab/Order associations: NOT fasting   ICD-10-CM   1. Chronic cough  R05.3 DG Chest 2 View    2. Essential hypertension  I10     3.  Hyperlipidemia, unspecified hyperlipidemia type  E78.5 CBC with Differential/Platelet    Comprehensive metabolic panel with GFR    Lipid panel    CANCELED: Comprehensive metabolic panel with GFR    CANCELED: CBC with Differential/Platelet    CANCELED: Lipid panel    4. Gastroesophageal reflux disease without esophagitis  K21.9     5. B12 deficiency  E53.8 Vitamin B12    CANCELED: Vitamin B12    6. Screening for diabetes mellitus  Z13.1 Hemoglobin A1c    CANCELED: Hemoglobin A1c    7. Hyperglycemia  R73.9 Hemoglobin A1c    CANCELED: Hemoglobin A1c      No orders of the defined types were placed in this encounter.   Return precautions advised.  Clarisa Crooked, MD

## 2024-01-11 NOTE — Patient Instructions (Addendum)
 Prevnar 20 once you are feeling better and shingrix (we can do Prevnar 20 here but shingrix needs to be at pharmacy)  Please stop by lab before you go AND x-ray before you leave If you have mychart- we will send your results within 3 business days of us  receiving them.  If you do not have mychart- we will call you about results within 5 business days of us  receiving them.  *please also note that you will see labs on mychart as soon as they post. I will later go in and write notes on them- will say notes from Dr. Arlene Ben   May take week to get x-ray results- try tessalon  in meantime   6 weeks of cough/congestion at least with failure on home course of prednisone  to improve- at this point with former smoking history although distant - will get x-ray to rule out mass but also look for walking/atypical pneumonia. With his nasal turbinate apperaance could consider Augmentin for possible lower grade chronic sinusitis   Recommended follow up: Return in about 1 year (around 01/10/2025) for followup or sooner if needed.Schedule b4 you leave.

## 2024-01-12 ENCOUNTER — Ambulatory Visit: Payer: Self-pay | Admitting: Family Medicine

## 2024-01-12 LAB — COMPREHENSIVE METABOLIC PANEL WITH GFR
AG Ratio: 2.4 (calc) (ref 1.0–2.5)
ALT: 12 U/L (ref 9–46)
AST: 14 U/L (ref 10–35)
Albumin: 4.6 g/dL (ref 3.6–5.1)
Alkaline phosphatase (APISO): 55 U/L (ref 35–144)
BUN/Creatinine Ratio: 25 (calc) — ABNORMAL HIGH (ref 6–22)
BUN: 28 mg/dL — ABNORMAL HIGH (ref 7–25)
CO2: 22 mmol/L (ref 20–32)
Calcium: 9.9 mg/dL (ref 8.6–10.3)
Chloride: 105 mmol/L (ref 98–110)
Creat: 1.12 mg/dL (ref 0.70–1.35)
Globulin: 1.9 g/dL (ref 1.9–3.7)
Glucose, Bld: 97 mg/dL (ref 65–99)
Potassium: 4.6 mmol/L (ref 3.5–5.3)
Sodium: 140 mmol/L (ref 135–146)
Total Bilirubin: 0.5 mg/dL (ref 0.2–1.2)
Total Protein: 6.5 g/dL (ref 6.1–8.1)
eGFR: 71 mL/min/{1.73_m2} (ref >=60)

## 2024-01-12 LAB — CBC WITH DIFFERENTIAL/PLATELET
Absolute Lymphocytes: 2119 {cells}/uL (ref 850–3900)
Absolute Monocytes: 817 {cells}/uL (ref 200–950)
Basophils Absolute: 76 {cells}/uL (ref 0–200)
Basophils Relative: 0.8 %
Eosinophils Absolute: 247 {cells}/uL (ref 15–500)
Eosinophils Relative: 2.6 %
HCT: 51.9 % — ABNORMAL HIGH (ref 38.5–50.0)
Hemoglobin: 16.9 g/dL (ref 13.2–17.1)
MCH: 29.2 pg (ref 27.0–33.0)
MCHC: 32.6 g/dL (ref 32.0–36.0)
MCV: 89.6 fL (ref 80.0–100.0)
MPV: 9.9 fL (ref 7.5–12.5)
Monocytes Relative: 8.6 %
Neutro Abs: 6242 {cells}/uL (ref 1500–7800)
Neutrophils Relative %: 65.7 %
Platelets: 298 10*3/uL (ref 140–400)
RBC: 5.79 10*6/uL (ref 4.20–5.80)
RDW: 14.5 % (ref 11.0–15.0)
Total Lymphocyte: 22.3 %
WBC: 9.5 10*3/uL (ref 3.8–10.8)

## 2024-01-12 LAB — LIPID PANEL
Cholesterol: 123 mg/dL (ref <200)
HDL: 59 mg/dL (ref >=40)
LDL Cholesterol (Calc): 48 mg/dL
Non-HDL Cholesterol (Calc): 64 mg/dL (ref <130)
Total CHOL/HDL Ratio: 2.1 (calc) (ref <5.0)
Triglycerides: 82 mg/dL (ref <150)

## 2024-01-12 LAB — HEMOGLOBIN A1C
Hgb A1c MFr Bld: 5.6 % (ref <5.7)
Mean Plasma Glucose: 114 mg/dL
eAG (mmol/L): 6.3 mmol/L

## 2024-01-12 LAB — VITAMIN B12: Vitamin B-12: 731 pg/mL (ref 200–1100)

## 2024-01-17 MED ORDER — AZITHROMYCIN 250 MG PO TABS: ORAL_TABLET | ORAL | 0 refills | Status: AC

## 2024-03-07 ENCOUNTER — Ambulatory Visit

## 2024-03-07 VITALS — Ht 67.0 in | Wt 155.0 lb

## 2024-03-07 DIAGNOSIS — Z0000 Encounter for general adult medical examination without abnormal findings: Secondary | ICD-10-CM | POA: Diagnosis not present

## 2024-03-07 NOTE — Progress Notes (Signed)
 Subjective:   Tyler [name] is a 68 y.o. who presents for a Medicare Wellness preventive visit.  As a reminder, Annual Wellness Visits don't include a physical exam, and some assessments may be limited, especially if this visit is performed virtually. We may recommend an in-person follow-up visit with your provider if needed.  Visit Complete: Virtual I connected with  Tyler [name] on 03/07/24 by a video and audio enabled telemedicine application and verified that I am speaking with the correct person using two identifiers.  Patient Location: Home  Provider Location: Office/Clinic  I discussed the limitations of evaluation and management by telemedicine. The patient expressed understanding and agreed to proceed.  Vital Signs: Because this visit was a virtual/telehealth visit, some criteria may be missing or patient reported. Any vitals not documented were not able to be obtained and vitals that have been documented are patient reported.  VideoDeclined- This patient declined Librarian, academic. Therefore the visit was completed with audio only.  Persons Participating in Visit: Patient.  AWV Questionnaire: Yes: Patient Medicare AWV questionnaire was completed by the patient on 03/06/24; I have confirmed that all information answered by patient is correct and no changes since this date.        Objective:    Today's Vitals   03/07/24 1400  Weight: 155 lb (70.3 kg)  Height: 5' 7 (1.702 m)   Body mass index is 24.28 kg/m.     03/07/2024    2:03 PM 02/21/2023    8:20 AM 02/10/2023   10:12 AM 12/29/2022   11:48 AM 12/23/2021   11:58 AM 09/23/2021    4:00 PM 09/07/2021    9:12 AM  Advanced Directives  Does Patient Have a Medical Advance Directive? No Yes Yes No Yes Yes No  Type of Special educational needs teacher of [place];Living will Healthcare Power of [place];Living will  Healthcare Power of Attorney Living will   Does patient want to make  changes to medical advance directive?  No - Patient declined    No - Patient declined   Copy of Healthcare Power of Attorney in Chart?  No - copy requested   No - copy requested    Would patient like information on creating a medical advance directive? No - Patient declined   No - Patient declined       Current Medications (verified) Outpatient Encounter Medications as of 03/07/2024  Medication Sig   albuterol  (VENTOLIN  HFA) 108 (90 Base) MCG/ACT inhaler Inhale 2 puffs into the lungs every 6 (six) hours as needed for wheezing or shortness of breath.   ALPRAZolam  (XANAX ) 0.25 MG tablet Take 1 tablet (0.25 mg total) by mouth 2 (two) times daily as needed for anxiety (do not drive for 6-8 hours after taking).   amitriptyline (ELAVIL) 25 MG tablet Take 25 mg by mouth at bedtime.   benzonatate  (TESSALON ) 100 MG capsule Take 1 capsule (100 mg total) by mouth 3 (three) times daily as needed for cough.   bisoprolol  (ZEBETA ) 5 MG tablet TAKE 1 TABLET BY MOUTH DAILY   busPIRone  (BUSPAR ) 5 MG tablet TAKE 1 TABLET BY MOUTH 3 TIMES A DAY   diphenhydramine -acetaminophen  (TYLENOL  PM) 25-500 MG TABS tablet Take 1-1.5 tablets by mouth at bedtime.   docusate sodium  (COLACE) 100 MG capsule Take 300 mg by mouth at bedtime.   FIBER PO Take 3 tablets by mouth daily.   Glycerin, PF, (OPTASE COMFORT DRY EYE) 1 % SOLN Place 1 drop into both  eyes daily as needed (dry eyes).   meloxicam (MOBIC) 15 MG tablet Take 15 mg by mouth as needed for pain.   mometasone (ELOCON) 0.1 % cream Apply 1 Application topically daily as needed (eczema).   omeprazole  (PRILOSEC) 40 MG capsule TAKE 1 CAPSULE BY MOUTH DAILY   Pramoxine-HC (HYDROCORTISONE  ACE-PRAMOXINE) 2.5-1 % CREA Apply 1 Application topically 2 (two) times daily as needed.   Probiotic Product (PROBIOTIC PO) Take 2 capsules by mouth daily.   rosuvastatin  (CRESTOR ) 5 MG tablet Take 1 tablet (5 mg total) by mouth daily.   No facility-administered encounter medications on file  as of 03/07/2024.    Allergies (verified) Penicillins   History: Past Medical History:  Diagnosis Date   Allergy    Anxiety    Arthritis    Asthma    Cancer (HCC)    prostate cancer   Cataract    beginning   Colon polyps    Depression    GERD (gastroesophageal reflux disease)    Hematuria 10/05/2013   Hemorrhoids    Hyperlipidemia    Hypertension    IBS (irritable bowel syndrome)    alt diarrhea/ constipation   PONV (postoperative nausea and vomiting)    Rosacea    Past Surgical History:  Procedure Laterality Date   cataract surgery      COLONOSCOPY  07/28/2006   LYMPH NODE DISSECTION Bilateral 09/23/2021   Procedure: PELVIC LYMPH NODE DISSECTION;  Surgeon: Cam Morene ORN, MD;  Location: [facility];  Service: Urology;  Laterality: Bilateral;   ROBOT ASSISTED LAPAROSCOPIC RADICAL PROSTATECTOMY N/A 09/23/2021   Procedure: XI ROBOTIC ASSISTED LAPAROSCOPIC RADICAL PROSTATECTOMY;  Surgeon: Cam Morene ORN, MD;  Location: [facility];  Service: Urology;  Laterality: N/A;  4.5 HRS   URETHRAL SLING N/A 02/21/2023   Procedure: INSERTION OF MALE SLING;  Surgeon: Lovie Arlyss CROME, MD;  Location: [facility];  Service: Urology;  Laterality: N/A;  90 MINUTES NEEDED FOR CASE   Family History  Problem Relation Age of Onset   Glaucoma Mother        and father   Coronary artery disease Mother        late 26s-cabg. died at 45   Breast cancer Mother        dx. 60s   Coronary artery disease Father 19       death 74, presumed due to MI as sudden   Stomach cancer Maternal Uncle    Breast cancer Paternal Aunt    Prostate cancer Paternal Grandfather        dx. 80s   Colon cancer Neg Hx    Colon polyps Neg Hx    Esophageal cancer Neg Hx    Rectal cancer Neg Hx    Social History   Socioeconomic History   Marital status: Married    Spouse name: Not on file   Number of children: Not on file   Years of education: Not on file   Highest education level: Bachelor's degree (e.g., BA, AB, BS)   Occupational History   Not on file  Tobacco Use   Smoking status: Former    Current packs/day: 0.00    Average packs/day: 0.8 packs/day for 4.0 years (3.0 ttl pk-yrs)    Types: Cigarettes    Start date: 08/01/1969    Quit date: 08/01/1973    Years since quitting: 50.6   Smokeless tobacco: Never  Vaping Use   Vaping status: Never Used  Substance and Sexual Activity   Alcohol use: Yes  Alcohol/week: 0.0 - 3.0 standard drinks of alcohol    Comment: social   Drug use: No   Sexual activity: Not on file  Other Topics Concern   Not on file  Social History Narrative   Married (wife plans to transition to me 2020, see mother in law as well Leeroy Able), 3 kids. 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 1 of his children is adopted.       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       Hobbies: biking, cycling, reading, movies   Social Drivers of Health   Financial Resource Strain: Low Risk  (03/06/2024)   Overall Financial Resource Strain (CARDIA)    Difficulty of Paying Living Expenses: Not hard at all  Food Insecurity: No Food Insecurity (03/06/2024)   Hunger Vital Sign    Worried About Running Out of Food in the Last Year: Never true    Ran Out of Food in the Last Year: Never true  Transportation Needs: No Transportation Needs (03/06/2024)   PRAPARE - Administrator, Civil Service (Medical): No    Lack of Transportation (Non-Medical): No  Physical Activity: Sufficiently Active (03/06/2024)   Exercise Vital Sign    Days of Exercise per Week: 4 days    Minutes of Exercise per Session: 70 min  Stress: No Stress Concern Present (03/06/2024)   [employer] of Occupational Health - Occupational Stress Questionnaire    Feeling of Stress: Only a little  Recent Concern: Stress - Stress Concern Present (01/07/2024)   [employer]  of Occupational Health - Occupational Stress Questionnaire    Feeling of Stress : To some extent  Social Connections: Socially Integrated (03/06/2024)   Social Connection and Isolation Panel    Frequency of Communication with Friends and Family: More than three times a week    Frequency of Social Gatherings with Friends and Family: Three times a week    Attends Religious Services: More than 4 times per year    Active Member of Clubs or Organizations: Yes    Attends Engineer, structural: More than 4 times per year    Marital Status: Married    Tobacco Counseling Counseling given: Not Answered    Clinical Intake:  Pre-visit preparation completed: Yes  Pain : No/denies pain     BMI - recorded: 24.28 Nutritional Status: BMI of 19-24  Normal Nutritional Risks: None Diabetes: No  Lab Results  Component Value Date   HGBA1C 5.6 01/11/2024   HGBA1C 5.3 01/12/2023   HGBA1C 5.6 03/29/2021     How often do you need to have someone help you when you read instructions, pamphlets, or other written materials from your doctor or pharmacy?: 1 - Never  Interpreter Needed?: No  Information entered by :: Ellouise Haws, LPN   Activities of Daily Living     03/06/2024    4:55 PM  In your present state of health, do you have any difficulty performing the following activities:  Hearing? 0  Vision? 0  Difficulty concentrating or making decisions? 0  Walking or climbing stairs? 0  Dressing or bathing? 0  Doing errands, shopping? 0  Preparing Food and eating ? N  Using the Toilet? N  In the past six months, have you accidently leaked urine? Y  Do you have problems with loss of bowel control? N  Managing your Medications? N  Managing your Finances? N  Housekeeping or managing your Housekeeping? N    Patient Care Team: Katrinka Garnette KIDD, MD as PCP - General (Family Medicine) Cam Morene ORN, MD as Attending Physician (Urology)  I have updated your Care Teams any recent  Medical Services you may have received from other providers in the past year.     Assessment:   This is a routine wellness examination for Tyler [name].  Hearing/Vision screen Hearing Screening - Comments:: Pt denies any hearing issues  Vision Screening - Comments:: Wears rx glasses - up to date with routine eye exams with Dr Lita    Goals Addressed             This Visit's Progress    Patient Stated       Maintain health and activity        Depression Screen     03/07/2024    2:04 PM 01/12/2023    9:19 AM 12/29/2022   11:47 AM 08/12/2022    4:56 PM 12/23/2021   11:57 AM 03/29/2021    3:30 PM 09/15/2020    8:34 AM  PHQ 2/9 Scores  PHQ - 2 Score 0 0 0 0 0 1 0  PHQ- 9 Score  2  0  5 0    Fall Risk     03/06/2024    4:55 PM 01/12/2023    9:19 AM 12/29/2022   11:49 AM 08/12/2022    4:55 PM 06/16/2022    3:46 PM  Fall Risk   Falls in the past year? 0 0 0 0 0  Number falls in past yr:  0 0 0 0  Injury with Fall?  0 0 0 0  Risk for fall due to : No Fall Risks No Fall Risks Impaired vision No Fall Risks No Fall Risks  Follow up Falls prevention discussed Falls evaluation completed Falls prevention discussed Falls evaluation completed  Falls evaluation completed      Data saved with a previous flowsheet row definition    MEDICARE RISK AT HOME:  Medicare Risk at Home Any stairs in or around the home?: (Patient-Rptd) No If so, are there any without handrails?: (Patient-Rptd) No Home free of loose throw rugs in walkways, pet beds, electrical cords, etc?: (Patient-Rptd) Yes Adequate lighting in your home to reduce risk of falls?: (Patient-Rptd) Yes Life alert?: (Patient-Rptd) No Use of a cane, walker or w/c?: (Patient-Rptd) No Grab bars in the bathroom?: (Patient-Rptd) Yes Shower chair or bench in shower?: (Patient-Rptd) Yes Elevated toilet seat or a handicapped toilet?: (Patient-Rptd) Yes  TIMED UP AND GO:  Was the test performed?  No  Cognitive Function: 6CIT completed         03/07/2024    2:05 PM 12/29/2022   11:50 AM 12/23/2021   12:01 PM  6CIT Screen  What Year? 0 points 0 points 0 points  What month? 0 points 0 points 0 points  What time? 0 points 0 points 0 points  Count back from 20 0 points 0 points 0 points  Months in reverse 0 points 0 points 0 points  Repeat phrase 0 points 0 points 0 points  Total Score 0 points 0 points 0 points    Immunizations Immunization History  Administered Date(s) Administered   Fluad Quad(high Dose 65+) 06/01/2022, 03/02/2023   Influenza, High Dose Seasonal PF 08/04/2020   Influenza,inj,Quad PF,6+ Mos 04/22/2013, 08/18/2017, 04/27/2019   PFIZER(Purple Top)SARS-COV-2 Vaccination 08/22/2019, 09/11/2019, 05/14/2020, 02/19/2021   Pfizer  Covid-19 Vaccine Bivalent Booster 85yrs & up 07/17/2021, 04/02/2023   Pneumococcal Polysaccharide-23 09/15/2020   Td 10/26/2007   Tdap 12/18/2017   Zoster Recombinant(Shingrix) 01/03/2005    Screening Tests Health Maintenance  Topic Date Due   COVID-19 Vaccine (7 - 2024-25 season) 05/28/2023   INFLUENZA VACCINE  03/01/2024   Zoster Vaccines- Shingrix (2 of 2) 04/12/2024 (Originally 02/28/2005)   Pneumococcal Vaccine: 50+ Years (2 of 2 - PCV) 01/10/2025 (Originally 09/15/2021)   Medicare Annual Wellness (AWV)  03/07/2025   Colonoscopy  05/27/2026   DTaP/Tdap/Td (3 - Td or Tdap) 12/19/2027   Hepatitis C Screening  Completed   Hepatitis B Vaccines  Aged Out   HPV VACCINES  Aged Out   Meningococcal B Vaccine  Aged Out    Health Maintenance  Health Maintenance Due  Topic Date Due   COVID-19 Vaccine (7 - 2024-25 season) 05/28/2023   INFLUENZA VACCINE  03/01/2024   Health Maintenance Items Addressed: See Nurse Notes at the end of this note  Additional Screening:  Vision Screening: Recommended annual ophthalmology exams for early detection of glaucoma and other disorders of the eye. Would you like a referral to an eye doctor? No    Dental Screening: Recommended annual  dental exams for proper oral hygiene  Community Resource Referral / Chronic Care Management: CRR required this visit?  No   CCM required this visit?  No   Plan:    I have personally reviewed and noted the following in the patient's chart:   Medical and social history Use of alcohol, tobacco or illicit drugs  Current medications and supplements including opioid prescriptions. Patient is not currently taking opioid prescriptions. Functional ability and status Nutritional status Physical activity Advanced directives List of other physicians Hospitalizations, surgeries, and ER visits in previous 12 months Vitals Screenings to include cognitive, depression, and falls Referrals and appointments  In addition, I have reviewed and discussed with patient certain preventive protocols, quality metrics, and best practice recommendations. A written personalized care plan for preventive services as well as general preventive health recommendations were provided to patient.   Ellouise VEAR Haws, LPN   08/02/7972   After Visit Summary: (MyChart) Due to this being a telephonic visit, the after visit summary with patients personalized plan was offered to patient via MyChart   Notes: Nothing significant to report at this time.

## 2024-03-07 NOTE — Patient Instructions (Signed)
 Mr. Harrower , Thank you for taking time out of your busy schedule to complete your Annual Wellness Visit with me. I enjoyed our conversation and look forward to speaking with you again next year. I, as well as your care team,  appreciate your ongoing commitment to your health goals. Please review the following plan we discussed and let me know if I can assist you in the future. Your Game plan/ To Do List    Referrals: If you haven't heard from the office you've been referred to, please reach out to them at the phone provided.   Follow up Visits: We will see or speak with you next year for your Next Medicare AWV with our clinical staff Have you seen your provider in the last 6 months (3 months if uncontrolled diabetes)? Yes  Clinician Recommendations:  Aim for 30 minutes of exercise or brisk walking, 6-8 glasses of water , and 5 servings of fruits and vegetables each day.       This is a list of the screenings recommended for you:  Health Maintenance  Topic Date Due   COVID-19 Vaccine (7 - 2024-25 season) 05/28/2023   Flu Shot  03/01/2024   Zoster (Shingles) Vaccine (2 of 2) 04/12/2024*   Pneumococcal Vaccine for age over 15 (2 of 2 - PCV) 01/10/2025*   Medicare Annual Wellness Visit  03/07/2025   Colon Cancer Screening  05/27/2026   DTaP/Tdap/Td vaccine (3 - Td or Tdap) 12/19/2027   Hepatitis C Screening  Completed   Hepatitis B Vaccine  Aged Out   HPV Vaccine  Aged Out   Meningitis B Vaccine  Aged Out  *Topic was postponed. The date shown is not the original due date.    Advanced directives: (Declined) Advance directive discussed with you today. Even though you declined this today, please call our office should you change your mind, and we can give you the proper paperwork for you to fill out. Advance Care Planning is important because it:  [x]  Makes sure you receive the medical care that is consistent with your values, goals, and preferences  [x]  It provides guidance to your  family and loved ones and reduces their decisional burden about whether or not they are making the right decisions based on your wishes.  Follow the link provided in your after visit summary or read over the paperwork we have mailed to you to help you started getting your Advance Directives in place. If you need assistance in completing these, please reach out to us  so that we can help you!  See attachments for Preventive Care and Fall Prevention Tips.

## 2024-03-23 ENCOUNTER — Other Ambulatory Visit: Payer: Self-pay | Admitting: Family Medicine

## 2024-04-09 ENCOUNTER — Telehealth: Payer: Self-pay

## 2024-04-09 NOTE — Telephone Encounter (Signed)
 Tyler [name], This is a previous Dr. Aneita patient that has an appt with you on 05/06/24. Can I refill his omeprazole  until his scheduled appt with you?

## 2024-04-09 NOTE — Telephone Encounter (Signed)
 Received a call from patient Tyler [name] pt) regarding a medication refill he needed for Omeprazole  40 mg, Patient scheduled for 10/6. Please review and advise  Thank you

## 2024-04-10 ENCOUNTER — Other Ambulatory Visit: Payer: Self-pay | Admitting: Family Medicine

## 2024-04-10 MED ORDER — OMEPRAZOLE 40 MG PO CPDR: 40.0000 mg | DELAYED_RELEASE_CAPSULE | Freq: Every day | ORAL | 0 refills | Status: DC

## 2024-05-01 ENCOUNTER — Other Ambulatory Visit: Payer: Self-pay

## 2024-05-01 DIAGNOSIS — C641 Malignant neoplasm of right kidney, except renal pelvis: Secondary | ICD-10-CM

## 2024-05-01 DIAGNOSIS — C651 Malignant neoplasm of right renal pelvis: Secondary | ICD-10-CM

## 2024-05-03 NOTE — Progress Notes (Addendum)
 05/06/2024 Tyler [name] [id number] 07-Jul-1956  Referring provider: Katrinka Garnette KIDD, MD Primary GI doctor: Dr. Suzann (Dr. Aneita)  ASSESSMENT AND PLAN:  Dysphagia with history of GERD/esophagitis/dilation October 2023 05/16/2022 barium swallow small GERD, presbyesophagus and stasis 05/26/2022 EGD LA grade a esophagitis benign-appearing stenosis status post dilation, small HH gastritis unremarkable duodenum.  Path showed vascular ectasia, no H. pylori or metaplasia Nocturnal GERD, beltching no bloating Denies dysphagia, melena Was on mobic/tramadol  due to back pain, has not been on it for 4-5 days likely contributing to symptoms -On prilosec 40 mg daily, add on pepcid at night - FDGard given -alginate therapy given -Lifestyle changes discussed, avoid NSAIDS, ETOH, hand out given to the patient -consider EGD if any worsening symptoms  IBS-mixed Intermittent diarrhea/constipation Possible pelvic floor with history of proctectomy Pending CT AB and pelvis for RCC Add on miralax/fiber, consider linzess Consider referral back to pelvic floor Recall colon 2027  Right renal cell carcinoma Following with Dr. Cam at [facility] Likely with have to have nephrectomy Pending CT  Personal history of colon polyps 05/27/2016 colonoscopy internal hemorrhoids otherwise unremarkable Recall 05/2026  History of prostate cancer Status post prostatectomy 09/23/2021  CAD 06/27/2023 cardiac coronary score 344 LAD and RCA Medical management  I have reviewed the clinic note as outlined by Tyler Coombs, PA and agree with the assessment, plan and medical decision making.  Tyler [name] returns to the office today for follow-up of GERD, dysphagia, and hernia -last EGD in 2023 with dilation.  Has some breakthrough symptoms of GERD.  Agree with continuing Prilosec and adding Pepcid at night.  Can consider EGD if conservative measures are not beneficial.  Endorses intermittent  constipation and diarrhea.  Already scheduled for CT of the abdomen to follow-up renal cell carcinoma.  This will provide an opportunity to assess bowel loops.  Consider pelvic floor physical therapy.  Tyler Suzann, MD   Patient Care Team: Tyler Garnette KIDD, MD as PCP - General (Family Medicine) Tyler Morene ORN, MD as Attending Physician (Urology)  HISTORY OF PRESENT ILLNESS: 68 y.o. male with a past medical history listed below presents for evaluation of GERD, beltching, IBS.   Discussed the use of AI scribe software for clinical note transcription with the patient, who gave verbal consent to proceed.  History of Present Illness   Tyler [name] is a 68 year old male with gastroesophageal reflux disease who presents with worsening reflux symptoms.  He has a history of gastroesophageal reflux disease and underwent esophageal dilation in October 2023 due to dysphagia and heartburn. He currently takes omeprazole  40 mg once daily and occasionally takes an additional dose at night for nocturnal reflux symptoms. Reflux primarily occurs when lying down, and he sometimes uses Gaviscon for relief. Certain foods, such as casseroles and fried onions, exacerbate his symptoms.  He has a history of a small hiatal hernia and was told there was inflammation found during an endoscopy in 2023. He denies current dysphagia, food or pills getting stuck, or hematochezia. He does not experience significant gas or belching. No significant bloating, but belching is a primary issue.  He experiences alternating constipation and diarrhea, for which he takes a stool softener and fiber nightly, and occasionally uses Miralax. This pattern has been ongoing for a long time, and he is unsure if it worsened after his prostatectomy in 2023. He has participated in pelvic floor physical therapy and a pelvic strengthening class, which he found beneficial.  He has a history  of back and leg pain, for which he was taking  meloxicam and tramadol . He stopped tramadol  four to five days ago. A recent MRI for back pain revealed a mass on his left kidney, and he is awaiting a CT scan. He is in contact with his urologist regarding this issue.  No shortness of breath, chest discomfort, significant abdominal discomfort, or weight loss. He occasionally drinks a Coke every three days and denies significant dietary triggers for his symptoms.       He  reports that he quit smoking about 50 years ago. His smoking use included cigarettes. He started smoking about 54 years ago. He has a 3 pack-year smoking history. He has never used smokeless tobacco. He reports current alcohol use. He reports that he does not use drugs.  RELEVANT GI HISTORY, IMAGING AND LABS: Results   DIAGNOSTIC Colonoscopy: Internal hemorrhoids (05/2016) Esophageal dilation: Performed (05/2022) Endoscopy: Small hiatal hernia and inflammation (2023)      CBC    Component Value Date/Time   WBC 9.5 01/11/2024 1013   RBC 5.79 01/11/2024 1013   HGB 16.9 01/11/2024 1013   HCT 51.9 (H) 01/11/2024 1013   PLT 298 01/11/2024 1013   MCV 89.6 01/11/2024 1013   MCH 29.2 01/11/2024 1013   MCHC 32.6 01/11/2024 1013   RDW 14.5 01/11/2024 1013   LYMPHSABS 1.7 01/12/2023 1014   MONOABS 0.6 01/12/2023 1014   EOSABS 247 01/11/2024 1013   BASOSABS 76 01/11/2024 1013   Recent Labs    01/11/24 1013  HGB 16.9    CMP     Component Value Date/Time   NA 140 01/11/2024 1013   K 4.6 01/11/2024 1013   CL 105 01/11/2024 1013   CO2 22 01/11/2024 1013   GLUCOSE 97 01/11/2024 1013   GLUCOSE 97 06/19/2006 0815   BUN 28 (H) 01/11/2024 1013   CREATININE 1.12 01/11/2024 1013   CALCIUM  9.9 01/11/2024 1013   PROT 6.5 01/11/2024 1013   ALBUMIN 4.5 01/12/2023 1014   AST 14 01/11/2024 1013   ALT 12 01/11/2024 1013   ALKPHOS 46 01/12/2023 1014   BILITOT 0.5 01/11/2024 1013   GFRNONAA >60 02/10/2023 1004   GFRAA 101 10/19/2007 0810      Latest Ref Rng & Units  01/11/2024   10:13 AM 01/12/2023   10:14 AM 01/11/2022   11:47 AM  Hepatic Function  Total Protein 6.1 - 8.1 g/dL 6.5  6.9  6.7   Albumin 3.5 - 5.2 g/dL  4.5  4.3   AST 10 - 35 U/L 14  14  12    ALT 9 - 46 U/L 12  11  9    Alk Phosphatase 39 - 117 U/L  46  48   Total Bilirubin 0.2 - 1.2 mg/dL 0.5  0.6  0.4       Current Medications:    Current Outpatient Medications (Cardiovascular):    bisoprolol  (ZEBETA ) 5 MG tablet, TAKE 1 TABLET BY MOUTH DAILY   rosuvastatin  (CRESTOR ) 5 MG tablet, Take 1 tablet (5 mg total) by mouth daily.  Current Outpatient Medications (Respiratory):    albuterol  (VENTOLIN  HFA) 108 (90 Base) MCG/ACT inhaler, Inhale 2 puffs into the lungs every 6 (six) hours as needed for wheezing or shortness of breath.   benzonatate  (TESSALON ) 100 MG capsule, Take 1 capsule (100 mg total) by mouth 3 (three) times daily as needed for cough.  Current Outpatient Medications (Analgesics):    meloxicam (MOBIC) 15 MG tablet, Take 15 mg by mouth  as needed for pain.   traMADol  (ULTRAM ) 50 MG tablet, Take 50 mg by mouth 4 (four) times daily as needed.   Current Outpatient Medications (Other):    ALPRAZolam  (XANAX ) 0.25 MG tablet, Take 1 tablet (0.25 mg total) by mouth 2 (two) times daily as needed for anxiety (do not drive for 6-8 hours after taking).   amitriptyline (ELAVIL) 25 MG tablet, Take 25 mg by mouth at bedtime.   busPIRone  (BUSPAR ) 5 MG tablet, TAKE 1 TABLET BY MOUTH 3 TIMES A DAY   diphenhydramine -acetaminophen  (TYLENOL  PM) 25-500 MG TABS tablet, Take 1-1.5 tablets by mouth at bedtime.   docusate sodium  (COLACE) 100 MG capsule, Take 300 mg by mouth at bedtime.   famotidine (PEPCID) 40 MG tablet, Take 1 tablet (40 mg total) by mouth at bedtime.   FIBER PO, Take 3 tablets by mouth daily.   Glycerin, PF, (OPTASE COMFORT DRY EYE) 1 % SOLN, Place 1 drop into both eyes daily as needed (dry eyes).   mometasone (ELOCON) 0.1 % cream, Apply 1 Application topically daily as needed  (eczema).   omeprazole  (PRILOSEC) 40 MG capsule, Take 1 capsule (40 mg total) by mouth daily.   Pramoxine-HC (HYDROCORTISONE  ACE-PRAMOXINE) 2.5-1 % CREA, Apply 1 Application topically 2 (two) times daily as needed.   Probiotic Product (PROBIOTIC PO), Take 2 capsules by mouth daily.  Medical History:  Past Medical History:  Diagnosis Date   Allergy    Anxiety    Arthritis    Asthma    Cancer (HCC)    prostate cancer   Cataract    beginning   Colon polyps    Depression    GERD (gastroesophageal reflux disease)    Hematuria 10/05/2013   Hemorrhoids    Hyperlipidemia    Hypertension    IBS (irritable bowel syndrome)    alt diarrhea/ constipation   Left kidney mass    PONV (postoperative nausea and vomiting)    Rosacea    Allergies:  Allergies  Allergen Reactions   Penicillins     Mouth sores     Surgical History:  He  has a past surgical history that includes Colonoscopy (07/28/2006); cataract surgery ; Robot assisted laparoscopic radical prostatectomy (N/A, 09/23/2021); Lymph node dissection (Bilateral, 09/23/2021); and Urethral sling (N/A, 02/21/2023). Family History:  His family history includes Breast cancer in his mother and paternal aunt; Coronary artery disease in his mother; Coronary artery disease (age of onset: 71) in his father; Glaucoma in his mother; Prostate cancer in his paternal grandfather; Stomach cancer in his maternal uncle.  REVIEW OF SYSTEMS  : All other systems reviewed and negative except where noted in the History of Present Illness.  PHYSICAL EXAM: BP 130/88 (BP Location: Left Arm, Patient Position: Sitting, Cuff Size: Normal)   Pulse 88   Ht 5' 5.75 (1.67 m) Comment: height measured without shoes  Wt 155 lb (70.3 kg)   BMI 25.21 kg/m  Physical Exam   GENERAL APPEARANCE: Well nourished, in no apparent distress HEENT: No cervical lymphadenopathy, unremarkable thyroid , sclerae anicteric, conjunctiva pink RESPIRATORY: Respiratory effort normal, BS  equal bilateral without rales, rhonchi, wheezing CARDIO: RRR with no MRGs, peripheral pulses intact ABDOMEN: Soft, non distended, active bowel sounds in all 4 quadrants, no tenderness to palpation, no rebound, no mass appreciated RECTAL: declines MUSCULOSKELETAL: Full ROM, normal gait, without edema SKIN: Dry, intact without rashes or lesions. No jaundice. NEURO: Alert, oriented, no focal deficits PSYCH: Cooperative, normal mood and affect.      Tyler  JONELLE Coombs, PA-C 9:57 AM

## 2024-05-06 ENCOUNTER — Ambulatory Visit: Admitting: Physician Assistant

## 2024-05-06 ENCOUNTER — Encounter: Payer: Self-pay | Admitting: Physician Assistant

## 2024-05-06 VITALS — BP 130/88 | HR 88 | Ht 65.75 in | Wt 155.0 lb

## 2024-05-06 DIAGNOSIS — R142 Eructation: Secondary | ICD-10-CM

## 2024-05-06 DIAGNOSIS — K219 Gastro-esophageal reflux disease without esophagitis: Secondary | ICD-10-CM | POA: Diagnosis not present

## 2024-05-06 DIAGNOSIS — K582 Mixed irritable bowel syndrome: Secondary | ICD-10-CM | POA: Diagnosis not present

## 2024-05-06 MED ORDER — FAMOTIDINE 40 MG PO TABS: 40.0000 mg | ORAL_TABLET | Freq: Every day | ORAL | 3 refills | Status: DC

## 2024-05-06 NOTE — Patient Instructions (Addendum)
 Trial of FDGard   Please take your proton pump inhibitor medication, prilsec 40 mg  Please take this medication 30 minutes to 1 hour before meals- this makes it more effective.  Avoid spicy and acidic foods Avoid fatty foods Limit your intake of coffee, tea, alcohol, and carbonated drinks Work to maintain a healthy weight Keep the head of the bed elevated at least 3 inches with blocks or a wedge pillow if you are having any nighttime symptoms Stay upright for 2 hours after eating Avoid meals and snacks three to four hours before bedtime  Add on pepcid at night as needed Reflux Gourmet Rescue  It is an ALGINATE THERAPY which is the only intervention that works to safeguard the esophagus by creating a protective barrier that actually stops reflux from happening. -The general directions for use are as stated on the packaging: Take 1 teaspoon (5 ml), or more as needed or as directed by your physician, after meals and before bed. -These general directions address the most common times for reflux to occur, but our Rescue products may be taken anytime. Some individuals may take our product preemptively, when they know they will suffer from reflux, or as needed - when discomfort arises. (If taken around food, it should be consumed last.) -You do not have to take 1 teaspoon (5 ml) of the product. While one teaspoon (5ml) may be the perfect average amount to relieve reflux suffering in some, others may require more or less. You may adjust the amount of Mint Chocolate Rescue and Vanilla Caramel Rescue to the lowest amount necessary to meet your individual needs to improve your quality of life. -You may dilute the product if it is too viscous for you to consume. Keep in mind, however, that the thickness of the product was formulated to provide optimal coating and protection of your throat and esophagus. Though diluting the product is possible, it may reduce the protective function and/or length of  action. -This can be used in conjunction with reflux medications and lifestyle changes.  100% ALL-NATURAL  Paraben FREE, glycerin FREE, & potassium FREE  Made entirely from all-natural ingredients considered safe for children and during pregnancy  No known side effects  All-natural flavor Gluten FREE  Allergen FREE  Vegan  Can find more information here: NameSeizer.co.nz   FIBER SUPPLEMENT You can do metamucil or fibercon once or twice a day but if this causes gas/bloating please switch to Benefiber or Citracel.  Fiber is good for constipation/diarrhea/irritable bowel syndrome.  It can also help with weight loss and can help lower your bad cholesterol (LDL).  Please do 1 TBSP in the morning in water , coffee, or tea.  It can take up to a month before you can see a difference with your bowel movements.  It is cheapest from [employer], [employer], [employer].   Miralax is an osmotic laxative.  It only brings more water  into the stool.  This is safe to take daily.  Can take up to 17 gram of miralax twice a day.  Mix with juice or coffee.  Start 1 capful at night for 3-4 days and reassess your response in 3-4 days.  You can increase and decrease the dose based on your response.  Remember, it can take up to 3-4 days to take effect OR for the effects to wear off.   I often pair this with benefiber in the morning to help assure the stool is not too loose.   Small intestinal bacterial overgrowth (SIBO) occurs  when there is an abnormal increase in the overall bacterial population in the small intestine -- particularly types of bacteria not commonly found in that part of the digestive tract. Small intestinal bacterial overgrowth (SIBO) commonly results when a circumstance -- such as surgery or disease -- slows the passage of food and waste products in the digestive tract, creating a breeding ground for bacteria.  Signs and symptoms of SIBO often include: Loss of  appetite Abdominal pain Nausea Bloating An uncomfortable feeling of fullness after eating Diarrhea or constipation, depending on the type of gas produced  What foods trigger SIBO? While foods aren't the original cause of SIBO, certain foods do encourage the overgrowth of the wrong bacteria in your small intestine. If you're feeding them their favorite foods, they're going to grow more, and that will trigger more of your SIBO symptoms. By the same token, you can help reduce the overgrowth by starving the problematic bacteria of their favorite foods. This strategy has led to a number of proposed SIBO eating plans. The plans vary, and so do individual results. But in general, they tend to recommend limiting carbohydrates.  These include: Sugars and sweeteners. Fruits and starchy vegetables. Dairy products. Grains.  There is a test for this we can do called a breath test, if you are positive we will treat you with an antibiotic to see if it helps.  Your symptoms are very suspicious for this condition, as discussed, we will start you on an antibiotic to see if this helps.

## 2024-05-09 ENCOUNTER — Other Ambulatory Visit: Payer: Self-pay | Admitting: Urology

## 2024-05-09 DIAGNOSIS — D49512 Neoplasm of unspecified behavior of left kidney: Secondary | ICD-10-CM

## 2024-05-13 ENCOUNTER — Ambulatory Visit
Admission: RE | Admit: 2024-05-13 | Discharge: 2024-05-13 | Disposition: A | Discharge status: Home / Self Care | Source: Ambulatory Visit | Attending: Urology | Admitting: Urology

## 2024-05-13 DIAGNOSIS — D49512 Neoplasm of unspecified behavior of left kidney: Secondary | ICD-10-CM

## 2024-05-13 MED ORDER — IOPAMIDOL (ISOVUE-300) INJECTION 61%
125.0000 mL | Freq: Once | INTRAVENOUS | Status: AC | PRN
Start: 2024-05-13 — End: 2024-05-13
  Administered 2024-05-13: 125 mL via INTRAVENOUS

## 2024-05-16 ENCOUNTER — Other Ambulatory Visit: Payer: Self-pay | Admitting: Urology

## 2024-05-18 ENCOUNTER — Encounter: Payer: Self-pay | Admitting: Family Medicine

## 2024-05-20 ENCOUNTER — Other Ambulatory Visit: Payer: Self-pay | Admitting: Urology

## 2024-05-21 NOTE — Patient Instructions (Addendum)
 SURGICAL WAITING ROOM VISITATION  Patients having surgery or a procedure may have no more than 2 support people in the waiting area - these visitors may rotate.    Children under the age of 76 must have an adult with them who is not the patient.  Visitors with respiratory illnesses are discouraged from visiting and should remain at home.  If the patient needs to stay at the hospital during part of their recovery, the visitor guidelines for inpatient rooms apply. Pre-op nurse will coordinate an appropriate time for 1 support person to accompany patient in pre-op.  This support person may not rotate.    Please refer to the [facility] website for the visitor guidelines for Inpatients (after your surgery is over and you are in a regular room).       Your procedure is scheduled on: 05-29-24   Report to [facility] Main Entrance    Report to admitting at      0815  AM   Call this number if you have problems the morning of surgery [phone]   Do not eat food  or drink liquids  :After Midnight.                            If you have questions, please contact your surgeon's office.   FOLLOW BOWEL PREP AND ANY ADDITIONAL PRE OP INSTRUCTIONS YOU RECEIVED FROM YOUR SURGEON'S OFFICE!!!     Oral Hygiene is also important to reduce your risk of infection.                                    Remember - BRUSH YOUR TEETH THE MORNING OF SURGERY WITH YOUR REGULAR TOOTHPASTE  DENTURES WILL BE REMOVED PRIOR TO SURGERY PLEASE DO NOT APPLY Poly grip OR ADHESIVES!!!   Do NOT smoke after Midnight   Stop all vitamins and herbal supplements 7 days before surgery.   Take these medicines the morning of surgery with A SIP OF WATER : Tramadol  and xanax  if needed, rosuvastatin , omeprazole , Buspirone , bring rescue inhaler with you,     Bring CPAP mask and tubing day of surgery.                              You may not have any metal on your body including hair pins, jewelry, and body  piercing             Do not wear , lotions, powders, /cologne, or deodorant                 Men may shave face and neck.   Do not bring valuables to the hospital. [facility] IS NOT             RESPONSIBLE   FOR VALUABLES.   Contacts, glasses, dentures or bridgework may not be worn into surgery.   Bring small overnight bag day of surgery.   DO NOT BRING YOUR HOME MEDICATIONS TO THE HOSPITAL. PHARMACY WILL DISPENSE MEDICATIONS LISTED ON YOUR MEDICATION LIST TO YOU DURING YOUR ADMISSION IN THE HOSPITAL!    Patients discharged on the day of surgery will not be allowed to drive home.  Someone NEEDS to stay with you for the first 24 hours after anesthesia.   Special Instructions: Bring a copy of your healthcare power of attorney  and living will documents the day of surgery if you haven't scanned them before.              Please read over the following fact sheets you were given: IF YOU HAVE QUESTIONS ABOUT YOUR PRE-OP INSTRUCTIONS PLEASE CALL [phone].   . If you test positive for Covid or have been in contact with anyone that has tested positive in the last 10 days please notify you surgeon.    [facility] - Preparing for Surgery Before surgery, you can play an important role.  Because skin is not sterile, your skin needs to be as free of germs as possible.  You can reduce the number of germs on your skin by washing with CHG (chlorahexidine gluconate) soap before surgery.  CHG is an antiseptic cleaner which kills germs and bonds with the skin to continue killing germs even after washing. Please DO NOT use if you have an allergy to CHG or antibacterial soaps.  If your skin becomes reddened/irritated stop using the CHG and inform your nurse when you arrive at Short Stay. Do not shave (including legs and underarms) for at least 48 hours prior to the first CHG shower.  You may shave your face/neck.  Please follow these instructions carefully:  1.  Shower with CHG Soap the night before  surgery ONLY (DO NOT USE THE SOAP THE MORNING OF SURGERY).  2.  If you choose to wash your hair, wash your hair first as usual with your normal  shampoo.  3.  After you shampoo, rinse your hair and body thoroughly to remove the shampoo.                             4.  Use CHG as you would any other liquid soap.  You can apply chg directly to the skin and wash.  Gently with a scrungie or clean washcloth.  5.  Apply the CHG Soap to your body ONLY FROM THE NECK DOWN.   Do  not use on face/ open                           Wound or open sores. Avoid contact with eyes, ears mouth and  genitals (private parts).                       Wash face,  Genitals (private parts) with your normal soap.             6.  Wash thoroughly, paying special attention to the area where your  surgery  will be performed.  7.  Thoroughly rinse your body with warm water  from the neck down.  8.  DO NOT shower/wash with your normal soap after using and rinsing off the CHG Soap.                9.  Pat yourself dry with a clean towel.            10.  Wear clean pajamas.            11.  Place clean sheets on your bed the night of your first shower and do not  sleep with pets. Day of Surgery : Do not apply any CHG, lotions/deodorants the morning of surgery.  Please wear clean clothes to the hospital/surgery center.  FAILURE TO FOLLOW THESE INSTRUCTIONS MAY RESULT IN THE CANCELLATION OF YOUR  SURGERY  PATIENT SIGNATURE_________________________________  NURSE SIGNATURE__________________________________  ________________________________________________________________________ WHAT IS A BLOOD TRANSFUSION? Blood Transfusion Information  A transfusion is the replacement of blood or some of its parts. Blood is made up of multiple cells which provide different functions. Red blood cells carry oxygen and are used for blood loss replacement. White blood cells fight against infection. Platelets control bleeding. Plasma helps clot  blood. Other blood products are available for specialized needs, such as hemophilia or other clotting disorders. BEFORE THE TRANSFUSION  Who gives blood for transfusions?  Healthy volunteers who are fully evaluated to make sure their blood is safe. This is blood bank blood. Transfusion therapy is the safest it has ever been in the practice of medicine. Before blood is taken from a donor, a complete history is taken to make sure that person has no history of diseases nor engages in risky social behavior (examples are intravenous drug use or sexual activity with multiple partners). The donor's travel history is screened to minimize risk of transmitting infections, such as malaria. The donated blood is tested for signs of infectious diseases, such as HIV and hepatitis. The blood is then tested to be sure it is compatible with you in order to minimize the chance of a transfusion reaction. If you or a relative donates blood, this is often done in anticipation of surgery and is not appropriate for emergency situations. It takes many days to process the donated blood. RISKS AND COMPLICATIONS Although transfusion therapy is very safe and saves many lives, the main dangers of transfusion include:  Getting an infectious disease. Developing a transfusion reaction. This is an allergic reaction to something in the blood you were given. Every precaution is taken to prevent this. The decision to have a blood transfusion has been considered carefully by your caregiver before blood is given. Blood is not given unless the benefits outweigh the risks. AFTER THE TRANSFUSION Right after receiving a blood transfusion, you will usually feel much better and more energetic. This is especially true if your red blood cells have gotten low (anemic). The transfusion raises the level of the red blood cells which carry oxygen, and this usually causes an energy increase. The nurse administering the transfusion will monitor you  carefully for complications. HOME CARE INSTRUCTIONS  No special instructions are needed after a transfusion. You may find your energy is better. Speak with your caregiver about any limitations on activity for underlying diseases you may have. SEEK MEDICAL CARE IF:  Your condition is not improving after your transfusion. You develop redness or irritation at the intravenous (IV) site. SEEK IMMEDIATE MEDICAL CARE IF:  Any of the following symptoms occur over the next 12 hours: Shaking chills. You have a temperature by mouth above 102 F (38.9 C), not controlled by medicine. Chest, back, or muscle pain. People around you feel you are not acting correctly or are confused. Shortness of breath or difficulty breathing. Dizziness and fainting. You get a rash or develop hives. You have a decrease in urine output. Your urine turns a dark color or changes to pink, red, or brown. Any of the following symptoms occur over the next 10 days: You have a temperature by mouth above 102 F (38.9 C), not controlled by medicine. Shortness of breath. Weakness after normal activity. The white part of the eye turns yellow (jaundice). You have a decrease in the amount of urine or are urinating less often. Your urine turns a dark color or changes to pink, red, or brown. Document  Released: 07/15/2000 Document Revised: 10/10/2011 Document Reviewed: 03/03/2008 [facility] Patient Information 2014 [place], [place].  _______________________________________________________________________

## 2024-05-21 NOTE — Progress Notes (Signed)
 PCP -Garnette Lukes, MD LOV 01-11-24 epic  Cardiologist - Aleene Finely, MD lov 01-04-24 epic  PPM/ICD -  Device Orders -  Rep Notified -   Chest x-ray - 01-13-24 epic EKG -01-04-24 epic  Stress Test -  ECHO -  Cardiac Cath -  CT cardiac score 08-11-23 epic   344  Sleep Study - n/a CPAP - n/a  Fasting Blood Sugar -  Checks Blood Sugar _n/a____ times a day  Blood Thinner Instructions:n/a Aspirin Instructions:n/a  ERAS Protcol - PRE-SURGERY n/a   Pt. Has bowel prep instructions COVID vaccine -yes  Activity--Able to climb a flight of stairs with no CP or SOB Anesthesia review: CAD, HTN,HLD  Patient denies shortness of breath, fever, cough and chest pain at PAT appointment   All instructions explained to the patient, with a verbal understanding of the material. Patient agrees to go over the instructions while at home for a better understanding. Patient also instructed to self quarantine after being tested for COVID-19. The opportunity to ask questions was provided.

## 2024-05-22 ENCOUNTER — Encounter (HOSPITAL_COMMUNITY)
Admission: RE | Admit: 2024-05-22 | Discharge: 2024-05-22 | Disposition: A | Discharge status: Home / Self Care | Source: Ambulatory Visit | Attending: Urology | Admitting: Urology

## 2024-05-22 ENCOUNTER — Encounter (HOSPITAL_COMMUNITY): Payer: Self-pay

## 2024-05-22 ENCOUNTER — Other Ambulatory Visit: Payer: Self-pay

## 2024-05-22 VITALS — BP 157/87 | HR 71 | Temp 97.7°F | Resp 16 | Ht 65.75 in | Wt 154.8 lb

## 2024-05-22 DIAGNOSIS — Z01818 Encounter for other preprocedural examination: Secondary | ICD-10-CM | POA: Insufficient documentation

## 2024-05-22 DIAGNOSIS — I10 Essential (primary) hypertension: Secondary | ICD-10-CM | POA: Insufficient documentation

## 2024-05-22 LAB — BASIC METABOLIC PANEL WITH GFR
Anion gap: 11 (ref 5–15)
BUN: 21 mg/dL (ref 8–23)
CO2: 24 mmol/L (ref 22–32)
Calcium: 9.9 mg/dL (ref 8.9–10.3)
Chloride: 103 mmol/L (ref 98–111)
Creatinine, Ser: 1.08 mg/dL (ref 0.61–1.24)
GFR, Estimated: >60 mL/min (ref >60)
Glucose, Bld: 96 mg/dL (ref 70–99)
Potassium: 4.6 mmol/L (ref 3.5–5.1)
Sodium: 138 mmol/L (ref 135–145)

## 2024-05-22 LAB — CBC
HCT: 52.9 % — ABNORMAL HIGH (ref 39.0–52.0)
Hemoglobin: 17 g/dL (ref 13.0–17.0)
MCH: 29.1 pg (ref 26.0–34.0)
MCHC: 32.1 g/dL (ref 30.0–36.0)
MCV: 90.6 fL (ref 80.0–100.0)
Platelets: 344 K/uL (ref 150–400)
RBC: 5.84 MIL/uL — ABNORMAL HIGH (ref 4.22–5.81)
RDW: 13.2 % (ref 11.5–15.5)
WBC: 8.6 K/uL (ref 4.0–10.5)
nRBC: 0 % (ref 0.0–0.2)

## 2024-05-29 ENCOUNTER — Inpatient Hospital Stay (HOSPITAL_COMMUNITY): Payer: Self-pay | Admitting: Physician Assistant

## 2024-05-29 ENCOUNTER — Other Ambulatory Visit: Payer: Self-pay

## 2024-05-29 ENCOUNTER — Encounter (HOSPITAL_COMMUNITY): Payer: Self-pay | Admitting: Urology

## 2024-05-29 ENCOUNTER — Inpatient Hospital Stay (HOSPITAL_COMMUNITY): Payer: Self-pay | Admitting: Anesthesiology

## 2024-05-29 ENCOUNTER — Encounter (HOSPITAL_COMMUNITY)
Admission: RE | Disposition: A | Discharge status: Home / Self Care | Payer: Self-pay | Source: Home / Self Care | Attending: Urology

## 2024-05-29 ENCOUNTER — Inpatient Hospital Stay (HOSPITAL_COMMUNITY)
Admission: RE | Admit: 2024-05-29 | Discharge: 2024-05-30 | DRG: 658 | Disposition: A | Discharge status: Home / Self Care | Attending: Urology | Admitting: Urology

## 2024-05-29 DIAGNOSIS — Z87891 Personal history of nicotine dependence: Secondary | ICD-10-CM

## 2024-05-29 DIAGNOSIS — C642 Malignant neoplasm of left kidney, except renal pelvis: Principal | ICD-10-CM | POA: Diagnosis present

## 2024-05-29 DIAGNOSIS — N2889 Other specified disorders of kidney and ureter: Principal | ICD-10-CM | POA: Diagnosis present

## 2024-05-29 DIAGNOSIS — Z91013 Allergy to seafood: Secondary | ICD-10-CM

## 2024-05-29 DIAGNOSIS — Z8616 Personal history of COVID-19: Secondary | ICD-10-CM

## 2024-05-29 DIAGNOSIS — Z803 Family history of malignant neoplasm of breast: Secondary | ICD-10-CM

## 2024-05-29 DIAGNOSIS — N393 Stress incontinence (female) (male): Secondary | ICD-10-CM | POA: Diagnosis present

## 2024-05-29 DIAGNOSIS — Z888 Allergy status to other drugs, medicaments and biological substances status: Secondary | ICD-10-CM

## 2024-05-29 DIAGNOSIS — Z79899 Other long term (current) drug therapy: Secondary | ICD-10-CM

## 2024-05-29 DIAGNOSIS — Z83511 Family history of glaucoma: Secondary | ICD-10-CM

## 2024-05-29 DIAGNOSIS — N400 Enlarged prostate without lower urinary tract symptoms: Secondary | ICD-10-CM | POA: Diagnosis present

## 2024-05-29 DIAGNOSIS — Z800 Family history of malignant neoplasm of digestive organs: Secondary | ICD-10-CM

## 2024-05-29 DIAGNOSIS — I10 Essential (primary) hypertension: Secondary | ICD-10-CM | POA: Diagnosis present

## 2024-05-29 DIAGNOSIS — Z8249 Family history of ischemic heart disease and other diseases of the circulatory system: Secondary | ICD-10-CM

## 2024-05-29 DIAGNOSIS — Z880 Allergy status to penicillin: Secondary | ICD-10-CM

## 2024-05-29 DIAGNOSIS — K219 Gastro-esophageal reflux disease without esophagitis: Secondary | ICD-10-CM | POA: Diagnosis present

## 2024-05-29 DIAGNOSIS — Z8042 Family history of malignant neoplasm of prostate: Secondary | ICD-10-CM

## 2024-05-29 DIAGNOSIS — Z860100 Personal history of colon polyps, unspecified: Secondary | ICD-10-CM

## 2024-05-29 DIAGNOSIS — R066 Hiccough: Principal | ICD-10-CM

## 2024-05-29 DIAGNOSIS — E785 Hyperlipidemia, unspecified: Secondary | ICD-10-CM | POA: Diagnosis present

## 2024-05-29 DIAGNOSIS — M549 Dorsalgia, unspecified: Secondary | ICD-10-CM | POA: Diagnosis present

## 2024-05-29 DIAGNOSIS — Z91014 Allergy to mammalian meats: Secondary | ICD-10-CM

## 2024-05-29 DIAGNOSIS — Z8546 Personal history of malignant neoplasm of prostate: Secondary | ICD-10-CM

## 2024-05-29 HISTORY — PX: LAPAROSCOPIC NEPHRECTOMY: SHX1930

## 2024-05-29 LAB — TYPE AND SCREEN
ABO/RH(D): B POS
Antibody Screen: NEGATIVE

## 2024-05-29 LAB — HIV ANTIBODY (ROUTINE TESTING W REFLEX): HIV Screen 4th Generation wRfx: NONREACTIVE

## 2024-05-29 SURGERY — NEPHRECTOMY, RADICAL, LAPAROSCOPIC, ADULT
Anesthesia: General | Laterality: Left

## 2024-05-29 MED ORDER — LACTATED RINGERS IV SOLN: INTRAVENOUS | Status: DC

## 2024-05-29 MED ORDER — OXYCODONE HCL 5 MG/5ML PO SOLN: 5.0000 mg | Freq: Once | ORAL | Status: DC | PRN

## 2024-05-29 MED ORDER — SUGAMMADEX SODIUM 200 MG/2ML IV SOLN
INTRAVENOUS | Status: AC
  Filled 2024-05-29: qty 2

## 2024-05-29 MED ORDER — LIDOCAINE HCL (PF) 2 % IJ SOLN
INTRAMUSCULAR | Status: AC
  Filled 2024-05-29: qty 5

## 2024-05-29 MED ORDER — HYDROMORPHONE HCL 1 MG/ML IJ SOLN
INTRAMUSCULAR | Status: AC
  Filled 2024-05-29: qty 1

## 2024-05-29 MED ORDER — BUSPIRONE HCL 5 MG PO TABS
5.0000 mg | ORAL_TABLET | Freq: Three times a day (TID) | ORAL | Status: DC
  Administered 2024-05-29 – 2024-05-30 (×3): 5 mg via ORAL
  Filled 2024-05-29 (×3): qty 1

## 2024-05-29 MED ORDER — TRAMADOL HCL 50 MG PO TABS: 50.0000 mg | ORAL_TABLET | Freq: Four times a day (QID) | ORAL | 0 refills | Status: AC | PRN

## 2024-05-29 MED ORDER — HEMOSTATIC AGENTS (NO CHARGE) OPTIME
TOPICAL | Status: DC | PRN
  Administered 2024-05-29: 1 via TOPICAL

## 2024-05-29 MED ORDER — ACETAMINOPHEN 500 MG PO TABS
1000.0000 mg | ORAL_TABLET | Freq: Once | ORAL | Status: AC
  Administered 2024-05-29: 1000 mg via ORAL
  Filled 2024-05-29: qty 2

## 2024-05-29 MED ORDER — LACTATED RINGERS IV SOLN: INTRAVENOUS | Status: AC

## 2024-05-29 MED ORDER — SCOPOLAMINE 1 MG/3DAYS TD PT72
1.0000 | MEDICATED_PATCH | TRANSDERMAL | Status: DC
  Administered 2024-05-29: 1 mg via TRANSDERMAL
  Filled 2024-05-29: qty 1

## 2024-05-29 MED ORDER — LACTATED RINGERS IR SOLN
Status: DC | PRN
  Administered 2024-05-29: 1

## 2024-05-29 MED ORDER — SUGAMMADEX SODIUM 200 MG/2ML IV SOLN
INTRAVENOUS | Status: DC | PRN
  Administered 2024-05-29 (×2): 200 mg via INTRAVENOUS

## 2024-05-29 MED ORDER — SODIUM CHLORIDE (PF) 0.9 % IJ SOLN
INTRAMUSCULAR | Status: AC
  Filled 2024-05-29: qty 30

## 2024-05-29 MED ORDER — SODIUM CHLORIDE (PF) 0.9 % IJ SOLN
INTRAMUSCULAR | Status: AC
  Filled 2024-05-29: qty 20

## 2024-05-29 MED ORDER — TRAMADOL HCL 50 MG PO TABS
50.0000 mg | ORAL_TABLET | Freq: Four times a day (QID) | ORAL | Status: DC | PRN
  Administered 2024-05-29 – 2024-05-30 (×2): 50 mg via ORAL
  Filled 2024-05-29 (×2): qty 1

## 2024-05-29 MED ORDER — ACETAMINOPHEN 10 MG/ML IV SOLN
1000.0000 mg | Freq: Three times a day (TID) | INTRAVENOUS | Status: AC
  Administered 2024-05-29 – 2024-05-30 (×3): 1000 mg via INTRAVENOUS
  Filled 2024-05-29 (×3): qty 100

## 2024-05-29 MED ORDER — MIDAZOLAM HCL 2 MG/2ML IJ SOLN
INTRAMUSCULAR | Status: AC
  Filled 2024-05-29: qty 2

## 2024-05-29 MED ORDER — MIDAZOLAM HCL (PF) 2 MG/2ML IJ SOLN
INTRAMUSCULAR | Status: DC | PRN
  Administered 2024-05-29: 2 mg via INTRAVENOUS

## 2024-05-29 MED ORDER — PANTOPRAZOLE SODIUM 40 MG PO TBEC
40.0000 mg | DELAYED_RELEASE_TABLET | Freq: Every day | ORAL | Status: DC
  Administered 2024-05-30: 40 mg via ORAL
  Filled 2024-05-29: qty 1

## 2024-05-29 MED ORDER — PROMETHAZINE HCL 12.5 MG PO TABS: 12.5000 mg | ORAL_TABLET | Freq: Four times a day (QID) | ORAL | 0 refills | Status: AC | PRN

## 2024-05-29 MED ORDER — FAMOTIDINE 20 MG PO TABS
40.0000 mg | ORAL_TABLET | Freq: Every day | ORAL | Status: DC
  Administered 2024-05-29: 40 mg via ORAL
  Filled 2024-05-29: qty 2

## 2024-05-29 MED ORDER — 0.9 % SODIUM CHLORIDE (POUR BTL) OPTIME
TOPICAL | Status: DC | PRN
  Administered 2024-05-29: 1000 mL

## 2024-05-29 MED ORDER — STERILE WATER FOR IRRIGATION IR SOLN
Status: DC | PRN
  Administered 2024-05-29: 1000 mL

## 2024-05-29 MED ORDER — HYDROMORPHONE HCL 1 MG/ML IJ SOLN
0.5000 mg | INTRAMUSCULAR | Status: DC | PRN
  Administered 2024-05-29 (×3): 0.5 mg via INTRAVENOUS
  Filled 2024-05-29 (×3): qty 0.5

## 2024-05-29 MED ORDER — CHLORHEXIDINE GLUCONATE 0.12 % MT SOLN
15.0000 mL | Freq: Once | OROMUCOSAL | Status: AC
  Administered 2024-05-29: 15 mL via OROMUCOSAL

## 2024-05-29 MED ORDER — ROCURONIUM BROMIDE 10 MG/ML (PF) SYRINGE
PREFILLED_SYRINGE | INTRAVENOUS | Status: AC
  Filled 2024-05-29: qty 10

## 2024-05-29 MED ORDER — BUPIVACAINE LIPOSOME 1.3 % IJ SUSP
INTRAMUSCULAR | Status: DC | PRN
  Administered 2024-05-29: 20 mL

## 2024-05-29 MED ORDER — BUPIVACAINE-EPINEPHRINE 0.5% -1:200000 IJ SOLN
INTRAMUSCULAR | Status: DC | PRN
  Administered 2024-05-29: 30 mL

## 2024-05-29 MED ORDER — FENTANYL CITRATE (PF) 250 MCG/5ML IJ SOLN
INTRAMUSCULAR | Status: AC
  Filled 2024-05-29: qty 5

## 2024-05-29 MED ORDER — PHENYLEPHRINE HCL-NACL 20-0.9 MG/250ML-% IV SOLN
INTRAVENOUS | Status: DC | PRN
  Administered 2024-05-29: 20 ug/min via INTRAVENOUS

## 2024-05-29 MED ORDER — HYDROMORPHONE HCL 1 MG/ML IJ SOLN
0.2500 mg | INTRAMUSCULAR | Status: DC | PRN
  Administered 2024-05-29 (×2): 0.5 mg via INTRAVENOUS

## 2024-05-29 MED ORDER — ONDANSETRON HCL 4 MG/2ML IJ SOLN
INTRAMUSCULAR | Status: DC | PRN
  Administered 2024-05-29: 4 mg via INTRAVENOUS

## 2024-05-29 MED ORDER — FENTANYL CITRATE (PF) 100 MCG/2ML IJ SOLN
INTRAMUSCULAR | Status: AC
  Filled 2024-05-29: qty 2

## 2024-05-29 MED ORDER — MIDAZOLAM HCL (PF) 2 MG/2ML IJ SOLN: 0.5000 mg | Freq: Once | INTRAMUSCULAR | Status: DC | PRN

## 2024-05-29 MED ORDER — OXYCODONE HCL 5 MG PO TABS: 5.0000 mg | ORAL_TABLET | Freq: Once | ORAL | Status: DC | PRN

## 2024-05-29 MED ORDER — PROPOFOL 10 MG/ML IV BOLUS
INTRAVENOUS | Status: DC | PRN
  Administered 2024-05-29: 200 mg via INTRAVENOUS

## 2024-05-29 MED ORDER — ONDANSETRON HCL 4 MG/2ML IJ SOLN: 4.0000 mg | INTRAMUSCULAR | Status: DC | PRN

## 2024-05-29 MED ORDER — LIDOCAINE HCL (PF) 2 % IJ SOLN
INTRAMUSCULAR | Status: DC | PRN
  Administered 2024-05-29: 30 mg via INTRADERMAL

## 2024-05-29 MED ORDER — AMITRIPTYLINE HCL 25 MG PO TABS: 25.0000 mg | ORAL_TABLET | Freq: Every evening | ORAL | Status: DC | PRN

## 2024-05-29 MED ORDER — MEPERIDINE HCL 100 MG/ML IJ SOLN: 6.2500 mg | INTRAMUSCULAR | Status: DC | PRN

## 2024-05-29 MED ORDER — CEFAZOLIN SODIUM-DEXTROSE 2-4 GM/100ML-% IV SOLN
2.0000 g | Freq: Once | INTRAVENOUS | Status: AC
  Administered 2024-05-29: 2 g via INTRAVENOUS
  Filled 2024-05-29: qty 100

## 2024-05-29 MED ORDER — SODIUM CHLORIDE (PF) 0.9 % IJ SOLN
INTRAMUSCULAR | Status: DC | PRN
  Administered 2024-05-29: 45 mL

## 2024-05-29 MED ORDER — BUPIVACAINE LIPOSOME 1.3 % IJ SUSP
INTRAMUSCULAR | Status: AC
  Filled 2024-05-29: qty 20

## 2024-05-29 MED ORDER — ALPRAZOLAM 0.25 MG PO TABS
0.2500 mg | ORAL_TABLET | Freq: Two times a day (BID) | ORAL | Status: DC | PRN
  Administered 2024-05-30: 0.25 mg via ORAL
  Filled 2024-05-29 (×2): qty 1

## 2024-05-29 MED ORDER — ORAL CARE MOUTH RINSE: 15.0000 mL | Freq: Once | OROMUCOSAL | Status: AC

## 2024-05-29 MED ORDER — BISOPROLOL FUMARATE 5 MG PO TABS
2.5000 mg | ORAL_TABLET | Freq: Every day | ORAL | Status: DC
  Administered 2024-05-30: 2.5 mg via ORAL
  Filled 2024-05-29: qty 1

## 2024-05-29 MED ORDER — FENTANYL CITRATE (PF) 250 MCG/5ML IJ SOLN
INTRAMUSCULAR | Status: DC | PRN
  Administered 2024-05-29: 150 ug via INTRAVENOUS
  Administered 2024-05-29 (×3): 50 ug via INTRAVENOUS
  Administered 2024-05-29 (×2): 25 ug via INTRAVENOUS

## 2024-05-29 MED ORDER — POLYETHYLENE GLYCOL 3350 17 GM/SCOOP PO POWD: 238.0000 g | Freq: Once | ORAL | Status: DC

## 2024-05-29 MED ORDER — SODIUM CHLORIDE 0.9 % IV SOLN: INTRAVENOUS | Status: DC | PRN

## 2024-05-29 MED ORDER — ROCURONIUM BROMIDE 10 MG/ML (PF) SYRINGE
PREFILLED_SYRINGE | INTRAVENOUS | Status: DC | PRN
  Administered 2024-05-29: 10 mg via INTRAVENOUS
  Administered 2024-05-29 (×3): 20 mg via INTRAVENOUS
  Administered 2024-05-29: 10 mg via INTRAVENOUS
  Administered 2024-05-29: 15 mg via INTRAVENOUS
  Administered 2024-05-29: 50 mg via INTRAVENOUS
  Administered 2024-05-29: 15 mg via INTRAVENOUS

## 2024-05-29 MED ORDER — BUPIVACAINE-EPINEPHRINE (PF) 0.5% -1:200000 IJ SOLN
INTRAMUSCULAR | Status: AC
  Filled 2024-05-29: qty 30

## 2024-05-29 MED ORDER — BISACODYL 5 MG PO TBEC: 5.0000 mg | DELAYED_RELEASE_TABLET | Freq: Every day | ORAL | Status: DC | PRN

## 2024-05-29 MED ORDER — CEFAZOLIN SODIUM-DEXTROSE 1-4 GM/50ML-% IV SOLN
1.0000 g | Freq: Three times a day (TID) | INTRAVENOUS | Status: AC
  Administered 2024-05-29 – 2024-05-30 (×2): 1 g via INTRAVENOUS
  Filled 2024-05-29 (×3): qty 50

## 2024-05-29 MED ORDER — DEXAMETHASONE SOD PHOSPHATE PF 10 MG/ML IJ SOLN
INTRAMUSCULAR | Status: DC | PRN
  Administered 2024-05-29: 8 mg via INTRAVENOUS

## 2024-05-29 SURGICAL SUPPLY — 56 items
APPLICATOR ARISTA FLEXITIP XL (MISCELLANEOUS) IMPLANT
APPLICATOR SURGIFLO ENDO (HEMOSTASIS) IMPLANT
BAG COUNTER SPONGE SURGICOUNT (BAG) ×1 IMPLANT
BAG LAPAROSCOPIC 12 15 PORT 16 (BASKET) IMPLANT
BAG ZIPLOCK 12X15 (MISCELLANEOUS) ×1 IMPLANT
BLADE EXTENDED COATED 6.5IN (ELECTRODE) IMPLANT
BLADE SURG SZ10 CARB STEEL (BLADE) IMPLANT
CHLORAPREP W/TINT 26 (MISCELLANEOUS) ×1 IMPLANT
CLIP APPLIE ROT 10 11.4 M/L (STAPLE) IMPLANT
CLIP LIGATING HEM O LOK PURPLE (MISCELLANEOUS) ×1 IMPLANT
CLIP LIGATING HEMO LOK XL GOLD (MISCELLANEOUS) ×1 IMPLANT
CLIP LIGATING HEMO O LOK GREEN (MISCELLANEOUS) IMPLANT
CUTTER FLEX LINEAR 45M (STAPLE) IMPLANT
DERMABOND ADVANCED .7 DNX12 (GAUZE/BANDAGES/DRESSINGS) ×1 IMPLANT
DRAPE INCISE IOBAN 66X45 STRL (DRAPES) ×1 IMPLANT
DRAPE LAPAROSCOPIC ABDOMINAL (DRAPES) ×1 IMPLANT
DRAPE WARM FLUID 44X44 (DRAPES) IMPLANT
ELECT PENCIL ROCKER SW 15FT (MISCELLANEOUS) ×1 IMPLANT
ELECT REM PT RETURN 15FT ADLT (MISCELLANEOUS) ×1 IMPLANT
GLOVE SURG LX STRL 7.5 STRW (GLOVE) ×1 IMPLANT
GOWN STRL REUS W/ TWL XL LVL3 (GOWN DISPOSABLE) ×1 IMPLANT
HEMOSTAT ARISTA ABSORB 3G PWDR (HEMOSTASIS) IMPLANT
HEMOSTAT SURGICEL 2X3 (HEMOSTASIS) IMPLANT
HEMOSTAT SURGICEL 4X8 (HEMOSTASIS) IMPLANT
IRRIGATION SUCT STRKRFLW 2 WTP (MISCELLANEOUS) ×1 IMPLANT
KIT BASIN OR (CUSTOM PROCEDURE TRAY) ×1 IMPLANT
KIT TURNOVER KIT A (KITS) ×1 IMPLANT
MANIFOLD NEPTUNE II (INSTRUMENTS) ×1 IMPLANT
MARKER SKIN DUAL TIP RULER LAB (MISCELLANEOUS) ×1 IMPLANT
NDL INSUFFLATION 14GA 120MM (NEEDLE) IMPLANT
NDL SPNL 22GX3.5 QUINCKE BK (NEEDLE) IMPLANT
NEEDLE INSUFFLATION 14GA 120MM (NEEDLE) IMPLANT
NEEDLE SPNL 22GX3.5 QUINCKE BK (NEEDLE) IMPLANT
PAD POSITIONING PINK XL (MISCELLANEOUS) ×1 IMPLANT
PROTECTOR NERVE ULNAR (MISCELLANEOUS) ×2 IMPLANT
RELOAD STAPLE 45 2.5 WHT GRN (ENDOMECHANICALS) IMPLANT
RELOAD STAPLE 45 2.6 WHT THIN (STAPLE) IMPLANT
RELOAD STAPLE 45 3.5 BLU ETS (ENDOMECHANICALS) IMPLANT
SCISSORS LAP 5X35 DISP (ENDOMECHANICALS) ×1 IMPLANT
SET TUBE SMOKE EVAC HIGH FLOW (TUBING) ×1 IMPLANT
SHEARS HARMONIC 36 ACE (MISCELLANEOUS) ×1 IMPLANT
SLEEVE Z-THREAD 12X100MM (TROCAR) ×1 IMPLANT
SLEEVE Z-THREAD 5X100MM (TROCAR) ×1 IMPLANT
SPIKE FLUID TRANSFER (MISCELLANEOUS) ×1 IMPLANT
SPONGE T-LAP 4X18 ~~LOC~~+RFID (SPONGE) IMPLANT
SUT MNCRL AB 4-0 PS2 18 (SUTURE) ×2 IMPLANT
SUT PDS AB 0 CT1 36 (SUTURE) ×1 IMPLANT
SUT VIC AB 2-0 CT1 27XBRD (SUTURE) IMPLANT
SUT VICRYL 0 UR6 27IN ABS (SUTURE) ×1 IMPLANT
TAPE CLOTH 4X10 WHT NS (GAUZE/BANDAGES/DRESSINGS) IMPLANT
TOWEL OR 17X26 10 PK STRL BLUE (TOWEL DISPOSABLE) ×1 IMPLANT
TRAY FOLEY MTR SLVR 16FR STAT (SET/KITS/TRAYS/PACK) ×1 IMPLANT
TRAY LAPAROSCOPIC (CUSTOM PROCEDURE TRAY) ×1 IMPLANT
TROCAR ADV FIXATION 12X100MM (TROCAR) ×1 IMPLANT
TROCAR Z THREAD OPTICAL 12X100 (TROCAR) ×1 IMPLANT
TROCAR Z-THREAD OPTICAL 5X100M (TROCAR) ×1 IMPLANT

## 2024-05-29 NOTE — Op Note (Deleted)
 Preoperative diagnosis:  Light renal mass   Postoperative diagnosis:  same   Procedure: Laparoscopic left radical nephrectomy  Surgeon: Morene MICAEL Salines, MD Resident Assistant: Maurilio Agar, MD  Anesthesia: General  Complications: None  Intraoperative findings:  #1 Simple hilum.   #2 TAPs block #3  Extracted through extended incision of lateral ports  EBL: 50cc  Specimens:  Left kidney and proximal ureter  Indication: Tyler [name] is a 68 y.o. patient with large left renal mass.  After reviewing the management options for treatment, he elected to proceed with the above surgical procedure(s). We have discussed the potential benefits and risks of the procedure, side effects of the proposed treatment, the likelihood of the patient achieving the goals of the procedure, and any potential problems that might occur during the procedure or recuperation. Informed consent has been obtained.  Description of procedure:  The case was performed hand and hand with my first assistant, PA Alan Hammonds, and I could not do the case without her. She was involved from placing the ports, intraoperative procedure, and closing of the port incisions.  A site was selected lateral to the umbilicus for placement of the camera port. This was placed using a standard open Hassan technique which allowed entry into the peritoneal cavity under direct vision and without difficulty. A 12 mm Hassan cannula was placed and a pneumoperitoneum established. The camera was then used to inspect the abdomen and there was no evidence of any intra-abdominal injuries or other abnormalities. The remaining abdominal ports were then placed. One 5 mm trocar was placed subcostal margin in the left upper quadrant, the second 5 mm trocar was placed laterally to the camera port so as to triangulate the kidney. An assistant port was then placed in between the camera and the left lateral port. The assistant port was a 12 mm port.    The white line of Toldt was incised allowing the colon to be mobilized medially and the plane between the mesocolon and the anterior layer of Gerota's fascia to be developed and the kidney exposed. The ureter and gonadal vein were identified inferiorly and the ureter was lifted anteriorly off the psoas muscle. The gonadal vein was then dissected out inferior to the lower pole and 2 clips were placed both superiorly and inferiorly and then ligated. A second 5 mm port was placed in the left lower quadrant to help facilitate lifting up of the kidney. Dissection proceeded superiorly along the gonadal vein until the renal vein was identified. The renal hilum was then carefully isolated with a combination of blunt and sharp dissectiong allowing the renal arterial and venous structures to be separated and isolated.   The renal artery was isolated and ligated with a 45 mm Flex ETS stapler. The renal vein was then isolated and also ligated and divided with a 45 mm Flex ETS stapler.   Gerota's fascia was intentionally entered superiorly and the space between the adrenal gland and the kidney was developed allowing the adrenal gland to be spared. A third staple load was then used which divided the adrenal gland. once the hilum had been ligated dissection ensued from the inferior pole of the kidney. The ureter was transected placing 2 clips on the stay side and one on the specimen side. The lateral attachments of the kidney were then freed. Our attention was then turned to the upper pole which was dissected off of the spleen and the splenic attachments. The pancreas and colon were noted to be well  away from the structures. The remaining of the posterior aspect of the attachments was then ligated using a Harmonic Scalpel. Once the kidney was freed from its attachments it was shown to medially and the vascular hilum was inspected and noted to be sufficiently hemostatic. There was slight bit of oozing from the adrenal gland  which was cauterized and then Surgicel placed over top. Insufflation was then turned down to 8 mm mercury and the kidney bed was noted to be hemostatic.   80cc of Exparel  was then injected into the left anterior axillary line b/w the iliac crest and the twelfth rib under laparoscopic guidance. The layer between the tranversus abdominus and the internal oblique was targeted.   The kidney/ureter specimen was then placed into a 12 mm Endocatch II retrieval bag, this was passed through the port site of the assistant port and left lower quadrant. The trochars were then removed under visual guidance to ensure no ongoing port site bleeding was occurring. The extraction incision was extended from the 12 mm left lower quadrant port. The external oblique and internal oblique muscles were spread as best as possible with as little muscle fibers ligated as possible in order to safely extracted the specimen. The internal oblique flash of was then closed with 2-0 Vicryl in an interrupted figure-of-eight fashion. The external oblique fascia was closed with a 0 looped PDS. The camera port was then closed with 2-0 Vicryl the level of the fascia. All incisions were injected with Exparel  and reapproximated at the skin with 4-0 monocryl sutures. Dermabond was applied to the skin. The patient tolerated the procedure well and without complications and was transferred to the recovery unit in satisfactory condition.

## 2024-05-29 NOTE — H&P (Signed)
 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  The patient is here for further discussion of his right renal mass. This was seen incidentally. Had a follow-up CT scan  demonstrating a largely endophytic 6 cm renal mass concerning for RCC. There does not appear to be any vein  involvement or evidence of metastatic disease.  Historical Summary: Prostate cancer2/23: Status post right help, and bilateral nerve spare, Gleason 4+3 = 7, 0/9 positive nodes, PSA 5.38.    Most recent PSA remains undetectable  Erectile functionPatient's erections are adequate with sildenafil and Trimix  Stress urinary incontinence- the patient underwent a male sling on 7/24. He is now using 1 pad per day.  Left renal massIncidental finding from an MRI related to his back pain.  Review of systems: A 12 point comprehensive review of systems was obtained and is negative unless otherwise stated in the history of present illness.  Patient Active Problem List   Diagnosis Date Noted   COVID-19 08/12/2022   Genetic testing 08/23/2021   History of prostate cancer 08/13/2021   Family history of breast cancer 08/13/2021   Family history of prostate cancer 08/13/2021   Palpitations 11/22/2017   Memory difficulties 11/22/2017   Essential hypertension 11/21/2014   Allergic rhinitis 11/21/2014   BPH (benign prostatic hyperplasia) 11/21/2014   Former smoker 11/21/2014   Plantar fasciitis of left foot 10/22/2014   Metatarsalgia of both feet 09/26/2014   Hematuria 10/05/2013   External hemorrhoids 04/17/2008   Irritable bowel syndrome 04/17/2008   History of colonic polyps 04/16/2008   Anxiety state 12/27/2007   Asthma 12/27/2007   GERD 12/27/2007   Hyperlipidemia 10/26/2007   Rosacea 10/26/2007    No current facility-administered medications on file prior to encounter.   Current Outpatient  Medications on File Prior to Encounter  Medication Sig Dispense Refill   ALPRAZolam  (XANAX ) 0.25 MG tablet Take 1 tablet (0.25 mg total) by mouth 2 (two) times daily as needed for anxiety (do not drive for 6-8 hours after taking). 30 tablet 2   amitriptyline (ELAVIL) 25 MG tablet Take 25 mg by mouth at bedtime as needed for sleep.     bisoprolol  (ZEBETA ) 5 MG tablet TAKE 1 TABLET BY MOUTH DAILY (Patient taking differently: Take 2.5 mg by mouth daily.) 45 tablet 3   busPIRone  (BUSPAR ) 5 MG tablet TAKE 1 TABLET BY MOUTH 3 TIMES A DAY 90 tablet 5   diphenhydramine -acetaminophen  (TYLENOL  PM) 25-500 MG TABS tablet Take 1-2 tablets by mouth at bedtime.     famotidine (PEPCID) 40 MG tablet Take 1 tablet (40 mg total) by mouth at bedtime. 30 tablet 3   FIBER PO Take 3 tablets by mouth daily.     Glycerin, PF, (OPTASE COMFORT DRY EYE) 1 % SOLN Place 1 drop into both eyes daily as needed (dry eyes).     mometasone (ELOCON) 0.1 % cream Apply 1 Application topically daily as needed (eczema).     omeprazole  (PRILOSEC) 40 MG capsule Take 1 capsule (40 mg total) by mouth daily. 90 capsule 0   polyethylene glycol (MIRALAX / GLYCOLAX) 17 g packet Take 8.5 g by mouth daily.     Probiotic Product (PROBIOTIC PO) Take 2 capsules by mouth daily.     rosuvastatin  (CRESTOR ) 5 MG tablet Take 1 tablet (5 mg total) by mouth daily. 90 tablet 3   traMADol  (ULTRAM ) 50  MG tablet Take 50 mg by mouth 4 (four) times daily as needed for moderate pain (pain score 4-6).     albuterol  (VENTOLIN  HFA) 108 (90 Base) MCG/ACT inhaler Inhale 2 puffs into the lungs every 6 (six) hours as needed for wheezing or shortness of breath. 8 g 0   benzonatate  (TESSALON ) 100 MG capsule Take 1 capsule (100 mg total) by mouth 3 (three) times daily as needed for cough. (Patient not taking: Reported on 05/20/2024) 30 capsule 0   meloxicam (MOBIC) 15 MG tablet Take 15 mg by mouth as needed for pain.     Pramoxine-HC (HYDROCORTISONE  ACE-PRAMOXINE) 2.5-1 %  CREA Apply 1 Application topically 2 (two) times daily as needed. (Patient not taking: Reported on 05/20/2024) 30 g 2    Past Medical History:  Diagnosis Date   Allergy    Anxiety    Arthritis    Asthma    seasonal   Cancer [facility])    prostate cancer   Cataract    beginning   Colon polyps    Depression    GERD (gastroesophageal reflux disease)    Hematuria 10/05/2013   Hemorrhoids    Hyperlipidemia    Hypertension    IBS (irritable bowel syndrome)    alt diarrhea/ constipation   Left kidney mass    PONV (postoperative nausea and vomiting)    Rosacea     Past Surgical History:  Procedure Laterality Date   cataract surgery      COLONOSCOPY  07/28/2006   LYMPH NODE DISSECTION Bilateral 09/23/2021   Procedure: PELVIC LYMPH NODE DISSECTION;  Surgeon: Cam Morene ORN, MD;  Location: [facility];  Service: Urology;  Laterality: Bilateral;   ROBOT ASSISTED LAPAROSCOPIC RADICAL PROSTATECTOMY N/A 09/23/2021   Procedure: XI ROBOTIC ASSISTED LAPAROSCOPIC RADICAL PROSTATECTOMY;  Surgeon: Cam Morene ORN, MD;  Location: [facility];  Service: Urology;  Laterality: N/A;  4.5 HRS   URETHRAL SLING N/A 02/21/2023   Procedure: INSERTION OF MALE SLING;  Surgeon: Lovie Arlyss CROME, MD;  Location: [facility];  Service: Urology;  Laterality: N/A;  90 MINUTES NEEDED FOR CASE    Social History   Tobacco Use   Smoking status: Former    Current packs/day: 0.00    Average packs/day: 0.8 packs/day for 4.0 years (3.0 ttl pk-yrs)    Types: Cigarettes    Start date: 08/01/1969    Quit date: 08/01/1973    Years since quitting: 50.8   Smokeless tobacco: Never  Vaping Use   Vaping status: Never Used  Substance Use Topics   Alcohol use: Yes    Alcohol/week: 0.0 - 3.0 standard drinks of alcohol    Comment: social  1 drink 3-4 times a week   Drug use: No    Family History  Problem Relation Age of Onset   Glaucoma Mother        and father   Coronary artery disease Mother        late 79s-cabg. died at 7    Breast cancer Mother        dx. 60s   Coronary artery disease Father 39       death 58, presumed due to MI as sudden   Stomach cancer Maternal Uncle    Breast cancer Paternal Aunt    Prostate cancer Paternal Grandfather        dx. 80s   Colon cancer Neg Hx    Colon polyps Neg Hx    Esophageal cancer Neg Hx    Rectal cancer  Neg Hx     PE: Vitals:   05/29/24 0825 05/29/24 0837  BP: (!) 162/89   Pulse: 81   Resp: 17   Temp: 98.3 F (36.8 C)   TempSrc: Oral   SpO2: 96%   Weight:  70.2 kg  Height:  5' 5.75 (1.67 m)   Patient appears to be in no acute distress  patient is alert and oriented x3 Atraumatic normocephalic head No cervical or supraclavicular lymphadenopathy appreciated No increased work of breathing, no audible wheezes/rhonchi Regular sinus rhythm/rate Abdomen is soft, nontender, nondistended, no CVA or suprapubic tenderness Lower extremities are symmetric without appreciable edema Grossly neurologically intact No identifiable skin lesions  No results for input(s): WBC, HGB, HCT in the last 72 hours. No results for input(s): NA, K, CL, CO2, GLUCOSE, BUN, CREATININE, CALCIUM  in the last 72 hours. No results for input(s): LABPT, INR in the last 72 hours. No results for input(s): LABURIN in the last 72 hours. Results for orders placed or performed during the hospital encounter of 02/10/23  Urine Culture     Status: None   Collection Time: 02/10/23 10:04 AM   Specimen: Urine, Clean Catch  Result Value Ref Range Status   Specimen Description   Final    URINE, CLEAN CATCH Performed at [facility], [address], [place], [place] [address]    Special Requests   Final    NONE Performed at [facility], [address], [place], [place] [address]    Culture   Final    NO GROWTH Performed at [facility], [address] [place], [place] [address]    Report Status 02/11/2023 FINAL  Final     Imaging: CT A/P:  There is a heterogeneous, moderately enhancing mass centered in the left kidney upper pole measuring at least 5.1 x 6.8 cm, compatible with renal cell carcinoma, until proven otherwise. No metastatic disease identified within the abdomen or pelvis.  Imp:  Left renal mass concerning for kidney cancer.  Recommendations: We discussed the situation and the findings together and I recommended that we remove his kidney.   Ok to proceed with left radical nephrectomy.  Morene LELON Salines

## 2024-05-29 NOTE — Transfer of Care (Signed)
 Immediate Anesthesia Transfer of Care Note  Patient: Tyler [name]  Procedure(s) Performed: NEPHRECTOMY, RADICAL, LAPAROSCOPIC, ADULT (Left)  Patient Location: PACU  Anesthesia Type:General  Level of Consciousness: drowsy and patient cooperative  Airway & Oxygen Therapy: Patient Spontanous Breathing and Patient connected to face mask oxygen  Post-op Assessment: Report given to RN and Post -op Vital signs reviewed and stable  Post vital signs: Reviewed and stable  Last Vitals:  Vitals Value Taken Time  BP 157/93 05/29/24 14:21  Temp    Pulse 102 05/29/24 14:25  Resp 24 05/29/24 14:25  SpO2 100 % 05/29/24 14:25  Vitals shown include unfiled device data.  Last Pain:  Vitals:   05/29/24 0900  TempSrc:   PainSc: 0-No pain         Complications: No notable events documented.

## 2024-05-29 NOTE — Discharge Instructions (Signed)
  Driving:  It is against the law to drive when taking narcotic pain medications.  You should wait at least 8 hours after taking your last pain pill before driving.  Further, you should not drive if you are to sore to react quickly or if you have something impeding your ability to drive.   Activity:  You are encouraged to ambulate frequently (about every hour during waking hours) to help prevent blood clots from forming in your legs or lungs.  However, you should not engage in any heavy lifting (> 10-15 lbs), strenuous activity, or straining.  Diet: You should advance your diet as instructed by your physician.  It will be normal to have some bloating, nausea, and abdominal discomfort intermittently.  Prescriptions:  You will be provided a prescription for pain medication to take as needed.  If your pain is not severe enough to require the prescription pain medication, you may take extra strength Tylenol  instead which will have less side effects.  You should also take a prescribed stool softener to avoid straining with bowel movements as the prescription pain medication may constipate you.  Incisions: You may remove your dressing bandages 48 hours after surgery if not removed in the hospital.  You will either have some small staples or special tissue glue at each of the incision sites. Once the bandages are removed (if present), the incisions may stay open to air.  You may start showering (but not soaking or bathing in water) the 2nd day after surgery and the incisions simply need to be patted dry after the shower.  No additional care is needed.  What to call us  about: You should call the office [phone]) if you develop fever > 101 or develop persistent vomiting. Activity:  You are encouraged to ambulate frequently (about every hour during waking hours) to help prevent blood clots from forming in your legs or lungs.  However, you should not engage in any heavy lifting (> 10-15 lbs), strenuous activity,  or straining.

## 2024-05-29 NOTE — Anesthesia Procedure Notes (Signed)
 Procedure Name: Intubation Date/Time: 05/29/2024 11:04 AM  Performed by: Augusta Daved SAILOR, CRNAPre-anesthesia Checklist: Patient identified, Emergency Drugs available, Suction available and Patient being monitored Patient Re-evaluated:Patient Re-evaluated prior to induction Oxygen Delivery Method: Circle System Utilized Preoxygenation: Pre-oxygenation with 100% oxygen Induction Type: IV induction Ventilation: Mask ventilation without difficulty Laryngoscope Size: Mac and 4 Grade View: Grade II Tube type: Oral Number of attempts: 1 Airway Equipment and Method: Stylet and Oral airway Placement Confirmation: ETT inserted through vocal cords under direct vision, positive ETCO2 and breath sounds checked- equal and bilateral Secured at: 22 (at the lip) cm Tube secured with: Tape Dental Injury: Teeth and Oropharynx as per pre-operative assessment  Comments: DL/intubation by Dr. Leonce and Recardo Ahle, med student

## 2024-05-29 NOTE — Anesthesia Preprocedure Evaluation (Addendum)
 Anesthesia Evaluation  Patient identified by MRN, date of birth, ID band Patient awake    Reviewed: Allergy & Precautions, NPO status , Patient's Chart, lab work & pertinent test results  History of Anesthesia Complications (+) PONV  Airway Mallampati: I  TM Distance: >3 FB Neck ROM: Full    Dental  (+) Dental Advisory Given   Pulmonary former smoker   breath sounds clear to auscultation       Cardiovascular hypertension, Pt. on medications and Pt. on home beta blockers (-) angina  Rhythm:Regular Rate:Normal     Neuro/Psych   Anxiety Depression    negative neurological ROS     GI/Hepatic Neg liver ROS,GERD  Medicated and Controlled,,  Endo/Other  negative endocrine ROS    Renal/GU Renal cancer     Musculoskeletal  (+) Arthritis ,    Abdominal   Peds  Hematology Hb 17.0, plt 344k   Anesthesia Other Findings Prostate cancer  Reproductive/Obstetrics                              Anesthesia Physical Anesthesia Plan  ASA: 2  Anesthesia Plan: General   Post-op Pain Management: Tylenol  PO (pre-op)*   Induction: Intravenous  PONV Risk Score and Plan: 3 and Ondansetron , Dexamethasone  and Scopolamine  patch - Pre-op  Airway Management Planned: Oral ETT  Additional Equipment: None  Intra-op Plan:   Post-operative Plan: Extubation in OR  Informed Consent: I have reviewed the patients History and Physical, chart, labs and discussed the procedure including the risks, benefits and alternatives for the proposed anesthesia with the patient or authorized representative who has indicated his/her understanding and acceptance.     Dental advisory given  Plan Discussed with: CRNA and Surgeon  Anesthesia Plan Comments:          Anesthesia Quick Evaluation

## 2024-05-29 NOTE — Anesthesia Postprocedure Evaluation (Signed)
 Anesthesia Post Note  Patient: Tyler [name]  Procedure(s) Performed: NEPHRECTOMY, RADICAL, LAPAROSCOPIC, ADULT (Left)     Patient location during evaluation: PACU Anesthesia Type: General Level of consciousness: awake and alert, oriented and patient cooperative Pain management: pain level controlled Vital Signs Assessment: post-procedure vital signs reviewed and stable Respiratory status: spontaneous breathing, nonlabored ventilation and respiratory function stable Cardiovascular status: blood pressure returned to baseline and stable Postop Assessment: no apparent nausea or vomiting Anesthetic complications: no   No notable events documented.  Last Vitals:  Vitals:   05/29/24 1515 05/29/24 1530  BP: (!) 158/84 (!) 159/91  Pulse: 88 93  Resp: 17 20  Temp: 37.1 C   SpO2: 95% 99%    Last Pain:  Vitals:   05/29/24 1530  TempSrc:   PainSc: 3                  Omeed Osuna,E. Kazue Cerro

## 2024-05-29 NOTE — Op Note (Addendum)
 Preoperative diagnosis:  Light renal mass   Postoperative diagnosis:  same   Procedure: Laparoscopic left radical nephrectomy  Surgeon: Morene MICAEL Salines, MD Resident Assistant: Maurilio Agar, MD   Anesthesia: General  Complications: None  Intraoperative findings:  - single artery and vein, stapled separately - gonadal vein ligated  - left kidney resected en bloc with sparing of left adrenal gland   EBL: 50 cc   Specimens:  Left kidney and proximal ureter  Indication: Tyler [name] is a 68 y.o. patient with a large left renal mass.  After reviewing the management options for treatment, he elected to proceed with the above surgical procedure(s). We have discussed the potential benefits and risks of the procedure, side effects of the proposed treatment, the likelihood of the patient achieving the goals of the procedure, and any potential problems that might occur during the procedure or recuperation. Informed consent has been obtained.  Description of procedure:  A site was selected lateral to the umbilicus for placement of the camera port. This was placed using a visiport and pneumoperitoneum was established. The camera was then used to inspect the abdomen and there was no evidence of any intra-abdominal injuries or other abnormalities. The remaining abdominal ports were then placed. One 5 mm trocar was placed subcostal margin in the left upper quadrant, the second 5 mm trocar was placed laterally to the camera port so as to triangulate the kidney. An assistant port was then placed in between the camera and the left lateral port. The assistant port was a 12 mm port.   The white line of Toldt was incised allowing the colon to be mobilized medially and the plane between the mesocolon and the anterior layer of Gerota's fascia to be developed and the kidney exposed. The ureter and gonadal vein were identified inferiorly and the ureter was lifted anteriorly off the psoas muscle. The  gonadal vein was then dissected out inferior to the lower pole and ligated using a Harmonic Scalpel. Dissection proceeded superiorly along the gonadal vein until the renal vein was identified. The renal hilum was then carefully isolated with a combination of blunt and sharp dissectiong allowing the renal arterial and venous structures to be separated and isolated.   The renal artery was isolated and ligated with a 45 mm Flex ETS stapler. The renal vein was then isolated and also ligated and divided with a 45 mm Flex ETS stapler.  Gerota's fascia was intentionally entered superiorly and the space between the adrenal gland and the kidney was developed allowing the adrenal gland to be spared. The ureter was transected placing 1 clip on the stay side. The lateral attachments of the kidney were then freed. Our attention was then turned to the upper pole which was dissected off of the spleen and the splenic attachments. The pancreas and colon were noted to be well away from the structures. The remaining of the posterior aspect of the attachments was then ligated using a Harmonic Scalpel. Once the kidney was freed from its attachments it was shown to medially and the vascular hilum was inspected and noted to be sufficiently hemostatic. There was slight bit of oozing from the adrenal gland which was cauterized and then Surgicel placed over top. Insufflation was then turned down to 8 mm mercury and the kidney bed was noted to be hemostatic.   80cc of Exparel  was then injected into the left anterior axillary line b/w the iliac crest and the twelfth rib under laparoscopic guidance. The layer between  the tranversus abdominus and the internal oblique was targeted.   The kidney/ureter specimen was then placed into a 12 mm Endocatch II retrieval bag, this was passed through the port site of the assistant port and left lower quadrant. The trochars were then removed under visual guidance to ensure no ongoing port site  bleeding was occurring. The extraction incision was extended from the 12 mm left lower quadrant port to include the 5 mm LLQ assistant port. The external oblique and internal oblique muscles were spread as best as possible with as little muscle fibers ligated as possible in order to safely extracted the specimen. The peritoneum and internal oblique flash of was then closed with 2-0 Vicryl in a running fashion. The external oblique fascia was closed with two 0 PDS's, which were run medially from the apices and tied in the middle of the incision. The camera port was then closed with 2-0 Vicryl the level of the fascia. All incisions were injected with Exparel  and reapproximated at the skin with 4-0 monocryl sutures. Dermabond was applied to the skin. The patient tolerated the procedure well and without complications and was transferred to the recovery unit in satisfactory condition.

## 2024-05-29 NOTE — Plan of Care (Incomplete)
  Problem: Education: Goal: Knowledge of General Education information will improve Description: Including pain rating scale, medication(s)/side effects and non-pharmacologic comfort measures Outcome: Progressing   Problem: Health Behavior/Discharge Planning: Goal: Ability to manage health-related needs will improve Outcome: Progressing   Problem: Clinical Measurements: Goal: Ability to maintain clinical measurements within normal limits will improve Outcome: Progressing Goal: Will remain free from infection Outcome: Progressing   Problem: Activity: Goal: Risk for activity intolerance will decrease Outcome: Progressing   Problem: Clinical Measurements: Goal: Diagnostic test results will improve Outcome: Adequate for Discharge Goal: Respiratory complications will improve Outcome: Adequate for Discharge Goal: Cardiovascular complication will be avoided Outcome: Adequate for Discharge   Problem: Nutrition: Goal: Adequate nutrition will be maintained Outcome: Adequate for Discharge   Problem: Coping: Goal: Level of anxiety will decrease Outcome: Adequate for Discharge

## 2024-05-30 ENCOUNTER — Encounter (HOSPITAL_COMMUNITY): Payer: Self-pay | Admitting: Urology

## 2024-05-30 ENCOUNTER — Observation Stay (HOSPITAL_COMMUNITY)

## 2024-05-30 DIAGNOSIS — I10 Essential (primary) hypertension: Secondary | ICD-10-CM | POA: Diagnosis present

## 2024-05-30 DIAGNOSIS — Z860100 Personal history of colon polyps, unspecified: Secondary | ICD-10-CM | POA: Diagnosis not present

## 2024-05-30 DIAGNOSIS — E785 Hyperlipidemia, unspecified: Secondary | ICD-10-CM | POA: Diagnosis present

## 2024-05-30 DIAGNOSIS — Z83511 Family history of glaucoma: Secondary | ICD-10-CM | POA: Diagnosis not present

## 2024-05-30 DIAGNOSIS — Z800 Family history of malignant neoplasm of digestive organs: Secondary | ICD-10-CM | POA: Diagnosis not present

## 2024-05-30 DIAGNOSIS — M549 Dorsalgia, unspecified: Secondary | ICD-10-CM | POA: Diagnosis present

## 2024-05-30 DIAGNOSIS — Z79899 Other long term (current) drug therapy: Secondary | ICD-10-CM | POA: Diagnosis not present

## 2024-05-30 DIAGNOSIS — Z8616 Personal history of COVID-19: Secondary | ICD-10-CM | POA: Diagnosis not present

## 2024-05-30 DIAGNOSIS — N393 Stress incontinence (female) (male): Secondary | ICD-10-CM | POA: Diagnosis present

## 2024-05-30 DIAGNOSIS — Z91014 Allergy to mammalian meats: Secondary | ICD-10-CM | POA: Diagnosis not present

## 2024-05-30 DIAGNOSIS — D49512 Neoplasm of unspecified behavior of left kidney: Secondary | ICD-10-CM | POA: Diagnosis present

## 2024-05-30 DIAGNOSIS — K219 Gastro-esophageal reflux disease without esophagitis: Secondary | ICD-10-CM | POA: Diagnosis present

## 2024-05-30 DIAGNOSIS — Z8249 Family history of ischemic heart disease and other diseases of the circulatory system: Secondary | ICD-10-CM | POA: Diagnosis not present

## 2024-05-30 DIAGNOSIS — Z803 Family history of malignant neoplasm of breast: Secondary | ICD-10-CM | POA: Diagnosis not present

## 2024-05-30 DIAGNOSIS — Z888 Allergy status to other drugs, medicaments and biological substances status: Secondary | ICD-10-CM | POA: Diagnosis not present

## 2024-05-30 DIAGNOSIS — C642 Malignant neoplasm of left kidney, except renal pelvis: Secondary | ICD-10-CM | POA: Diagnosis present

## 2024-05-30 DIAGNOSIS — Z91013 Allergy to seafood: Secondary | ICD-10-CM | POA: Diagnosis not present

## 2024-05-30 DIAGNOSIS — Z8042 Family history of malignant neoplasm of prostate: Secondary | ICD-10-CM | POA: Diagnosis not present

## 2024-05-30 DIAGNOSIS — Z87891 Personal history of nicotine dependence: Secondary | ICD-10-CM | POA: Diagnosis not present

## 2024-05-30 DIAGNOSIS — Z880 Allergy status to penicillin: Secondary | ICD-10-CM | POA: Diagnosis not present

## 2024-05-30 DIAGNOSIS — N400 Enlarged prostate without lower urinary tract symptoms: Secondary | ICD-10-CM | POA: Diagnosis present

## 2024-05-30 DIAGNOSIS — Z8546 Personal history of malignant neoplasm of prostate: Secondary | ICD-10-CM | POA: Diagnosis not present

## 2024-05-30 LAB — CBC
HCT: 49.5 % (ref 39.0–52.0)
Hemoglobin: 16 g/dL (ref 13.0–17.0)
MCH: 29.3 pg (ref 26.0–34.0)
MCHC: 32.3 g/dL (ref 30.0–36.0)
MCV: 90.5 fL (ref 80.0–100.0)
Platelets: 270 K/uL (ref 150–400)
RBC: 5.47 MIL/uL (ref 4.22–5.81)
RDW: 13.4 % (ref 11.5–15.5)
WBC: 17.8 K/uL — ABNORMAL HIGH (ref 4.0–10.5)
nRBC: 0 % (ref 0.0–0.2)

## 2024-05-30 LAB — BASIC METABOLIC PANEL WITH GFR
Anion gap: 12 (ref 5–15)
BUN: 13 mg/dL (ref 8–23)
CO2: 26 mmol/L (ref 22–32)
Calcium: 9.6 mg/dL (ref 8.9–10.3)
Chloride: 104 mmol/L (ref 98–111)
Creatinine, Ser: 1.49 mg/dL — ABNORMAL HIGH (ref 0.61–1.24)
GFR, Estimated: 50 mL/min — ABNORMAL LOW (ref >60)
Glucose, Bld: 111 mg/dL — ABNORMAL HIGH (ref 70–99)
Potassium: 4.1 mmol/L (ref 3.5–5.1)
Sodium: 141 mmol/L (ref 135–145)

## 2024-05-30 MED ORDER — BACLOFEN 10 MG PO TABS
5.0000 mg | ORAL_TABLET | Freq: Once | ORAL | Status: AC
  Administered 2024-05-30: 5 mg via ORAL
  Filled 2024-05-30: qty 1

## 2024-05-30 MED ORDER — SIMETHICONE 80 MG PO CHEW
80.0000 mg | CHEWABLE_TABLET | Freq: Four times a day (QID) | ORAL | Status: DC | PRN
  Administered 2024-05-30 (×3): 80 mg via ORAL
  Filled 2024-05-30 (×3): qty 1

## 2024-05-30 MED ORDER — SODIUM CHLORIDE 0.9 % IV SOLN: INTRAVENOUS | Status: DC

## 2024-05-30 NOTE — Progress Notes (Addendum)
 Called On call MD, pt c/o of hiccups for the past 3 hrs.Pt unable to sleep and causing increase discomfort at surgical site. MD made aware and waiting for additional orders. SRP, RN

## 2024-05-30 NOTE — Addendum Note (Signed)
 Addendum  created 05/30/24 0754 by Augusta Daved SAILOR, CRNA   Flowsheet accepted

## 2024-05-30 NOTE — Progress Notes (Addendum)
 Simethicone given as ordered and KUB ordered. SRP, RN

## 2024-05-30 NOTE — Discharge Summary (Signed)
 Date of admission: 05/29/2024  Date of discharge: 05/30/2024  Admission diagnosis: left renal mass  Discharge diagnosis: left renal mass  History and Physical: For full details, please see admission history and physical. Briefly, Tyler [name] is a 69 y.o. year old patient with a large left renal mass.   Hospital Course: Pt underwent left laparoscopic nephrectomy on 05/29/24. The patient tolerated the procedure well and went on to have an uneventful post-operative course.   On post-op day 1, the patient passed a voiding trial after foley removal. He was ambulating independently and tolerating a regular diet and was deemed appropriate for discharge later that day. The patient will follow-up in 2 weeks with Dr. Cam.   PE on day of discharge: NAD Non-labored breathing Abdomen soft, incisions c/d/I Extremities are symmetric  Laboratory values:  Recent Labs    05/30/24 0433  HGB 16.0  HCT 49.5   Recent Labs    05/30/24 0433  CREATININE 1.49*    Disposition: Home  Discharge instruction: The patient was instructed to be ambulatory but told to refrain from heavy lifting, strenuous activity, or driving while taking narcotics for pain.   Discharge medications:  Allergies as of 05/30/2024       Reactions   Penicillins    Mouth sores   Porcine (pork) Protein-containing Drug Products    religious   Shellfish Allergy    Religuos        Medication List     STOP taking these medications    meloxicam 15 MG tablet Commonly known as: MOBIC       TAKE these medications    albuterol  108 (90 Base) MCG/ACT inhaler Commonly known as: VENTOLIN  HFA Inhale 2 puffs into the lungs every 6 (six) hours as needed for wheezing or shortness of breath.   ALPRAZolam  0.25 MG tablet Commonly known as: XANAX  Take 1 tablet (0.25 mg total) by mouth 2 (two) times daily as needed for anxiety (do not drive for 6-8 hours after taking).   amitriptyline 25 MG tablet Commonly known  as: ELAVIL Take 25 mg by mouth at bedtime as needed for sleep.   benzonatate  100 MG capsule Commonly known as: TESSALON  Take 1 capsule (100 mg total) by mouth 3 (three) times daily as needed for cough.   bisoprolol  5 MG tablet Commonly known as: ZEBETA  TAKE 1 TABLET BY MOUTH DAILY What changed: how much to take   busPIRone  5 MG tablet Commonly known as: BUSPAR  TAKE 1 TABLET BY MOUTH 3 TIMES A DAY   diphenhydramine -acetaminophen  25-500 MG Tabs tablet Commonly known as: TYLENOL  PM Take 1-2 tablets by mouth at bedtime.   famotidine 40 MG tablet Commonly known as: Pepcid Take 1 tablet (40 mg total) by mouth at bedtime.   FIBER PO Take 3 tablets by mouth daily.   Hydrocortisone  Ace-Pramoxine 2.5-1 % Crea Apply 1 Application topically 2 (two) times daily as needed.   mometasone 0.1 % cream Commonly known as: ELOCON Apply 1 Application topically daily as needed (eczema).   omeprazole  40 MG capsule Commonly known as: PRILOSEC Take 1 capsule (40 mg total) by mouth daily.   Optase Comfort Dry Eye 1 % Soln Generic drug: Glycerin (PF) Place 1 drop into both eyes daily as needed (dry eyes).   polyethylene glycol 17 g packet Commonly known as: MIRALAX / GLYCOLAX Take 8.5 g by mouth daily.   PROBIOTIC PO Take 2 capsules by mouth daily.   promethazine  12.5 MG tablet Commonly known as: PHENERGAN  Take 1  tablet (12.5 mg total) by mouth every 6 (six) hours as needed for nausea or vomiting.   rosuvastatin  5 MG tablet Commonly known as: CRESTOR  Take 1 tablet (5 mg total) by mouth daily.   traMADol  50 MG tablet Commonly known as: ULTRAM  Take 1-2 tablets (50-100 mg total) by mouth 4 (four) times daily as needed for moderate pain (pain score 4-6). What changed: how much to take        Followup:   Follow-up Information     Buck Rodena BIRCH, NP Follow up on 06/12/2024.   Specialty: Urology Why: [place] information: [address] [place] [place]  [address] [phone]

## 2024-05-30 NOTE — Progress Notes (Signed)
 Foley d/c'd 0600 due to void.SRP, RN

## 2024-05-31 ENCOUNTER — Telehealth: Payer: Self-pay | Admitting: *Deleted

## 2024-05-31 MED ORDER — ONDANSETRON 4 MG PO TBDP: 4.0000 mg | ORAL_TABLET | Freq: Three times a day (TID) | ORAL | 0 refills | Status: AC | PRN

## 2024-05-31 NOTE — Transitions of Care (Post Inpatient/ED Visit) (Signed)
 05/31/2024  Name: Tyler [name] MRN: [id number] DOB: 1956/05/24  Today's TOC FU Call Status: Today's TOC FU Call Status:: Successful TOC FU Call Completed TOC FU Call Complete Date: 05/31/24 Patient's Name and Date of Birth confirmed.  Transition Care Management Follow-up Telephone Call Date of Discharge: 05/30/24 Discharge Facility: [facility]) Type of Discharge: Inpatient Admission Primary Inpatient Discharge Diagnosis:: s/p left nephrectomy How have you been since you were released from the hospital?: Same (pt states  still have some nausea and vomiting  keeping down gatorade and water ,  no issues with ambulation, taking miralax, last BM 05/28/24) Any questions or concerns?: Yes Patient Questions/Concerns:: would like something different to take for nausea Patient Questions/Concerns Addressed: Notified Provider of Patient Questions/Concerns  Items Reviewed: Did you receive and understand the discharge instructions provided?: Yes Medications obtained,verified, and reconciled?: Yes (Medications Reviewed) Any new allergies since your discharge?: No Dietary orders reviewed?: Yes Type of Diet Ordered:: heart healthy Do you have support at home?: Yes People in Home [RPT]: spouse Name of Support/Comfort Primary Source: Niels Aver  Medications Reviewed Today: Medications Reviewed Today     Reviewed by Aura Mliss LABOR, RN (Registered Nurse) on 05/31/24 at 1028  Med List Status: <None>   Medication Order Taking? Sig Documenting Provider Last Dose Status Informant  albuterol  (VENTOLIN  HFA) 108 (90 Base) MCG/ACT inhaler [id number] Yes Inhale 2 puffs into the lungs every 6 (six) hours as needed for wheezing or shortness of breath. Gladis Elsie BROCKS, PA-C  Active Self  ALPRAZolam  (XANAX ) 0.25 MG tablet [id number] Yes Take 1 tablet (0.25 mg total) by mouth 2 (two) times daily as needed for anxiety (do not drive for 6-8 hours after taking). Katrinka Garnette KIDD, MD  Active Self   amitriptyline (ELAVIL) 25 MG tablet [id number] Yes Take 25 mg by mouth at bedtime as needed for sleep. [provider]  Active Self  benzonatate  (TESSALON ) 100 MG capsule [id number]  Take 1 capsule (100 mg total) by mouth 3 (three) times daily as needed for cough.  Patient not taking: Reported on 05/31/2024   Katrinka Garnette KIDD, MD  Active Self  bisoprolol  (ZEBETA ) 5 MG tablet [id number] Yes TAKE 1 TABLET BY MOUTH DAILY Katrinka Garnette KIDD, MD  Active Self  busPIRone  (BUSPAR ) 5 MG tablet [id number] Yes TAKE 1 TABLET BY MOUTH 3 TIMES A DAY Katrinka Garnette KIDD, MD  Active Self  diphenhydramine -acetaminophen  (TYLENOL  PM) 25-500 MG TABS tablet [id number] Yes Take 1-2 tablets by mouth at bedtime. [provider]  Active Self  famotidine (PEPCID) 40 MG tablet [id number] Yes Take 1 tablet (40 mg total) by mouth at bedtime. Craig Alan JONELLE DEVONNA  Active Self  FIBER PO [id number] Yes Take 3 tablets by mouth daily. [provider]  Active Self  Glycerin, PF, (OPTASE COMFORT DRY EYE) 1 % SOLN [id number] Yes Place 1 drop into both eyes daily as needed (dry eyes). [provider]  Active Self  mometasone (ELOCON) 0.1 % cream [id number] Yes Apply 1 Application topically daily as needed (eczema). [provider]  Active Self  omeprazole  (PRILOSEC) 40 MG capsule [id number] Yes Take 1 capsule (40 mg total) by mouth daily. Craig Alan JONELLE, PA-C  Active Self  polyethylene glycol (MIRALAX / GLYCOLAX) 17 g packet [id number] Yes Take 8.5 g by mouth daily. [provider]  Active Self  Pramoxine-HC (HYDROCORTISONE  ACE-PRAMOXINE) 2.5-1 % CREA [id number] Yes Apply 1 Application topically 2 (two) times daily as needed. Katrinka Garnette KIDD, MD  Active Self  Probiotic Product (PROBIOTIC PO) [id number] Yes Take 2 capsules by mouth daily. [provider]  Active Self  promethazine  (PHENERGAN ) 12.5 MG tablet [id number] Yes Take 1 tablet (12.5 mg total) by mouth every 6 (six) hours as  needed for nausea or vomiting. Cam Morene ORN, MD  Active   rosuvastatin  (CRESTOR ) 5 MG tablet [id number] Yes Take 1 tablet (5 mg total) by mouth daily. Katrinka Garnette KIDD, MD  Active Self  traMADol  (ULTRAM ) 50 MG tablet [id number] Yes Take 1-2 tablets (50-100 mg total) by mouth 4 (four) times daily as needed for moderate pain (pain score 4-6). Cam Morene ORN, MD  Active             Home Care and Equipment/Supplies: Were Home Health Services Ordered?: No Any new equipment or medical supplies ordered?: No  Functional Questionnaire: Do you need assistance with bathing/showering or dressing?: No Do you need assistance with meal preparation?: No (spouse is assisting) Do you need assistance with eating?: No Do you have difficulty maintaining continence: No Do you need assistance with getting out of bed/getting out of a chair/moving?: No Do you have difficulty managing or taking your medications?: No  Follow up appointments reviewed: PCP Follow-up appointment confirmed?: No (pt declines for RN CM to schedule appointment) MD Provider Line Number:[phone] Given: No Specialist Hospital Follow-up appointment confirmed?: Yes Date of Specialist follow-up appointment?: 06/12/24 Follow-Up Specialty Provider:: Dr. Rodena Ambrosia  urology Do you need transportation to your follow-up appointment?: No Do you understand care options if your condition(s) worsen?: Yes-patient verbalized understanding  SDOH Interventions Today    Flowsheet Row Most Recent Value  SDOH Interventions   Food Insecurity Interventions Intervention Not Indicated  Housing Interventions Intervention Not Indicated  Transportation Interventions Intervention Not Indicated  Utilities Interventions Intervention Not Indicated    Goals Addressed             This Visit's Progress    VBCI Transitions of Care (TOC) Care Plan       Problems:  Recent Hospitalization for treatment of s/p left nephrectomy No Hospital  Follow Up Provider appointment pt declines for RN CM to schedule post hospital follow up Pt reports he had nausea and vomiting in hospital  some and still have it some at home  phenergan  is not helping much , pt states he has already left a message with [facility] reporting this today and awaiting a phone call back Pt denies any other concerns, has supportive spouse  Goal:  Over the next 30 days, the patient will not experience hospital readmission  Interventions:  Transitions of Care: Doctor Visits  - discussed the importance of doctor visits Contacted provider for patient needs in basket message sent to surgeon (urology) Dr. Morene Cam reporting pt is having some nausea and vomiting, is able to keep down gatorade and water , phenergan  is not helping much, ask if alternative medication such as zofran  can be called into patient's pharmacy Post discharge activity limitations prescribed by provider reviewed Post-op wound/incision care reviewed with patient/caregiver Reviewed Signs and symptoms of infection Evaluation of current treatment plan related to s/p left nephrectomy,  self-management and patient's adherence to plan as established by provider. Discussed plans with patient for ongoing care management follow up and provided patient with direct contact information for care management team Reviewed medications with patient and discussed importance of taking as prescribed Discussed plans with patient for ongoing care management follow up and provided patient with direct contact information for care management team Screening for  signs and symptoms of depression related to chronic disease state  Assessed social determinant of health barriers Reviewed all upcoming scheduled appointments including urology Dr. Rodena O'Berry 06/12/24 @ 11 am  Patient Self Care Activities:  Attend all scheduled provider appointments Call pharmacy for medication refills 3-7 days in advance of running  out of medications Call provider office for new concerns or questions  Notify RN Care Manager of TOC call rescheduling needs Participate in Transition of Care Program/Attend TOC scheduled calls Take medications as prescribed   Report any signs of infection to your provider Please stay in touch with urology about any continued nausea and vomiting or any other concerns  Plan:  Telephone follow up appointment with care management team member scheduled for:  06/06/24 @ 1130 am The patient has been provided with contact information for the care management team and has been advised to call with any health related questions or concerns.         Mliss Creed [facility], BSN RN Care Manager/ Transition of Care [place]/ [facility] [phone]

## 2024-05-31 NOTE — Patient Outreach (Signed)
 05/31/2024  Patient ID: Tyler [name], male   DOB: [birth year], 68 y.o.   MRN: [id number]  Telephone call to [facility], held for over 10 minutes to speak with someone in Triage, unable to speak with anyone or leave a voicemail.   In basket message sent to primary care provider reporting nausea and vomiting and phenergan  not helping much, difficulty getting through to urologist, request made for alternative medication such as zofran  be called into patient's pharmacy.  Called back to try and go through scheduling, spoke with front desk, transferred to Triage, Held for over 15 minutes, unable to speak with anyone or leave a voicemail,  (in basket was previously sent to Dr. Cam earlier today).  Mliss Creed [facility], BSN RN Care Manager/ Transition of Care [place]/ [facility] [phone]

## 2024-06-01 LAB — SURGICAL PATHOLOGY

## 2024-06-03 ENCOUNTER — Telehealth: Payer: Self-pay | Admitting: *Deleted

## 2024-06-03 NOTE — Telephone Encounter (Signed)
 Which labs would you like ordered and I will schedule the patient.

## 2024-06-03 NOTE — Patient Outreach (Signed)
 06/03/2024  Patient ID: Tyler [name], male   DOB: [birth year], 68 y.o.   MRN: [id number]  Spoke with pt who reports he did pickup Zofran  from pharmacy and has been taking over the weekend, pt reports relief with Zofran , has been able to eat and drink more.  No new concerns reported.  Mliss Creed [facility], BSN RN Care Manager/ Transition of Care / [facility] [phone]

## 2024-06-04 ENCOUNTER — Other Ambulatory Visit: Payer: Self-pay | Admitting: Family Medicine

## 2024-06-04 DIAGNOSIS — I10 Essential (primary) hypertension: Secondary | ICD-10-CM

## 2024-06-06 ENCOUNTER — Other Ambulatory Visit: Payer: Self-pay | Admitting: *Deleted

## 2024-06-06 ENCOUNTER — Other Ambulatory Visit: Payer: Self-pay | Admitting: Physician Assistant

## 2024-06-06 ENCOUNTER — Other Ambulatory Visit: Payer: Self-pay | Admitting: Family Medicine

## 2024-06-06 NOTE — Patient Instructions (Signed)
 Visit Information  Thank you for taking time to visit with me today. Please don't hesitate to contact me if I can be of assistance to you before our next scheduled telephone appointment.  Following is a copy of your care plan:   Goals Addressed             This Visit's Progress    COMPLETED: VBCI Transitions of Care (TOC) Care Plan       Problems:  Recent Hospitalization for treatment of s/p left nephrectomy No Hospital Follow Up Provider appointment pt declines for RN CM to schedule post hospital follow up Pt reports he had nausea and vomiting in hospital  some and still have it some at home  phenergan  is not helping much , pt states he has already left a message with [facility] reporting this today and awaiting a phone call back Pt denies any other concerns, has supportive spouse 06/06/24- pt reports  doing well   no new concerns reported,  nausea is resolved, pt does not feel he needs further follow up  Goal:  Over the next 30 days, the patient will not experience hospital readmission  Interventions:  Transitions of Care: Doctor Visits  - discussed the importance of doctor visits Contacted provider for patient needs in basket message sent to surgeon (urology) Dr. Morene Salines reporting pt is having some nausea and vomiting, is able to keep down gatorade and water , phenergan  is not helping much, ask if alternative medication such as zofran  can be called into patient's pharmacy Post discharge activity limitations prescribed by provider reviewed Post-op wound/incision care reviewed with patient/caregiver Reviewed Signs and symptoms of infection Evaluation of current treatment plan related to s/p left nephrectomy,  self-management and patient's adherence to plan as established by provider. Discussed plans with patient for ongoing care management follow up and provided patient with direct contact information for care management team Reviewed medications with patient and  discussed importance of taking as prescribed Discussed plans with patient for ongoing care management follow up and provided patient with direct contact information for care management team Reviewed all upcoming scheduled appointments including urology Dr. Rodena O'Berry 06/12/24 @ 11 am,   06/18/24  oncology Reviewed plan of care with pt including TOC case closure today  Patient Self Care Activities:  Attend all scheduled provider appointments Call pharmacy for medication refills 3-7 days in advance of running out of medications Call provider office for new concerns or questions  Notify RN Care Manager of TOC call rescheduling needs Participate in Transition of Care Program/Attend TOC scheduled calls Take medications as prescribed   Report any signs of infection to your provider  Plan:  No further follow up required: TOC case closure, pt does not feel he needs further follow up The patient has been provided with contact information for the care management team and has been advised to call with any health related questions or concerns.         Care plan and visit instructions communicated with the patient verbally today. Patient agrees to receive a copy in MyChart. Active MyChart status and patient understanding of how to access instructions and care plan via MyChart confirmed with patient.     No further follow up required: case closure  Please call the care guide team at [phone] if you need to cancel or reschedule your appointment.   Please call the Suicide and Crisis Lifeline: 988 call the [place]  National Suicide Prevention Lifeline: (641) 681-7305 or TTY: 915 445 5132 TTY 718-588-4185) to talk  to a trained counselor call 1-800-273-TALK (toll free, 24 hour hotline) go to [facility] [address] [place] [phone]) call 911 if you are experiencing a Mental Health or Behavioral Health Crisis or need someone to talk to.  Mliss Creed  [facility], BSN RN Care Manager/ Transition of Care [place]/ [facility] [phone]

## 2024-06-06 NOTE — Patient Outreach (Signed)
 Transition of Care week 2  Visit Note  06/06/2024  Name: Tyler [name] MRN: [id number]          DOB: February 14, 1956  Situation: Patient enrolled in [facility] 30-day program. Visit completed with patient by telephone.   Background:  Discharge Date and Diagnosis: 05/30/24, s/p left nephrectomy   Past Medical History:  Diagnosis Date   Allergy    Anxiety    Arthritis    Asthma    seasonal   Cancer [facility])    prostate cancer   Cataract    beginning   Colon polyps    Depression    GERD (gastroesophageal reflux disease)    Hematuria 10/05/2013   Hemorrhoids    Hyperlipidemia    Hypertension    IBS (irritable bowel syndrome)    alt diarrhea/ constipation   Left kidney mass    PONV (postoperative nausea and vomiting)    Rosacea     Assessment: Patient Reported Symptoms: Cognitive Cognitive Status: No symptoms reported, Able to follow simple commands, Alert and oriented to person, place, and time, Normal speech and language skills      Neurological Neurological Review of Symptoms: No symptoms reported    HEENT HEENT Symptoms Reported: No symptoms reported      Cardiovascular Cardiovascular Symptoms Reported: No symptoms reported    Respiratory Respiratory Symptoms Reported: No symptoms reported    Endocrine Endocrine Symptoms Reported: No symptoms reported Is patient diabetic?: No    Gastrointestinal Gastrointestinal Symptoms Reported: No symptoms reported Gastrointestinal Self-Management Outcome: 4 (good) Gastrointestinal Comment: per pt, nausea has resolved, appetite  much better    Genitourinary Genitourinary Symptoms Reported: No symptoms reported    Integumentary Integumentary Symptoms Reported: Incision Additional Integumentary Details: 3 incisions to left side, WNL per pt Skin Management Strategies: Adequate rest, Routine screening Skin Self-Management Outcome: 4 (good) Skin Comment: reinforced signs/ symptoms of infection  Musculoskeletal Musculoskelatal  Symptoms Reviewed: No symptoms reported        Psychosocial Psychosocial Symptoms Reported: No symptoms reported         There were no vitals filed for this visit.  Medications Reviewed Today     Reviewed by Aura Mliss LABOR, RN (Registered Nurse) on 06/06/24 at 1138  Med List Status: <None>   Medication Order Taking? Sig Documenting Provider Last Dose Status Informant  albuterol  (VENTOLIN  HFA) 108 (90 Base) MCG/ACT inhaler [id number]  Inhale 2 puffs into the lungs every 6 (six) hours as needed for wheezing or shortness of breath. Gladis Elsie BROCKS, PA-C  Active Self  ALPRAZolam  (XANAX ) 0.25 MG tablet [id number]  Take 1 tablet (0.25 mg total) by mouth 2 (two) times daily as needed for anxiety (do not drive for 6-8 hours after taking). Katrinka Garnette KIDD, MD  Active Self  amitriptyline (ELAVIL) 25 MG tablet [id number]  Take 25 mg by mouth at bedtime as needed for sleep. [provider]  Active Self  benzonatate  (TESSALON ) 100 MG capsule [id number]  Take 1 capsule (100 mg total) by mouth 3 (three) times daily as needed for cough.  Patient not taking: Reported on 05/31/2024   Katrinka Garnette KIDD, MD  Active Self  bisoprolol  (ZEBETA ) 5 MG tablet [id number]  TAKE 1 TABLET BY MOUTH DAILY Katrinka Garnette KIDD, MD  Active Self  busPIRone  (BUSPAR ) 5 MG tablet [id number]  TAKE 1 TABLET BY MOUTH 3 TIMES A DAY Katrinka Garnette KIDD, MD  Active Self  diphenhydramine -acetaminophen  (TYLENOL  PM) 25-500 MG TABS tablet [id number]  Take 1-2 tablets by mouth at  bedtime. [provider]  Active Self  famotidine (PEPCID) 40 MG tablet [id number]  Take 1 tablet (40 mg total) by mouth at bedtime. Collier, Amanda R, PA-C  Active Self  FIBER PO [id number]  Take 3 tablets by mouth daily. [provider]  Active Self  Glycerin, PF, (OPTASE COMFORT DRY EYE) 1 % SOLN [id number]  Place 1 drop into both eyes daily as needed (dry eyes). [provider]  Active Self  mometasone (ELOCON) 0.1 % cream [id number]   Apply 1 Application topically daily as needed (eczema). [provider]  Active Self  omeprazole  (PRILOSEC) 40 MG capsule [id number]  Take 1 capsule (40 mg total) by mouth daily. Craig Alan SAUNDERS, PA-C  Active Self  ondansetron  (ZOFRAN -ODT) 4 MG disintegrating tablet [id number]  Take 1 tablet (4 mg total) by mouth every 8 (eight) hours as needed for nausea or vomiting. Katrinka Garnette KIDD, MD  Active   polyethylene glycol (MIRALAX / GLYCOLAX) 17 g packet [id number]  Take 8.5 g by mouth daily. [provider]  Active Self  Pramoxine-HC (HYDROCORTISONE  ACE-PRAMOXINE) 2.5-1 % CREA [id number]  Apply 1 Application topically 2 (two) times daily as needed. Katrinka Garnette KIDD, MD  Active Self  Probiotic Product (PROBIOTIC PO) [id number]  Take 2 capsules by mouth daily. [provider]  Active Self  promethazine  (PHENERGAN ) 12.5 MG tablet [id number]  Take 1 tablet (12.5 mg total) by mouth every 6 (six) hours as needed for nausea or vomiting. Cam Morene ORN, MD  Active   rosuvastatin  (CRESTOR ) 5 MG tablet [id number]  TAKE 1 TABLET BY MOUTH DAILY Katrinka Garnette KIDD, MD  Active   traMADol  (ULTRAM ) 50 MG tablet [id number]  Take 1-2 tablets (50-100 mg total) by mouth 4 (four) times daily as needed for moderate pain (pain score 4-6). Cam Morene ORN, MD  Active             Goals Addressed             This Visit's Progress    COMPLETED: VBCI Transitions of Care (TOC) Care Plan       Problems:  Recent Hospitalization for treatment of s/p left nephrectomy No Hospital Follow Up Provider appointment pt declines for RN CM to schedule post hospital follow up Pt reports he had nausea and vomiting in hospital  some and still have it some at home  phenergan  is not helping much , pt states he has already left a message with [facility] reporting this today and awaiting a phone call back Pt denies any other concerns, has supportive spouse 06/06/24- pt reports  doing well   no new  concerns reported,  nausea is resolved, pt does not feel he needs further follow up  Goal:  Over the next 30 days, the patient will not experience hospital readmission  Interventions:  Transitions of Care: Doctor Visits  - discussed the importance of doctor visits Contacted provider for patient needs in basket message sent to surgeon (urology) Dr. Morene Cam reporting pt is having some nausea and vomiting, is able to keep down gatorade and water , phenergan  is not helping much, ask if alternative medication such as zofran  can be called into patient's pharmacy Post discharge activity limitations prescribed by provider reviewed Post-op wound/incision care reviewed with patient/caregiver Reviewed Signs and symptoms of infection Evaluation of current treatment plan related to s/p left nephrectomy,  self-management and patient's adherence to plan as established by provider. Discussed plans with patient for ongoing care management follow up and provided  patient with direct contact information for care management team Reviewed medications with patient and discussed importance of taking as prescribed Discussed plans with patient for ongoing care management follow up and provided patient with direct contact information for care management team Reviewed all upcoming scheduled appointments including urology Dr. Rodena O'Berry 06/12/24 @ 11 am,   06/18/24  oncology Reviewed plan of care with pt including TOC case closure today  Patient Self Care Activities:  Attend all scheduled provider appointments Call pharmacy for medication refills 3-7 days in advance of running out of medications Call provider office for new concerns or questions  Notify RN Care Manager of TOC call rescheduling needs Participate in Transition of Care Program/Attend TOC scheduled calls Take medications as prescribed   Report any signs of infection to your provider  Plan:  No further follow up required: TOC case closure, pt  does not feel he needs further follow up The patient has been provided with contact information for the care management team and has been advised to call with any health related questions or concerns.         Recommendation:   PCP Follow-up Specialty provider follow-up surgeon, oncology  Follow Up Plan:   Closing From:  Transitions of Care Program  Mliss Creed [facility], BSN RN Care Manager/ Transition of Care [place]/ [facility] [phone]

## 2024-06-14 ENCOUNTER — Other Ambulatory Visit: Payer: Self-pay

## 2024-06-14 DIAGNOSIS — Z905 Acquired absence of kidney: Secondary | ICD-10-CM

## 2024-06-14 NOTE — Progress Notes (Signed)
 RN successfully faxed pathology consult request to [facility] at [phone] for case: [phone].

## 2024-06-17 ENCOUNTER — Other Ambulatory Visit (HOSPITAL_COMMUNITY)

## 2024-06-17 NOTE — Progress Notes (Unsigned)
 [facility] CONSULT NOTE  Patient Care Team: Katrinka Garnette KIDD, MD as PCP - General (Family Medicine) Tyler Morene ORN, MD as Attending Physician (Urology)  ASSESSMENT & PLAN:  Tyler [name] is a 68 y.o.male with history of prostate cancer s/p RP with BPLND, HTN, HLD being seen at [facility].  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 Assessment & Plan   No orders of the defined types were placed in this encounter.   I personally spent a total of *** minutes in the care of the patient today including {Time Based Coding:[id number]}.   All questions were answered. The patient knows to call the clinic with any problems, questions or concerns. No barriers to learning was detected.  Tyler JAYSON Chihuahua, MD 11/17/20259:16 PM  CHIEF COMPLAINTS/PURPOSE OF CONSULTATION:  Left RCC  HISTORY OF PRESENTING ILLNESS:  Tyler [name] 68 y.o. male is here because of left RCC I have reviewed his chart and materials related to his cancer extensively and collaborated history with the patient. Summary of oncologic history is as follows: Oncology History  History of prostate cancer  07/05/2021 Cancer Staging   Staging form: Prostate, AJCC 8th Edition - Clinical stage from 07/05/2021: Stage IIC (cT1c, cN0, cM0, PSA: 5.4, Grade Group: 3) - Signed by Sherwood Rise, PA-C on 08/13/2021 Histopathologic type: Adenocarcinoma, NOS Stage prefix: Initial diagnosis Prostate specific antigen (PSA) range: Less than 10 Gleason primary pattern: 4 Gleason secondary pattern: 3 Gleason score: 7 Histologic grading system: 5 grade system Number of biopsy cores examined: 15 Number of biopsy cores positive: 3 Location of positive needle core biopsies: Both sides   08/13/2021 Initial Diagnosis   Prostate cancer (HCC)    Genetic Testing   [facility] CancerNext Panel+RNA was Negative. Report date is 08/21/2021.  The  CancerNext gene panel offered by [employer] includes sequencing, rearrangement analysis, and RNA analysis for the following 36 genes: APC, ATM, AXIN2, BARD1, BMPR1A, BRCA1, BRCA2, BRIP1, CDH1, CDK4, CDKN2A, CHEK2, DICER1, HOXB13, EPCAM, GREM1, MLH1, MSH2, MSH3, MSH6, MUTYH, NBN, NF1, NTHL1, PALB2, PMS2, POLD1, POLE, PTEN, RAD51C, RAD51D, RECQL, SMAD4, SMARCA4, STK11, and TP53.      MEDICAL HISTORY:  Past Medical History:  Diagnosis Date   Allergy    Anxiety    Arthritis    Asthma    seasonal   Cancer [facility])    prostate cancer   Cataract    beginning   Colon polyps    Depression    GERD (gastroesophageal reflux disease)    Hematuria 10/05/2013   Hemorrhoids    Hyperlipidemia    Hypertension    IBS (irritable bowel syndrome)    alt diarrhea/ constipation   Left kidney mass    PONV (postoperative nausea and vomiting)    Rosacea     SURGICAL HISTORY: Past Surgical History:  Procedure Laterality Date   cataract surgery      COLONOSCOPY  07/28/2006   LAPAROSCOPIC NEPHRECTOMY Left 05/29/2024   Procedure: NEPHRECTOMY, RADICAL, LAPAROSCOPIC, ADULT;  Surgeon: Tyler Morene ORN, MD;  Location: [facility];  Service: Urology;  Laterality: Left;  LEFT LAPAROSCOPIC RADICAL NEPHRECTOMY   LYMPH NODE DISSECTION Bilateral 09/23/2021   Procedure: PELVIC LYMPH NODE DISSECTION;  Surgeon: Tyler Morene ORN, MD;  Location: [facility];  Service: Urology;  Laterality: Bilateral;   ROBOT ASSISTED LAPAROSCOPIC RADICAL PROSTATECTOMY N/A 09/23/2021   Procedure: XI ROBOTIC ASSISTED LAPAROSCOPIC RADICAL PROSTATECTOMY;  Surgeon: Tyler Morene ORN, MD;  Location: [facility];  Service: Urology;  Laterality: N/A;  4.5 HRS   URETHRAL SLING N/A 02/21/2023   Procedure: INSERTION OF MALE SLING;  Surgeon: Lovie Arlyss CROME, MD;  Location: [facility];  Service: Urology;  Laterality: N/A;  90 MINUTES NEEDED FOR CASE    SOCIAL HISTORY: Social History   Socioeconomic History   Marital status: Married    Spouse name: Not  on file   Number of children: Not on file   Years of education: Not on file   Highest education level: Bachelor's degree (e.g., BA, AB, BS)  Occupational History   Not on file  Tobacco Use   Smoking status: Former    Current packs/day: 0.00    Average packs/day: 0.8 packs/day for 4.0 years (3.0 ttl pk-yrs)    Types: Cigarettes    Start date: 08/01/1969    Quit date: 08/01/1973    Years since quitting: 50.9   Smokeless tobacco: Never  Vaping Use   Vaping status: Never Used  Substance and Sexual Activity   Alcohol use: Yes    Alcohol/week: 0.0 - 3.0 standard drinks of alcohol    Comment: social  1 drink 3-4 times a week   Drug use: No   Sexual activity: Yes  Other Topics Concern   Not on file  Social History Narrative   Married (wife plans to transition to me 2020, see mother in law as well Tyler [name]), 3 kids. 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 1 of his children is adopted.       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       Hobbies: biking, cycling, reading, movies   Social Drivers of Health   Financial Resource Strain: Low Risk  (03/06/2024)   Overall Financial Resource Strain (CARDIA)    Difficulty of Paying Living Expenses: Not hard at all  Food Insecurity: No Food Insecurity (06/14/2024)   Hunger Vital Sign    Worried About Running Out of Food in the Last Year: Never true    Ran Out of Food in the Last Year: Never true  Transportation Needs: Unknown (06/14/2024)   PRAPARE - Administrator, Civil Service (Medical): No    Lack of Transportation (Non-Medical): Not on file  Physical Activity: Sufficiently Active (03/06/2024)   Exercise Vital Sign    Days of Exercise per Week: 4 days    Minutes of Exercise per Session: 70 min  Stress: No Stress Concern Present (03/06/2024)   [employer] of Occupational  Health - Occupational Stress Questionnaire    Feeling of Stress: Only a little  Recent Concern: Stress - Stress Concern Present (01/07/2024)   [employer] of Occupational Health - Occupational Stress Questionnaire    Feeling of Stress : To some extent  Social Connections: Socially Integrated (05/29/2024)   Social Connection and Isolation Panel    Frequency of Communication with Friends and Family: More than three times a week    Frequency of Social Gatherings with Friends and Family: Three times a week    Attends Religious Services: More than 4 times per year    Active Member of Clubs or Organizations: Yes    Attends Banker Meetings: More than 4 times per year    Marital Status: Married  Catering Manager Violence: Not At Risk (05/31/2024)   Humiliation, Afraid, Rape, and Kick questionnaire  Fear of Current or Ex-Partner: No    Emotionally Abused: No    Physically Abused: No    Sexually Abused: No    FAMILY HISTORY: Family History  Problem Relation Age of Onset   Glaucoma Mother        and father   Coronary artery disease Mother        late 63s-cabg. died at 49   Breast cancer Mother        dx. 60s   Coronary artery disease Father 100       death 60, presumed due to MI as sudden   Stomach cancer Maternal Uncle    Breast cancer Paternal Aunt    Prostate cancer Paternal Grandfather        dx. 80s   Colon cancer Neg Hx    Colon polyps Neg Hx    Esophageal cancer Neg Hx    Rectal cancer Neg Hx     ALLERGIES:  is allergic to penicillins, porcine (pork) protein-containing drug products, and shellfish allergy.  MEDICATIONS:  Current Outpatient Medications  Medication Sig Dispense Refill   albuterol  (VENTOLIN  HFA) 108 (90 Base) MCG/ACT inhaler Inhale 2 puffs into the lungs every 6 (six) hours as needed for wheezing or shortness of breath. 8 g 0   ALPRAZolam  (XANAX ) 0.25 MG tablet Take 1 tablet (0.25 mg total) by mouth 2 (two) times daily as needed for anxiety  (do not drive for 6-8 hours after taking). 30 tablet 2   amitriptyline (ELAVIL) 25 MG tablet Take 25 mg by mouth at bedtime as needed for sleep.     benzonatate  (TESSALON ) 100 MG capsule Take 1 capsule (100 mg total) by mouth 3 (three) times daily as needed for cough. (Patient not taking: Reported on 05/31/2024) 30 capsule 0   bisoprolol  (ZEBETA ) 5 MG tablet TAKE 1 TABLET BY MOUTH DAILY 45 tablet 3   busPIRone  (BUSPAR ) 5 MG tablet TAKE 1 TABLET BY MOUTH 3 TIMES A DAY 90 tablet 5   diphenhydramine -acetaminophen  (TYLENOL  PM) 25-500 MG TABS tablet Take 1-2 tablets by mouth at bedtime.     famotidine (PEPCID) 40 MG tablet Take 1 tablet (40 mg total) by mouth at bedtime. 30 tablet 3   FIBER PO Take 3 tablets by mouth daily.     Glycerin, PF, (OPTASE COMFORT DRY EYE) 1 % SOLN Place 1 drop into both eyes daily as needed (dry eyes).     mometasone (ELOCON) 0.1 % cream Apply 1 Application topically daily as needed (eczema).     omeprazole  (PRILOSEC) 40 MG capsule TAKE 1 CAPSULE BY MOUTH DAILY 90 capsule 0   ondansetron  (ZOFRAN -ODT) 4 MG disintegrating tablet Take 1 tablet (4 mg total) by mouth every 8 (eight) hours as needed for nausea or vomiting. 20 tablet 0   polyethylene glycol (MIRALAX / GLYCOLAX) 17 g packet Take 8.5 g by mouth daily.     Pramoxine-HC (HYDROCORTISONE  ACE-PRAMOXINE) 2.5-1 % CREA Apply 1 Application topically 2 (two) times daily as needed. 30 g 2   Probiotic Product (PROBIOTIC PO) Take 2 capsules by mouth daily.     promethazine  (PHENERGAN ) 12.5 MG tablet Take 1 tablet (12.5 mg total) by mouth every 6 (six) hours as needed for nausea or vomiting. 30 tablet 0   rosuvastatin  (CRESTOR ) 5 MG tablet TAKE 1 TABLET BY MOUTH DAILY 90 tablet 3   traMADol  (ULTRAM ) 50 MG tablet Take 1-2 tablets (50-100 mg total) by mouth 4 (four) times daily as needed for moderate pain (pain score  4-6). 30 tablet 0   No current facility-administered medications for this visit.    REVIEW OF SYSTEMS:   All  relevant systems were reviewed with the patient and are negative.  PHYSICAL EXAMINATION: ECOG PERFORMANCE STATUS: {CHL ONC ECOG PS:[phone]}  There were no vitals filed for this visit. There were no vitals filed for this visit.  GENERAL: alert, no distress and comfortable SKIN: skin color is normal, no jaundice, rashes or significant lesions EYES: sclera clear OROPHARYNX: no exudate, no erythema NECK: supple LYMPH:  no palpable lymphadenopathy in the cervical, axillary regions LUNGS: Effort normal, no respiratory distress.  Clear to auscultation bilaterally HEART: regular rate & rhythm and no lower extremity edema ABDOMEN: soft, non-tender and nondistended Musculoskeletal: no edema NEURO: no focal motor/sensory deficits  LABORATORY DATA:  I have reviewed the data as listed Lab Results  Component Value Date   WBC 17.8 (H) 05/30/2024   HGB 16.0 05/30/2024   HCT 49.5 05/30/2024   MCV 90.5 05/30/2024   PLT 270 05/30/2024   Recent Labs    01/11/24 1013 05/22/24 0836 05/30/24 0433  NA 140 138 141  K 4.6 4.6 4.1  CL 105 103 104  CO2 22 24 26   GLUCOSE 97 96 111*  BUN 28* 21 13  CREATININE 1.12 1.08 1.49*  CALCIUM  9.9 9.9 9.6  GFRNONAA  --  >60 50*  PROT 6.5  --   --   AST 14  --   --   ALT 12  --   --   BILITOT 0.5  --   --     RADIOGRAPHIC STUDIES: I have personally reviewed the radiological images as listed and agreed with the findings in the report. DG Abd 1 View Result Date: 05/30/2024 EXAM: 1 VIEW XRAY OF THE ABDOMEN 05/30/2024 01:16:00 AM COMPARISON: ct abd/pelvis 05/13/24 CLINICAL HISTORY: [address]12[address] Ileus (HCC) 801250. Chronic hiccups. FINDINGS: LINES, TUBES AND DEVICES: Catheter overlies the pelvis. BOWEL: Nonobstructive bowel gas pattern. SOFT TISSUES: No opaque urinary calculi. BONES: No acute osseous abnormality. IMPRESSION: 1. No acute abdominal abnormality. Electronically signed by: Morgane Naveau MD 05/30/2024 01:38 AM EDT RP Workstation: [id number]

## 2024-06-18 ENCOUNTER — Inpatient Hospital Stay

## 2024-06-18 ENCOUNTER — Telehealth: Payer: Self-pay | Admitting: *Deleted

## 2024-06-18 VITALS — BP 140/90 | HR 98 | Temp 97.3°F | Resp 18 | Wt 153.4 lb

## 2024-06-18 DIAGNOSIS — C61 Malignant neoplasm of prostate: Secondary | ICD-10-CM | POA: Insufficient documentation

## 2024-06-18 DIAGNOSIS — C642 Malignant neoplasm of left kidney, except renal pelvis: Secondary | ICD-10-CM

## 2024-06-18 DIAGNOSIS — Z905 Acquired absence of kidney: Secondary | ICD-10-CM | POA: Insufficient documentation

## 2024-06-18 DIAGNOSIS — Z87891 Personal history of nicotine dependence: Secondary | ICD-10-CM | POA: Insufficient documentation

## 2024-06-18 DIAGNOSIS — Z8042 Family history of malignant neoplasm of prostate: Secondary | ICD-10-CM | POA: Insufficient documentation

## 2024-06-18 DIAGNOSIS — Z85528 Personal history of other malignant neoplasm of kidney: Secondary | ICD-10-CM | POA: Diagnosis present

## 2024-06-18 DIAGNOSIS — Z803 Family history of malignant neoplasm of breast: Secondary | ICD-10-CM | POA: Diagnosis not present

## 2024-06-18 DIAGNOSIS — Z800 Family history of malignant neoplasm of digestive organs: Secondary | ICD-10-CM | POA: Diagnosis not present

## 2024-06-18 NOTE — Telephone Encounter (Signed)
-----   Message from Pauletta JAYSON Chihuahua sent at 06/18/2024 11:17 AM EST ----- Zorita would you fax referral to [facility]? Thanks

## 2024-06-18 NOTE — Telephone Encounter (Signed)
 New patient referral faxed to Dr Prentice Croft. (P) [phone], (F) [phone]

## 2024-06-18 NOTE — Assessment & Plan Note (Signed)
 Obtain CT chest continue H&P, with CMP, CBC every 3 to 6 months for 3 years, and annually after 5 years and as clinically indicated. CT abdomen and pelvis with chest every 6 months for 3 years, annually for up to 5 years, and then as clinically indicated. If any questionable lung nodule that is nondiagnostic, should repeat in 3 months Will refer for second opinion I will communicate with urology for follow-up Will follow-up with us  as needed Will communicate with pathology results from second opinion

## 2024-06-18 NOTE — Assessment & Plan Note (Addendum)
 Recommend adequate hydration at 64 ounces daily Patient already have been referred to nephrology.

## 2024-06-19 ENCOUNTER — Other Ambulatory Visit: Payer: Self-pay | Admitting: Physician Assistant

## 2024-06-19 ENCOUNTER — Other Ambulatory Visit: Payer: Self-pay | Admitting: Family Medicine

## 2024-08-21 LAB — LAB REPORT - SCANNED
Albumin, Urine POC: 3.7
Creatinine, POC: 92.9 mg/dL
Microalb Creat Ratio: 4

## 2024-09-17 ENCOUNTER — Ambulatory Visit: Admitting: Family Medicine

## 2025-01-14 ENCOUNTER — Ambulatory Visit: Admitting: Family Medicine
